# Patient Record
Sex: Male | Born: 1945 | Race: White | Hispanic: No | Marital: Married | Smoking: Former smoker
Health system: Southern US, Community
[De-identification: ages and names within clinical notes are randomized; demographics above are authoritative.]

## PROBLEM LIST (undated history)

## (undated) DIAGNOSIS — C61 Malignant neoplasm of prostate: Secondary | ICD-10-CM

## (undated) DIAGNOSIS — I959 Hypotension, unspecified: Secondary | ICD-10-CM

## (undated) DIAGNOSIS — R55 Syncope and collapse: Secondary | ICD-10-CM

## (undated) DIAGNOSIS — N289 Disorder of kidney and ureter, unspecified: Secondary | ICD-10-CM

## (undated) DIAGNOSIS — K900 Celiac disease: Secondary | ICD-10-CM

## (undated) DIAGNOSIS — E860 Dehydration: Secondary | ICD-10-CM

## (undated) DIAGNOSIS — S22089A Unspecified fracture of T11-T12 vertebra, initial encounter for closed fracture: Secondary | ICD-10-CM

## (undated) DIAGNOSIS — I272 Pulmonary hypertension, unspecified: Secondary | ICD-10-CM

## (undated) DIAGNOSIS — E785 Hyperlipidemia, unspecified: Secondary | ICD-10-CM

## (undated) DIAGNOSIS — E7800 Pure hypercholesterolemia, unspecified: Secondary | ICD-10-CM

## (undated) DIAGNOSIS — I10 Essential (primary) hypertension: Secondary | ICD-10-CM

## (undated) DIAGNOSIS — J45909 Unspecified asthma, uncomplicated: Secondary | ICD-10-CM

## (undated) DIAGNOSIS — S2242XA Multiple fractures of ribs, left side, initial encounter for closed fracture: Secondary | ICD-10-CM

## (undated) DIAGNOSIS — M4317 Spondylolisthesis, lumbosacral region: Secondary | ICD-10-CM

## (undated) DIAGNOSIS — R5383 Other fatigue: Secondary | ICD-10-CM

## (undated) DIAGNOSIS — M51369 Other intervertebral disc degeneration, lumbar region without mention of lumbar back pain or lower extremity pain: Secondary | ICD-10-CM

## (undated) DIAGNOSIS — S22009A Unspecified fracture of unspecified thoracic vertebra, initial encounter for closed fracture: Secondary | ICD-10-CM

## (undated) DIAGNOSIS — Z974 Presence of external hearing-aid: Secondary | ICD-10-CM

## (undated) DIAGNOSIS — M4316 Spondylolisthesis, lumbar region: Secondary | ICD-10-CM

## (undated) DIAGNOSIS — M5416 Radiculopathy, lumbar region: Secondary | ICD-10-CM

## (undated) DIAGNOSIS — R972 Elevated prostate specific antigen [PSA]: Secondary | ICD-10-CM

## (undated) DIAGNOSIS — G709 Myoneural disorder, unspecified: Secondary | ICD-10-CM

## (undated) DIAGNOSIS — I5189 Other ill-defined heart diseases: Secondary | ICD-10-CM

## (undated) DIAGNOSIS — I519 Heart disease, unspecified: Secondary | ICD-10-CM

## (undated) DIAGNOSIS — Z87442 Personal history of urinary calculi: Secondary | ICD-10-CM

## (undated) DIAGNOSIS — K219 Gastro-esophageal reflux disease without esophagitis: Secondary | ICD-10-CM

## (undated) DIAGNOSIS — S3992XA Unspecified injury of lower back, initial encounter: Secondary | ICD-10-CM

## (undated) DIAGNOSIS — M5136 Other intervertebral disc degeneration, lumbar region: Secondary | ICD-10-CM

## (undated) HISTORY — PX: COLONOSCOPY: SHX174

## (undated) HISTORY — PX: HERNIA REPAIR: SHX51

---
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
## 1999-11-14 ENCOUNTER — Encounter: Admission: RE | Admit: 1999-11-14 | Discharge: 1999-11-14 | Payer: Self-pay | Admitting: Family Medicine

## 1999-11-14 ENCOUNTER — Encounter: Payer: Self-pay | Admitting: Family Medicine

## 2000-12-19 ENCOUNTER — Encounter: Payer: Self-pay | Admitting: *Deleted

## 2000-12-19 ENCOUNTER — Encounter (INDEPENDENT_AMBULATORY_CARE_PROVIDER_SITE_OTHER): Payer: Self-pay | Admitting: Specialist

## 2000-12-19 ENCOUNTER — Ambulatory Visit (HOSPITAL_COMMUNITY): Admission: RE | Admit: 2000-12-19 | Discharge: 2000-12-19 | Payer: Self-pay | Admitting: *Deleted

## 2003-01-04 ENCOUNTER — Ambulatory Visit (HOSPITAL_COMMUNITY): Admission: RE | Admit: 2003-01-04 | Discharge: 2003-01-04 | Payer: Self-pay | Admitting: *Deleted

## 2004-05-14 HISTORY — PX: BACK SURGERY: SHX140

## 2005-06-29 ENCOUNTER — Encounter: Admission: RE | Admit: 2005-06-29 | Discharge: 2005-06-29 | Payer: Self-pay | Admitting: Family Medicine

## 2005-08-10 ENCOUNTER — Encounter: Admission: RE | Admit: 2005-08-10 | Discharge: 2005-08-10 | Payer: Self-pay | Admitting: Neurosurgery

## 2005-08-31 ENCOUNTER — Encounter: Admission: RE | Admit: 2005-08-31 | Discharge: 2005-08-31 | Payer: Self-pay | Admitting: Neurosurgery

## 2005-09-28 ENCOUNTER — Observation Stay (HOSPITAL_COMMUNITY): Admission: RE | Admit: 2005-09-28 | Discharge: 2005-09-29 | Payer: Self-pay | Admitting: Neurosurgery

## 2006-11-12 ENCOUNTER — Ambulatory Visit: Payer: Self-pay | Admitting: Vascular Surgery

## 2007-01-09 ENCOUNTER — Ambulatory Visit: Payer: Self-pay | Admitting: *Deleted

## 2007-01-29 ENCOUNTER — Encounter: Admission: RE | Admit: 2007-01-29 | Discharge: 2007-01-29 | Payer: Self-pay | Admitting: Gastroenterology

## 2007-08-01 ENCOUNTER — Emergency Department (HOSPITAL_COMMUNITY): Admission: EM | Admit: 2007-08-01 | Discharge: 2007-08-01 | Payer: Self-pay | Admitting: Emergency Medicine

## 2008-08-28 ENCOUNTER — Encounter: Admission: RE | Admit: 2008-08-28 | Discharge: 2008-08-28 | Payer: Self-pay | Admitting: Family Medicine

## 2010-09-26 NOTE — Procedures (Signed)
CAROTID DUPLEX EXAM   INDICATION:  Follow-up evaluation of known carotid artery disease.   HISTORY:  Diabetes:  No.  Cardiac:  No.  Hypertension:  No.  Smoking:  Quit in 1971.  Previous Surgery:  No.  CV History:  Left carotid bruit.  Amaurosis Fugax:  No.  Paresthesias:  No.  Hemiparesis:  No.                                       RIGHT             LEFT  Brachial systolic pressure:         132               126  Brachial Doppler waveforms:         Triphasic         Triphasic  Vertebral direction of flow:        Antegrade         Antegrade  DUPLEX VELOCITIES (cm/sec)  CCA peak systolic                   79                78  ECA peak systolic                   146               467  ICA peak systolic                   89                103  ICA end diastolic                   35                42  PLAQUE MORPHOLOGY:                  Calcified         Calcified  PLAQUE AMOUNT:                      Mild to moderate  Severe  PLAQUE LOCATION:                    Proximal ICA      Proximal ECA   IMPRESSION:  1. Left external carotid artery stenosis.  2. There was 20%-39% right internal carotid artery stenosis.  3. There was 40%-59% left internal carotid artery stenosis.   ___________________________________________  P. Liliane Bade, M.D.   MC/MEDQ  D:  11/12/2006  T:  11/13/2006  Job:  [id number]

## 2010-09-26 NOTE — Consult Note (Signed)
NEW PATIENT CONSULTATION   Cox, Alexander VILLENA  DOB:  07-31-1945                                       01/09/2007  CHART#:[id number]   REASON FOR CONSULTATION:  Extracranial cerebrovascular occlusive  disease.   HISTORY:  Alexander Cox is a 65 year old male, who underwent a  carotid Doppler evaluation for followup of known carotid artery  occlusive disease, carried out November 12, 2006.  He states that three or  four years ago, he had some episodes of numbness on the right side of  his face.  Carotid Doppler at that time did reveal some plaque.  He was  placed on aspirin daily.  He has had no further episodes.  Underwent  follow-up Doppler evaluation recently.   No history of stroke.  Denies sensory or motor deficit.  No visual  disturbance.   Carotid Doppler evaluation carried out at [facility] office reveals a mild 20%  to 39% right ICA stenosis and moderate 40% to 59% left ICA stenosis.  Mild plaque was noted in the right carotid bifurcation, and severe  calcified plaque in the left carotid bifurcation.  Did have a high-grade  left external carotid stenosis.   PAST MEDICAL HISTORY:  1. BPH.  2. Abnormal liver function tests.   MEDICATIONS:  Aspirin 325 mg daily.   ALLERGIES:  None known.   FAMILY HISTORY:  Mother died, age 73, of abdominal cancer.  Father died  at age 40 with Alzheimer's disease.  Two siblings living, age 56 and 16,  who are generally well.   SOCIAL HISTORY:  The patient is married with two grown children.  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  Does not  use tobacco.  Discontinued cigarettes 30 years ago.  No alcohol intake.   REVIEW OF SYSTEMS:  Denies weight loss or anorexia.  No chest pain or  shortness of breath.  Denies cough or sputum production.  No change in  bowel habits.  He does have some mild urinary frequency.  Notes some  joint discomfort and dizziness.   PHYSICAL EXAMINATION:  GENERAL:   Well-appearing 65 year old male.  VITAL SIGNS:  BP 111/72, left arm, 113/70 right arm.  Pulse 78 per  minutes, respirations 18 per minute.  HEENT:  Mouth and throat are clear.  Normocephalic.  Extraocular  movements intact.  NECK:  Supple, no thyromegaly or adenopathy.  CHEST:  Equal air entry bilaterally without rales or rhonchi.  CARDIOVASCULAR:  Left carotid bruits.  HEART:  Sounds are normal without murmurs.  ABDOMEN:  Soft, nontender.  LOWER EXTREMITIES:  No ankle edema.  NEUROLOGIC:  Cranial nerves intact.  Strength equal bilaterally.  Reflexes 2+.   IMPRESSION:  1. Mild to moderate asymptomatic bilateral carotid disease.  2. Benign prostatic hypertrophy.  3. Abnormal liver function tests.   RECOMMENDATIONS:  Continue aspirin 325 mg daily.  Return in 6 months for  follow-up carotid Doppler evaluation.   Balinda Quails, M.D.  Electronically Signed   [facility]/MEDQ  D:  01/09/2007  T:  01/11/2007  Job:  [id number]

## 2010-09-29 NOTE — Op Note (Signed)
NAME:  [name], [name] NO.:  [id number]   MEDICAL RECORD NO.:  [id number]          PATIENT TYPE:  INP   LOCATION:  2899                         FACILITY:  [facility]   PHYSICIAN:  Coletta Memos, M.D.     DATE OF BIRTH:  08-15-1945   DATE OF PROCEDURE:  09/28/2005  DATE OF DISCHARGE:                                 OPERATIVE REPORT   PREOPERATIVE DIAGNOSIS:  1.  Left L3 left L4 radiculopathies.  2.  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   POSTOPERATIVE DIAGNOSES:  1.  Left L3 left L4 radiculopathies.  2.  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   PROCEDURE:  Far lateral foraminotomies via superior facetectomies at L3-L4  and at L4-L5 on the left side with microscopic dissection.   COMPLICATIONS:  None.   SURGEON:  Coletta Memos, M.D.   ASSISTANT:  Stefani Dama, M.D.   ANESTHESIA:  General endotracheal.   INDICATIONS:  Alexander Cox is a 65 year old who had pain in the left lower  extremity.  MRI revealed lateral recess and foraminal stenosis at L3-L4 and  at L4-L5, both on the left side.  I, therefore, recommended and he agreed to  undergo operative decompression.  He is admitted today for said operation.   OPERATIVE NOTE:  Alexander Cox was brought to the operating room, intubated,  and placed under general anesthetic without difficulty.  He was rolled prone  onto a Wilson frame and all pressure points were properly padded.  His back  was prepped and he was draped in a sterile fashion using DuraPrep.  I  infiltrated 20 mL 0.5% lidocaine with 1:200,000 epinephrine into the lumbar  region and the paraspinous musculature on the left side.  Using a  preoperative localizing film, I then opened the skin with a #10 blade and  took this down to the thoracolumbar fascia.  I exposed lamina of L2, L3, and  L4.  I placed a double ended ganglion knife underneath the lamina of L3.  X-  ray showed that I was in the L3-L4 interlaminar space.  I then  moved up to  approach the pars interarticularis of L3.  I placed a self-retaining  retractor and identified the pars.  Then I used a high-speed drill to drill  out the lateral portion of the pars and performed a superior facetectomy on  the left side at L3-L4.  I brought the microscope into the operative field  and then removed ligamentum flavum.  I was able to identified the L3 nerve  root.  With Dr. Verlee Rossetti assistance, we then decompressed the L3 nerve root  using Kerrison punches and a high speed drill to remove what was very  thickened ligamentum flavum and also removed bony protuberances from the  facet at L3-L4.  The disk space was also identified.  I did not enter the  disk space as the nerve root was well decompressed after removing the bone  and ligament.  The pedicle was also easily identified and the nerve root was  clearly free in its egress from the neural foramen on  left side.   I then turned my attention to the L4 nerve root and found the pars  articularis of the L4 vertebral body on the left side.  I then used a high  speed drill and we drilled out the lateral portion and also the facette  superiorly there.  I removed bone and ligament until I was able to identify  the nerve root with microscopic dissection.  I then drilled a little bit  more into the pars to remove what was very thickened ligamentum flavum  compressing the L4 nerve root.  I then removed that with a Kerrison punch  and also removed the superior facet with the Kerrison punch.  After  thoroughly decompressing the nerve root with Dr. Verlee Rossetti assistance, I  irrigated.  Hemostasis was obtained.  I then closed the wound in a layered  fashion using Vicryl sutures to reapproximate the thoracolumbar fascia,  subcutaneous tissue and subcuticular layers.  Dermabond was used for a  sterile dressing.  The patient tolerated procedure well.           ______________________________  Coletta Memos, M.D.     [facility]/MEDQ   D:  09/28/2005  T:  09/28/2005  Job:  [id number]

## 2011-02-05 LAB — CBC
Platelets: 282
RBC: 5.04
RDW: 18.7 — ABNORMAL HIGH
WBC: 12.1 — ABNORMAL HIGH

## 2011-02-05 LAB — URINE MICROSCOPIC-ADD ON

## 2011-02-05 LAB — URINALYSIS, ROUTINE W REFLEX MICROSCOPIC
Bilirubin Urine: NEGATIVE
Glucose, UA: NEGATIVE
Ketones, ur: NEGATIVE
Nitrite: NEGATIVE
Protein, ur: NEGATIVE

## 2011-02-05 LAB — COMPREHENSIVE METABOLIC PANEL
ALT: 30
Alkaline Phosphatase: 113
Calcium: 8.7
Creatinine, Ser: 1.46
GFR calc Af Amer: 44 — ABNORMAL LOW
Potassium: 3.8
Total Bilirubin: 0.4

## 2011-02-05 LAB — LIPASE, BLOOD: Lipase: 22

## 2011-02-05 LAB — DIFFERENTIAL
Basophils Absolute: 0.1
Eosinophils Relative: 1
Lymphocytes Relative: 12
Lymphs Abs: 1.5
Monocytes Relative: 6
Neutrophils Relative %: 79 — ABNORMAL HIGH

## 2011-10-09 ENCOUNTER — Other Ambulatory Visit: Payer: Self-pay | Admitting: Family Medicine

## 2011-10-12 ENCOUNTER — Ambulatory Visit
Admission: RE | Admit: 2011-10-12 | Discharge: 2011-10-12 | Disposition: A | Discharge status: Home / Self Care | Payer: Medicare Other | Source: Ambulatory Visit | Attending: Family Medicine | Admitting: Family Medicine

## 2012-05-14 DIAGNOSIS — S3992XA Unspecified injury of lower back, initial encounter: Secondary | ICD-10-CM

## 2012-05-14 HISTORY — DX: Unspecified injury of lower back, initial encounter: S39.92XA

## 2013-06-25 ENCOUNTER — Encounter (HOSPITAL_COMMUNITY): Payer: Self-pay | Admitting: Emergency Medicine

## 2013-06-25 DIAGNOSIS — Z79899 Other long term (current) drug therapy: Secondary | ICD-10-CM | POA: Insufficient documentation

## 2013-06-25 DIAGNOSIS — I10 Essential (primary) hypertension: Secondary | ICD-10-CM | POA: Insufficient documentation

## 2013-06-25 DIAGNOSIS — R51 Headache: Secondary | ICD-10-CM | POA: Insufficient documentation

## 2013-06-25 DIAGNOSIS — Z7982 Long term (current) use of aspirin: Secondary | ICD-10-CM | POA: Insufficient documentation

## 2013-06-25 DIAGNOSIS — Z8719 Personal history of other diseases of the digestive system: Secondary | ICD-10-CM | POA: Insufficient documentation

## 2013-06-25 LAB — CBC WITH DIFFERENTIAL/PLATELET
Basophils Absolute: 0 10*3/uL (ref 0.0–0.1)
Basophils Relative: 0 % (ref 0–1)
EOS ABS: 0.2 10*3/uL (ref 0.0–0.7)
EOS PCT: 3 % (ref 0–5)
HCT: 43.6 % (ref 39.0–52.0)
Hemoglobin: 14.9 g/dL (ref 13.0–17.0)
LYMPHS ABS: 2.8 10*3/uL (ref 0.7–4.0)
LYMPHS PCT: 35 % (ref 12–46)
MCH: 30.7 pg (ref 26.0–34.0)
MCHC: 34.2 g/dL (ref 30.0–36.0)
MCV: 89.7 fL (ref 78.0–100.0)
MONOS PCT: 7 % (ref 3–12)
Monocytes Absolute: 0.5 10*3/uL (ref 0.1–1.0)
NEUTROS PCT: 55 % (ref 43–77)
Neutro Abs: 4.4 10*3/uL (ref 1.7–7.7)
PLATELETS: 226 10*3/uL (ref 150–400)
RBC: 4.86 MIL/uL (ref 4.22–5.81)
RDW: 12.7 % (ref 11.5–15.5)
WBC: 8 10*3/uL (ref 4.0–10.5)

## 2013-06-25 LAB — COMPREHENSIVE METABOLIC PANEL
ALT: 24 U/L (ref 0–53)
AST: 31 U/L (ref 0–37)
Albumin: 3.9 g/dL (ref 3.5–5.2)
Alkaline Phosphatase: 80 U/L (ref 39–117)
BUN: 20 mg/dL (ref 6–23)
CALCIUM: 9 mg/dL (ref 8.4–10.5)
CHLORIDE: 103 meq/L (ref 96–112)
CO2: 27 meq/L (ref 19–32)
CREATININE: 1.35 mg/dL (ref 0.50–1.35)
GFR calc Af Amer: 62 mL/min — ABNORMAL LOW (ref >90)
GFR calc non Af Amer: 53 mL/min — ABNORMAL LOW (ref >90)
Glucose, Bld: 93 mg/dL (ref 70–99)
POTASSIUM: 3.9 meq/L (ref 3.7–5.3)
Sodium: 144 mEq/L (ref 137–147)
Total Bilirubin: 0.2 mg/dL — ABNORMAL LOW (ref 0.3–1.2)
Total Protein: 6.9 g/dL (ref 6.0–8.3)

## 2013-06-25 NOTE — ED Notes (Signed)
Pt. reports elevated blood pressure at home this evening 190/105 with mild headache . No nausea or blurred vision . Currently taking antihypertensive medications .

## 2013-06-26 ENCOUNTER — Emergency Department (HOSPITAL_COMMUNITY)
Admission: EM | Admit: 2013-06-26 | Discharge: 2013-06-26 | Disposition: A | Discharge status: Home / Self Care | Payer: Medicare Other | Attending: Emergency Medicine | Admitting: Emergency Medicine

## 2013-06-26 DIAGNOSIS — I10 Essential (primary) hypertension: Secondary | ICD-10-CM

## 2013-06-26 HISTORY — DX: Celiac disease: K90.0

## 2013-06-26 HISTORY — DX: Essential (primary) hypertension: I10

## 2013-06-26 MED ORDER — ONDANSETRON HCL 4 MG/2ML IJ SOLN
4.0000 mg | Freq: Once | INTRAMUSCULAR | Status: AC
  Administered 2013-06-26: 4 mg via INTRAVENOUS
  Filled 2013-06-26: qty 2

## 2013-06-26 MED ORDER — FENTANYL CITRATE 0.05 MG/ML IJ SOLN
50.0000 ug | INTRAMUSCULAR | Status: DC | PRN
  Administered 2013-06-26: 50 ug via INTRAVENOUS
  Filled 2013-06-26: qty 2

## 2013-06-26 NOTE — ED Notes (Signed)
EKG handed to Dr. Marnette Burgess.

## 2013-06-26 NOTE — Discharge Instructions (Signed)
Arterial Hypertension °Arterial hypertension (high blood pressure) is a condition of elevated pressure in your blood vessels. Hypertension over a long period of time is a risk factor for strokes, heart attacks, and heart failure. It is also the leading cause of kidney (renal) failure.  °CAUSES  °· In Adults -- Over 90% of all hypertension has no known cause. This is called essential or primary hypertension. In the other 10% of people with hypertension, the increase in blood pressure is caused by another disorder. This is called secondary hypertension. Important causes of secondary hypertension are: °· Heavy alcohol use. °· Obstructive sleep apnea. °· Hyperaldosterosim (Conn's syndrome). °· Steroid use. °· Chronic kidney failure. °· Hyperparathyroidism. °· Medications. °· Renal artery stenosis. °· Pheochromocytoma. °· Cushing's disease. °· Coarctation of the aorta. °· Scleroderma renal crisis. °· Licorice (in excessive amounts). °· Drugs (cocaine, methamphetamine). °Your caregiver can explain any items above that apply to you. °· In Children -- Secondary hypertension is more common and should always be considered. °· Pregnancy -- Few women of childbearing age have high blood pressure. However, up to 10% of them develop hypertension of pregnancy. Generally, this will not harm the woman. It may be a sign of 3 complications of pregnancy: preeclampsia, HELLP syndrome, and eclampsia. Follow up and control with medication is necessary. °SYMPTOMS  °· This condition normally does not produce any noticeable symptoms. It is usually found during a routine exam. °· Malignant hypertension is a late problem of high blood pressure. It may have the following symptoms: °· Headaches. °· Blurred vision. °· End-organ damage (this means your kidneys, heart, lungs, and other organs are being damaged). °· Stressful situations can increase the blood pressure. If a person with normal blood pressure has their blood pressure go up while being  seen by their caregiver, this is often termed "white coat hypertension." Its importance is not known. It may be related with eventually developing hypertension or complications of hypertension. °· Hypertension is often confused with mental tension, stress, and anxiety. °DIAGNOSIS  °The diagnosis is made by 3 separate blood pressure measurements. They are taken at least 1 week apart from each other. If there is organ damage from hypertension, the diagnosis may be made without repeat measurements. °Hypertension is usually identified by having blood pressure readings: °· Above 140/90 mmHg measured in both arms, at 3 separate times, over a couple weeks. °· Over 130/80 mmHg should be considered a risk factor and may require treatment in patients with diabetes. °Blood pressure readings over 120/80 mmHg are called "pre-hypertension" even in non-diabetic patients. °To get a true blood pressure measurement, use the following guidelines. Be aware of the factors that can alter blood pressure readings. °· Take measurements at least 1 hour after caffeine. °· Take measurements 30 minutes after smoking and without any stress. This is another reason to quit smoking  it raises your blood pressure. °· Use a proper cuff size. Ask your caregiver if you are not sure about your cuff size. °· Most home blood pressure cuffs are automatic. They will measure systolic and diastolic pressures. The systolic pressure is the pressure reading at the start of sounds. Diastolic pressure is the pressure at which the sounds disappear. If you are elderly, measure pressures in multiple postures. Try sitting, lying or standing. °· Sit at rest for a minimum of 5 minutes before taking measurements. °· You should not be on any medications like decongestants. These are found in many cold medications. °· Record your blood pressure readings and review   them with your caregiver. °If you have hypertension: °· Your caregiver may do tests to be sure you do not have  secondary hypertension (see "causes" above). °· Your caregiver may also look for signs of metabolic syndrome. This is also called Syndrome X or Insulin Resistance Syndrome. You may have this syndrome if you have type 2 diabetes, abdominal obesity, and abnormal blood lipids in addition to hypertension. °· Your caregiver will take your medical and family history and perform a physical exam. °· Diagnostic tests may include blood tests (for glucose, cholesterol, potassium, and kidney function), a urinalysis, or an EKG. Other tests may also be necessary depending on your condition. °PREVENTION  °There are important lifestyle issues that you can adopt to reduce your chance of developing hypertension: °· Maintain a normal weight. °· Limit the amount of salt (sodium) in your diet. °· Exercise often. °· Limit alcohol intake. °· Get enough potassium in your diet. Discuss specific advice with your caregiver. °· Follow a DASH diet (dietary approaches to stop hypertension). This diet is rich in fruits, vegetables, and low-fat dairy products, and avoids certain fats. °PROGNOSIS  °Essential hypertension cannot be cured. Lifestyle changes and medical treatment can lower blood pressure and reduce complications. The prognosis of secondary hypertension depends on the underlying cause. Many people whose hypertension is controlled with medicine or lifestyle changes can live a normal, healthy life.  °RISKS AND COMPLICATIONS  °While high blood pressure alone is not an illness, it often requires treatment due to its short- and long-term effects on many organs. Hypertension increases your risk for: °· CVAs or strokes (cerebrovascular accident). °· Heart failure due to chronically high blood pressure (hypertensive cardiomyopathy). °· Heart attack (myocardial infarction). °· Damage to the retina (hypertensive retinopathy). °· Kidney failure (hypertensive nephropathy). °Your caregiver can explain list items above that apply to you. Treatment  of hypertension can significantly reduce the risk of complications. °TREATMENT  °· For overweight patients, weight loss and regular exercise are recommended. Physical fitness lowers blood pressure. °· Mild hypertension is usually treated with diet and exercise. A diet rich in fruits and vegetables, fat-free dairy products, and foods low in fat and salt (sodium) can help lower blood pressure. Decreasing salt intake decreases blood pressure in a 1/3 of people. °· Stop smoking if you are a smoker. °The steps above are highly effective in reducing blood pressure. While these actions are easy to suggest, they are difficult to achieve. Most patients with moderate or severe hypertension end up requiring medications to bring their blood pressure down to a normal level. There are several classes of medications for treatment. Blood pressure pills (antihypertensives) will lower blood pressure by their different actions. Lowering the blood pressure by 10 mmHg may decrease the risk of complications by as much as 25%. °The goal of treatment is effective blood pressure control. This will reduce your risk for complications. Your caregiver will help you determine the best treatment for you according to your lifestyle. What is excellent treatment for one person, may not be for you. °HOME CARE INSTRUCTIONS  °· Do not smoke. °· Follow the lifestyle changes outlined in the "Prevention" section. °· If you are on medications, follow the directions carefully. Blood pressure medications must be taken as prescribed. Skipping doses reduces their benefit. It also puts you at risk for problems. °· Follow up with your caregiver, as directed. °· If you are asked to monitor your blood pressure at home, follow the guidelines in the "Diagnosis" section above. °SEEK MEDICAL CARE   IF:   You think you are having medication side effects.  You have recurrent headaches or lightheadedness.  You have swelling in your ankles.  You have trouble with  your vision. SEEK IMMEDIATE MEDICAL CARE IF:   You have sudden onset of chest pain or pressure, difficulty breathing, or other symptoms of a heart attack.  You have a severe headache.  You have symptoms of a stroke (such as sudden weakness, difficulty speaking, difficulty walking). MAKE SURE YOU:   Understand these instructions.  Will watch your condition. DASH Diet The DASH diet stands for "Dietary Approaches to Stop Hypertension." It is a healthy eating plan that has been shown to reduce high blood pressure (hypertension) in as little as 14 days, while also possibly providing other significant health benefits. These other health benefits include reducing the risk of breast cancer after menopause and reducing the risk of type 2 diabetes, heart disease, colon cancer, and stroke. Health benefits also include weight loss and slowing kidney failure in patients with chronic kidney disease.  DIET GUIDELINES Limit salt (sodium). Your diet should contain less than 1500 mg of sodium daily. Limit refined or processed carbohydrates. Your diet should include mostly whole grains. Desserts and added sugars should be used sparingly. Include small amounts of heart-healthy fats. These types of fats include nuts, oils, and tub margarine. Limit saturated and trans fats. These fats have been shown to be harmful in the body. CHOOSING FOODS  The following food groups are based on a 2000 calorie diet. See your Registered Dietitian for individual calorie needs. Grains and Grain Products (6 to 8 servings daily) Eat More Often: Whole-wheat bread, brown rice, whole-grain or wheat pasta, quinoa, popcorn without added fat or salt (air popped). Eat Less Often: White bread, white pasta, white rice, cornbread. Vegetables (4 to 5 servings daily) Eat More Often: Fresh, frozen, and canned vegetables. Vegetables may be raw, steamed, roasted, or grilled with a minimal amount of fat. Eat Less Often/Avoid: Creamed or fried  vegetables. Vegetables in a cheese sauce. Fruit (4 to 5 servings daily) Eat More Often: All fresh, canned (in natural juice), or frozen fruits. Dried fruits without added sugar. One hundred percent fruit juice ( cup [237 mL] daily). Eat Less Often: Dried fruits with added sugar. Canned fruit in light or heavy syrup. [employer], Fish, and Poultry (2 servings or less daily. One serving is 3 to 4 oz [85-114 g]). Eat More Often: Ninety percent or leaner ground beef, tenderloin, sirloin. Round cuts of beef, chicken breast, [place] breast. All fish. Grill, bake, or broil your meat. Nothing should be fried. Eat Less Often/Avoid: Fatty cuts of meat, [place], or chicken leg, thigh, or wing. Fried cuts of meat or fish. Dairy (2 to 3 servings) Eat More Often: Low-fat or fat-free milk, low-fat plain or light yogurt, reduced-fat or part-skim cheese. Eat Less Often/Avoid: Milk (whole, 2%).Whole milk yogurt. Full-fat cheeses. Nuts, Seeds, and Legumes (4 to 5 servings per week) Eat More Often: All without added salt. Eat Less Often/Avoid: Salted nuts and seeds, canned beans with added salt. Fats and Sweets (limited) Eat More Often: Vegetable oils, tub margarines without trans fats, sugar-free gelatin. Mayonnaise and salad dressings. Eat Less Often/Avoid: Coconut oils, palm oils, butter, stick margarine, cream, half and half, cookies, candy, pie. FOR MORE INFORMATION The Dash Diet Eating Plan: www.dashdiet.org Document Released: 04/19/2011 Document Revised: 07/23/2011 Document Reviewed: 04/19/2011 [facility] Patient Information 2014 [place], [place].   Will get help right away if you are not doing well  or get worse. Document Released: 04/30/2005 Document Revised: 07/23/2011 Document Reviewed: 11/28/2006 [facility] Patient Information 2014 [place].

## 2013-06-27 NOTE — ED Provider Notes (Signed)
CSN: [id number]     Arrival date & time 06/25/13  2208 History   First MD Initiated Contact with Patient 06/26/13 0210     Chief Complaint  Patient presents with  . Hypertension  . Headache     (Consider location/radiation/quality/duration/timing/severity/associated sxs/prior Treatment) HPI History provided by patient. History of hypertension, taking medications as prescribed, developed mild headache at home tonight and check his blood pressure. It persisted to be elevated and he continued to check it. Highest numbers were in the range of systolic 932/355. Headache described as frontal and not radiating, pressure-like. moderate in severity. No associated weakness, numbness, speech difficulty, change in vision, trouble walking. No chest pain, shortness of breath, abdominal pain, nausea, vomiting. No difficulty urinating. No recent change in medications.  Past Medical History  Diagnosis Date  . Hypertension   . Celiac disease    History reviewed. No pertinent past surgical history. No family history on file. History  Substance Use Topics  . Smoking status: Never Smoker   . Smokeless tobacco: Not on file  . Alcohol Use: No    Review of Systems  Constitutional: Negative for fever and chills.  Eyes: Negative for visual disturbance.  Respiratory: Negative for shortness of breath.   Cardiovascular: Negative for chest pain.  Gastrointestinal: Negative for vomiting and abdominal pain.  Genitourinary: Negative for flank pain and decreased urine volume.  Musculoskeletal: Negative for back pain, neck pain and neck stiffness.  Skin: Negative for rash.  Neurological: Negative for headaches.  All other systems reviewed and are negative.      Allergies  Review of patient's allergies indicates no known allergies.  Home Medications   Current Outpatient Rx  Name  Route  Sig  Dispense  Refill  . aspirin EC 81 MG tablet   Oral   Take 81 mg by mouth daily.         . calcium  carbonate (OS-CAL) 600 MG TABS tablet   Oral   Take 600 mg by mouth daily with breakfast.         . cholecalciferol (VITAMIN D) 1000 UNITS tablet   Oral   Take 1,000 Units by mouth daily.         Marland Kitchen lisinopril (PRINIVIL,ZESTRIL) 20 MG tablet   Oral   Take 10 mg by mouth 2 (two) times daily.          . Multiple Vitamin (MULTIVITAMIN WITH MINERALS) TABS tablet   Oral   Take 1 tablet by mouth daily.         Marland Kitchen omega-3 acid ethyl esters (LOVAZA) 1 G capsule   Oral   Take 1 g by mouth daily.         . simvastatin (ZOCOR) 20 MG tablet   Oral   Take 20 mg by mouth daily.          BP 156/76  Pulse 54  Temp(Src) 98.2 F (36.8 C) (Oral)  Resp 11  Ht 5\' 7"  (1.702 m)  Wt 166 lb (75.297 kg)  BMI 25.99 kg/m2  SpO2 96% Physical Exam  Constitutional: He is oriented to person, place, and time. He appears well-developed and well-nourished.  HENT:  Head: Normocephalic and atraumatic.  Eyes: EOM are normal. Pupils are equal, round, and reactive to light.  Neck: Neck supple.  Cardiovascular: Normal rate, regular rhythm and intact distal pulses.   Pulmonary/Chest: Effort normal and breath sounds normal. No respiratory distress. He exhibits no tenderness.  Abdominal: Soft. Bowel sounds are normal. He exhibits  no distension. There is no tenderness.  Musculoskeletal: Normal range of motion. He exhibits no edema.  Neurological: He is alert and oriented to person, place, and time. He displays normal reflexes. No cranial nerve deficit. Coordination normal.  Speech clear, no facial droop, no pronator drift. Equal strengths: Grips, biceps, triceps, dorsi plantar flexion. Normal gait  Skin: Skin is warm and dry.    ED Course  Procedures (including critical care time) Labs Review Labs Reviewed  COMPREHENSIVE METABOLIC PANEL - Abnormal; Notable for the following:    Total Bilirubin 0.2 (*)    GFR calc non Af Amer 53 (*)    GFR calc Af Amer 62 (*)    All other components within  normal limits  CBC WITH DIFFERENTIAL   Imaging Review No results found.  EKG Interpretation    Date/Time:  Friday June 26 2013 02:55:03 EST Ventricular Rate:  50 PR Interval:  139 QRS Duration: 84 QT Interval:  488 QTC Calculation: 445 R Axis:   53 Text Interpretation:  Sinus bradycardia (less than 50 BPm) No significant change since last tracing Confirmed by Isley Weisheit  MD, Littleton Haub ([id number]) on 06/26/2013 4:18:51 AM           IV fentanyl provided for headache and cardiac monitoring  On recheck blood pressure improved. Headache resolved. Repeat exam unchanged.   Patient is comfortable with plan discharge home, take medications as prescribed and followup primary care physician for recheck blood pressure in the office. Hypertension precautions provided verbalized is understood. MDM   Final diagnoses:  Hypertension   Evaluated with EKG and labs reviewed as above, normal creatinine. Headache improved with IV fentanyl and blood pressure improving   Teressa Lower, MD 06/27/13 [phone]

## 2013-11-30 ENCOUNTER — Emergency Department (HOSPITAL_COMMUNITY): Payer: Medicare Other

## 2013-11-30 ENCOUNTER — Inpatient Hospital Stay (HOSPITAL_COMMUNITY)
Admission: EM | Admit: 2013-11-30 | Discharge: 2013-12-04 | DRG: 552 | Disposition: A | Discharge status: Rehabilitation Facility | Payer: Medicare Other | Attending: General Surgery | Admitting: General Surgery

## 2013-11-30 ENCOUNTER — Encounter (HOSPITAL_COMMUNITY): Payer: Self-pay | Admitting: Emergency Medicine

## 2013-11-30 DIAGNOSIS — N1831 Chronic kidney disease, stage 3a: Secondary | ICD-10-CM | POA: Diagnosis present

## 2013-11-30 DIAGNOSIS — I959 Hypotension, unspecified: Secondary | ICD-10-CM

## 2013-11-30 DIAGNOSIS — E785 Hyperlipidemia, unspecified: Secondary | ICD-10-CM | POA: Diagnosis present

## 2013-11-30 DIAGNOSIS — S22009A Unspecified fracture of unspecified thoracic vertebra, initial encounter for closed fracture: Secondary | ICD-10-CM | POA: Diagnosis not present

## 2013-11-30 DIAGNOSIS — N189 Chronic kidney disease, unspecified: Secondary | ICD-10-CM | POA: Diagnosis present

## 2013-11-30 DIAGNOSIS — N179 Acute kidney failure, unspecified: Secondary | ICD-10-CM | POA: Diagnosis present

## 2013-11-30 DIAGNOSIS — Z7982 Long term (current) use of aspirin: Secondary | ICD-10-CM

## 2013-11-30 DIAGNOSIS — S2242XA Multiple fractures of ribs, left side, initial encounter for closed fracture: Secondary | ICD-10-CM

## 2013-11-30 DIAGNOSIS — S22009B Unspecified fracture of unspecified thoracic vertebra, initial encounter for open fracture: Secondary | ICD-10-CM

## 2013-11-30 DIAGNOSIS — I129 Hypertensive chronic kidney disease with stage 1 through stage 4 chronic kidney disease, or unspecified chronic kidney disease: Secondary | ICD-10-CM | POA: Diagnosis present

## 2013-11-30 DIAGNOSIS — Z79899 Other long term (current) drug therapy: Secondary | ICD-10-CM | POA: Diagnosis not present

## 2013-11-30 DIAGNOSIS — S22089A Unspecified fracture of T11-T12 vertebra, initial encounter for closed fracture: Secondary | ICD-10-CM

## 2013-11-30 DIAGNOSIS — I10 Essential (primary) hypertension: Secondary | ICD-10-CM | POA: Diagnosis present

## 2013-11-30 DIAGNOSIS — S2249XA Multiple fractures of ribs, unspecified side, initial encounter for closed fracture: Secondary | ICD-10-CM

## 2013-11-30 DIAGNOSIS — Z87891 Personal history of nicotine dependence: Secondary | ICD-10-CM

## 2013-11-30 DIAGNOSIS — R55 Syncope and collapse: Secondary | ICD-10-CM

## 2013-11-30 DIAGNOSIS — E860 Dehydration: Secondary | ICD-10-CM | POA: Diagnosis present

## 2013-11-30 DIAGNOSIS — M549 Dorsalgia, unspecified: Secondary | ICD-10-CM | POA: Diagnosis not present

## 2013-11-30 DIAGNOSIS — D62 Acute posthemorrhagic anemia: Secondary | ICD-10-CM | POA: Diagnosis present

## 2013-11-30 DIAGNOSIS — M4310 Spondylolisthesis, site unspecified: Secondary | ICD-10-CM | POA: Diagnosis present

## 2013-11-30 DIAGNOSIS — W11XXXA Fall on and from ladder, initial encounter: Secondary | ICD-10-CM | POA: Diagnosis present

## 2013-11-30 DIAGNOSIS — I951 Orthostatic hypotension: Secondary | ICD-10-CM

## 2013-11-30 DIAGNOSIS — Z5189 Encounter for other specified aftercare: Secondary | ICD-10-CM | POA: Diagnosis not present

## 2013-11-30 DIAGNOSIS — N289 Disorder of kidney and ureter, unspecified: Secondary | ICD-10-CM | POA: Diagnosis present

## 2013-11-30 HISTORY — DX: Hypotension, unspecified: I95.9

## 2013-11-30 HISTORY — DX: Pure hypercholesterolemia, unspecified: E78.00

## 2013-11-30 HISTORY — DX: Syncope and collapse: R55

## 2013-11-30 HISTORY — DX: Unspecified fracture of t11-T12 vertebra, initial encounter for closed fracture: S22.089A

## 2013-11-30 LAB — CBC
HCT: 41.9 % (ref 39.0–52.0)
Hemoglobin: 13.9 g/dL (ref 13.0–17.0)
MCH: 30.6 pg (ref 26.0–34.0)
MCHC: 33.2 g/dL (ref 30.0–36.0)
MCV: 92.3 fL (ref 78.0–100.0)
PLATELETS: 263 10*3/uL (ref 150–400)
RBC: 4.54 MIL/uL (ref 4.22–5.81)
RDW: 12 % (ref 11.5–15.5)
WBC: 11.3 10*3/uL — AB (ref 4.0–10.5)

## 2013-11-30 LAB — SAMPLE TO BLOOD BANK

## 2013-11-30 LAB — COMPREHENSIVE METABOLIC PANEL
ALT: 37 U/L (ref 0–53)
ANION GAP: 17 — AB (ref 5–15)
AST: 32 U/L (ref 0–37)
Albumin: 3.8 g/dL (ref 3.5–5.2)
Alkaline Phosphatase: 65 U/L (ref 39–117)
BUN: 25 mg/dL — ABNORMAL HIGH (ref 6–23)
CALCIUM: 8.8 mg/dL (ref 8.4–10.5)
CO2: 22 mEq/L (ref 19–32)
CREATININE: 2.02 mg/dL — AB (ref 0.50–1.35)
Chloride: 102 mEq/L (ref 96–112)
GFR calc Af Amer: 38 mL/min — ABNORMAL LOW (ref >90)
GFR, EST NON AFRICAN AMERICAN: 32 mL/min — AB (ref >90)
Glucose, Bld: 143 mg/dL — ABNORMAL HIGH (ref 70–99)
Potassium: 4 mEq/L (ref 3.7–5.3)
Sodium: 141 mEq/L (ref 137–147)
Total Bilirubin: 0.4 mg/dL (ref 0.3–1.2)
Total Protein: 6.6 g/dL (ref 6.0–8.3)

## 2013-11-30 LAB — CDS SEROLOGY

## 2013-11-30 LAB — I-STAT CHEM 8, ED
BUN: 27 mg/dL — ABNORMAL HIGH (ref 6–23)
CALCIUM ION: 1.07 mmol/L — AB (ref 1.13–1.30)
Chloride: 104 mEq/L (ref 96–112)
Creatinine, Ser: 2.4 mg/dL — ABNORMAL HIGH (ref 0.50–1.35)
Glucose, Bld: 143 mg/dL — ABNORMAL HIGH (ref 70–99)
HCT: 42 % (ref 39.0–52.0)
HEMOGLOBIN: 14.3 g/dL (ref 13.0–17.0)
Potassium: 3.8 mEq/L (ref 3.7–5.3)
Sodium: 141 mEq/L (ref 137–147)
TCO2: 21 mmol/L (ref 0–100)

## 2013-11-30 LAB — PROTIME-INR
INR: 1.04 (ref 0.00–1.49)
PROTHROMBIN TIME: 13.6 s (ref 11.6–15.2)

## 2013-11-30 LAB — MRSA PCR SCREENING: MRSA by PCR: NEGATIVE

## 2013-11-30 LAB — ETHANOL

## 2013-11-30 LAB — I-STAT CG4 LACTIC ACID, ED: Lactic Acid, Venous: 3.66 mmol/L — ABNORMAL HIGH (ref 0.5–2.2)

## 2013-11-30 MED ORDER — POLYETHYLENE GLYCOL 3350 17 G PO PACK
17.0000 g | PACK | Freq: Every day | ORAL | Status: DC
  Administered 2013-12-03 – 2013-12-04 (×2): 17 g via ORAL
  Filled 2013-11-30 (×5): qty 1

## 2013-11-30 MED ORDER — PANTOPRAZOLE SODIUM 40 MG PO TBEC
40.0000 mg | DELAYED_RELEASE_TABLET | Freq: Every day | ORAL | Status: DC
  Administered 2013-11-30 – 2013-12-04 (×5): 40 mg via ORAL
  Filled 2013-11-30 (×5): qty 1

## 2013-11-30 MED ORDER — ONDANSETRON HCL 4 MG PO TABS: 4.0000 mg | ORAL_TABLET | Freq: Four times a day (QID) | ORAL | Status: DC | PRN

## 2013-11-30 MED ORDER — POTASSIUM CHLORIDE IN NACL 20-0.45 MEQ/L-% IV SOLN
INTRAVENOUS | Status: DC
  Administered 2013-11-30 – 2013-12-02 (×4): via INTRAVENOUS
  Filled 2013-11-30 (×10): qty 1000

## 2013-11-30 MED ORDER — HYDROMORPHONE HCL PF 1 MG/ML IJ SOLN
0.5000 mg | INTRAMUSCULAR | Status: DC | PRN
  Administered 2013-11-30 – 2013-12-01 (×2): 0.5 mg via INTRAVENOUS
  Filled 2013-11-30 (×2): qty 1

## 2013-11-30 MED ORDER — IOHEXOL 300 MG/ML  SOLN
100.0000 mL | Freq: Once | INTRAMUSCULAR | Status: AC | PRN
  Administered 2013-11-30: 100 mL via INTRAVENOUS

## 2013-11-30 MED ORDER — ONDANSETRON HCL 4 MG/2ML IJ SOLN: 4.0000 mg | Freq: Four times a day (QID) | INTRAMUSCULAR | Status: DC | PRN

## 2013-11-30 MED ORDER — ENOXAPARIN SODIUM 40 MG/0.4ML ~~LOC~~ SOLN: 40.0000 mg | SUBCUTANEOUS | Status: DC

## 2013-11-30 MED ORDER — HYDROMORPHONE HCL PF 1 MG/ML IJ SOLN
INTRAMUSCULAR | Status: AC
  Filled 2013-11-30: qty 1

## 2013-11-30 MED ORDER — DOCUSATE SODIUM 100 MG PO CAPS
100.0000 mg | ORAL_CAPSULE | Freq: Two times a day (BID) | ORAL | Status: DC
  Administered 2013-12-01 – 2013-12-04 (×7): 100 mg via ORAL
  Filled 2013-11-30 (×10): qty 1

## 2013-11-30 MED ORDER — OXYCODONE HCL 5 MG PO TABS
5.0000 mg | ORAL_TABLET | ORAL | Status: DC | PRN
  Administered 2013-11-30: 5 mg via ORAL
  Administered 2013-11-30 – 2013-12-01 (×3): 10 mg via ORAL
  Administered 2013-12-01: 5 mg via ORAL
  Administered 2013-12-02: 15 mg via ORAL
  Administered 2013-12-02: 10 mg via ORAL
  Administered 2013-12-03 (×4): 15 mg via ORAL
  Administered 2013-12-04 (×2): 10 mg via ORAL
  Filled 2013-11-30: qty 2
  Filled 2013-11-30 (×4): qty 3
  Filled 2013-11-30 (×3): qty 2
  Filled 2013-11-30: qty 1
  Filled 2013-11-30 (×2): qty 2
  Filled 2013-11-30: qty 3
  Filled 2013-11-30: qty 1

## 2013-11-30 MED ORDER — PANTOPRAZOLE SODIUM 40 MG IV SOLR
40.0000 mg | Freq: Every day | INTRAVENOUS | Status: DC
  Filled 2013-11-30: qty 40

## 2013-11-30 MED ORDER — FENTANYL CITRATE 0.05 MG/ML IJ SOLN
100.0000 ug | Freq: Once | INTRAMUSCULAR | Status: AC
  Administered 2013-11-30: 100 ug via INTRAVENOUS
  Filled 2013-11-30: qty 2

## 2013-11-30 NOTE — Consult Note (Signed)
Reason for Consult: T11 fracture Referring Physician: Trauma  Alexander Cox is an 68 y.o. male.  HPI: 68 year old male status post fall of approximately 20 feet off a ladder. Patient noted immediate onset of lower thoracic pain. Denies radiation or numbness initially however over the past 3 hours the patient has noted increasing numbness of both lower extremities. He denies any weakness. No prior history.  Past Medical History  Diagnosis Date  . Hypertension   . High cholesterol     History reviewed. No pertinent past surgical history.  Family History  Problem Relation Age of Onset  . Hypertension Paternal Grandfather     Social History:  reports that he has quit smoking. He uses smokeless tobacco. He reports that he does not drink alcohol or use illicit drugs.  Allergies: No Known Allergies  Medications: I have reviewed the patient's current medications.  Results for orders placed during the hospital encounter of 11/30/13 (from the past 48 hour(s))  PREPARE FRESH FROZEN PLASMA     Status: None   Collection Time    11/30/13 10:58 AM      Result Value Ref Range   Unit Number [id number]     Blood Component Type LIQ PLASMA     Unit division 00     Status of Unit REL FROM [facility]     Unit tag comment VERBAL ORDERS PER DR PICKERING     Transfusion Status OK TO TRANSFUSE     Unit Number [id number]     Blood Component Type LIQ PLASMA     Unit division 00     Status of Unit QUARANTINED     Unit tag comment VERBAL ORDERS PER DR PICKERING     Transfusion Status OK TO TRANSFUSE     Unit Number [id number]     Blood Component Type THAWED PLASMA     Unit division 00     Status of Unit REL FROM [facility]     Unit tag comment VERBAL ORDERS PER DR PICKERING     Transfusion Status OK TO TRANSFUSE    COMPREHENSIVE METABOLIC PANEL     Status: Abnormal   Collection Time    11/30/13 11:15 AM      Result Value Ref Range   Sodium 141  137 - 147 mEq/L   Potassium 4.0  3.7 - 5.3 mEq/L    Chloride 102  96 - 112 mEq/L   CO2 22  19 - 32 mEq/L   Glucose, Bld 143 (*) 70 - 99 mg/dL   BUN 25 (*) 6 - 23 mg/dL   Creatinine, Ser 2.02 (*) 0.50 - 1.35 mg/dL   Calcium 8.8  8.4 - 10.5 mg/dL   Total Protein 6.6  6.0 - 8.3 g/dL   Albumin 3.8  3.5 - 5.2 g/dL   AST 32  0 - 37 U/L   ALT 37  0 - 53 U/L   Alkaline Phosphatase 65  39 - 117 U/L   Total Bilirubin 0.4  0.3 - 1.2 mg/dL   GFR calc non Af Amer 32 (*) >90 mL/min   GFR calc Af Amer 38 (*) >90 mL/min   Comment: (NOTE)     The eGFR has been calculated using the CKD EPI equation.     This calculation has not been validated in all clinical situations.     eGFR's persistently <90 mL/min signify possible Chronic Kidney     Disease.   Anion gap 17 (*) 5 - 15  CBC  Status: Abnormal   Collection Time    11/30/13 11:15 AM      Result Value Ref Range   WBC 11.3 (*) 4.0 - 10.5 K/uL   RBC 4.54  4.22 - 5.81 MIL/uL   Hemoglobin 13.9  13.0 - 17.0 g/dL   HCT 41.9  39.0 - 52.0 %   MCV 92.3  78.0 - 100.0 fL   MCH 30.6  26.0 - 34.0 pg   MCHC 33.2  30.0 - 36.0 g/dL   RDW 12.0  11.5 - 15.5 %   Platelets 263  150 - 400 K/uL  ETHANOL     Status: None   Collection Time    11/30/13 11:15 AM      Result Value Ref Range   Alcohol, Ethyl (B) <11  0 - 11 mg/dL   Comment:            LOWEST DETECTABLE LIMIT FOR     SERUM ALCOHOL IS 11 mg/dL     FOR MEDICAL PURPOSES ONLY  PROTIME-INR     Status: None   Collection Time    11/30/13 11:15 AM      Result Value Ref Range   Prothrombin Time 13.6  11.6 - 15.2 seconds   INR 1.04  0.00 - 1.49  CDS SEROLOGY     Status: None   Collection Time    11/30/13 11:15 AM      Result Value Ref Range   CDS serology specimen       Value: SPECIMEN WILL BE HELD FOR 14 DAYS IF TESTING IS REQUIRED  I-STAT CG4 LACTIC ACID, ED     Status: Abnormal   Collection Time    11/30/13 11:20 AM      Result Value Ref Range   Lactic Acid, Venous 3.66 (*) 0.5 - 2.2 mmol/L  I-STAT CHEM 8, ED     Status: Abnormal    Collection Time    11/30/13 11:20 AM      Result Value Ref Range   Sodium 141  137 - 147 mEq/L   Potassium 3.8  3.7 - 5.3 mEq/L   Chloride 104  96 - 112 mEq/L   BUN 27 (*) 6 - 23 mg/dL   Creatinine, Ser 2.40 (*) 0.50 - 1.35 mg/dL   Glucose, Bld 143 (*) 70 - 99 mg/dL   Calcium, Ion 1.07 (*) 1.13 - 1.30 mmol/L   TCO2 21  0 - 100 mmol/L   Hemoglobin 14.3  13.0 - 17.0 g/dL   HCT 42.0  39.0 - 52.0 %  SAMPLE TO BLOOD BANK     Status: None   Collection Time    11/30/13 11:24 AM      Result Value Ref Range   Blood Bank Specimen SAMPLE AVAILABLE FOR TESTING     Sample Expiration 12/01/2013      Ct Head Wo Contrast  11/30/2013   CLINICAL DATA:  Syncope and subsequent fall from ladder  EXAM: CT HEAD WITHOUT CONTRAST  CT CERVICAL SPINE WITHOUT CONTRAST  TECHNIQUE: Multidetector CT imaging of the head and cervical spine was performed following the standard protocol without intravenous contrast. Multiplanar CT image reconstructions of the cervical spine were also generated.  COMPARISON:  None.  FINDINGS: CT HEAD FINDINGS  The ventricles are normal in size and position. There are basal ganglia calcifications bilaterally. There is faint cord plexus calcification in the temporal lobes. There is no intracranial hemorrhage nor acute ischemic event. There are mild white matter density changes consistent with  chronic small vessel ischemia. The cerebellum and brainstem are normal.  There is no acute skull fracture. The observed paranasal sinuses and mastoid air cells are clear.  CT CERVICAL SPINE FINDINGS  The cervical vertebral bodies are preserved in height. There is disc space narrowing at C2-3, C4-5, and C6-7. There are degenerative changes of the atlanto-dens articulation. The prevertebral soft tissues are normal. There is no perched facet nor facet or spinous process fracture. There are degenerative changes at multiple facet joint levels bilaterally. The bony ring at each cervical level is intact. The  pulmonary apices are clear. The observed portions of the first and second ribs are normal.  IMPRESSION: 1. There is no acute intracranial hemorrhage nor other acute intracranial abnormality. There are mild changes of chronic small vessel ischemia. 2. There is no acute skull fracture. 3. There is no acute cervical spine fracture nor dislocation. There is multilevel degenerative disc change.   Electronically Signed   By: David  Martinique   On: 11/30/2013 12:25   Ct Chest W Contrast  11/30/2013   CLINICAL DATA:  Status post fall.  EXAM: CT CHEST, ABDOMEN, AND PELVIS WITH CONTRAST  TECHNIQUE: Multidetector CT imaging of the chest, abdomen and pelvis was performed following the standard protocol during bolus administration of intravenous contrast.  CONTRAST:  189m OMNIPAQUE IOHEXOL 300 MG/ML SOLN1086mOMNIPAQUE IOHEXOL 300 MG/ML SOLN10031mMNIPAQUE IOHEXOL 300 MG/ML SOLN  COMPARISON:  None.  FINDINGS: CT CHEST FINDINGS  No evidence of trauma to the great vessels is identified. Heart size is normal. No pleural or pericardial effusion. Calcific aortic atherosclerosis is noted. A small hiatal hernia is seen. There is no pneumothorax. The patient has a T11 compression fracture with approximately 30% vertebral body height loss. There is also a transverse fracture through the lamina, facets and spinous process of T10 which is nondisplaced. Vertebral body alignment is maintained. Schmorl's nodes are noted.  CT ABDOMEN AND PELVIS FINDINGS  The gallbladder, liver, adrenal glands, spleen, pancreas and left kidney appear normal. Three nonobstructing right renal stones are identified measuring up to 0.7 cm. Right renal cyst is also noted. The small and large bowel and appendix appear normal. No focal bony abnormality is identified. Left L1 nondisplaced transverse process fracture is identified. Trace anterolisthesis of L5 on S1 due to facet arthropathy is noted.  IMPRESSION: Mild compression fracture of T11. There also appears to  be a fracture through the posterior elements of T10. Please see report of dedicated thoracic spine CT scan this same day. Nondisplaced left L1 transverse process is also seen. No other evidence of trauma is seen.  Atherosclerosis.  Nonobstructing right renal stones.  Small hiatal hernia.   Electronically Signed   By: ThoInge RiseD.   On: 11/30/2013 12:26   Ct Cervical Spine Wo Contrast  11/30/2013   CLINICAL DATA:  Syncope and subsequent fall from ladder  EXAM: CT HEAD WITHOUT CONTRAST  CT CERVICAL SPINE WITHOUT CONTRAST  TECHNIQUE: Multidetector CT imaging of the head and cervical spine was performed following the standard protocol without intravenous contrast. Multiplanar CT image reconstructions of the cervical spine were also generated.  COMPARISON:  None.  FINDINGS: CT HEAD FINDINGS  The ventricles are normal in size and position. There are basal ganglia calcifications bilaterally. There is faint cord plexus calcification in the temporal lobes. There is no intracranial hemorrhage nor acute ischemic event. There are mild white matter density changes consistent with chronic small vessel ischemia. The cerebellum and brainstem are normal.  There is  no acute skull fracture. The observed paranasal sinuses and mastoid air cells are clear.  CT CERVICAL SPINE FINDINGS  The cervical vertebral bodies are preserved in height. There is disc space narrowing at C2-3, C4-5, and C6-7. There are degenerative changes of the atlanto-dens articulation. The prevertebral soft tissues are normal. There is no perched facet nor facet or spinous process fracture. There are degenerative changes at multiple facet joint levels bilaterally. The bony ring at each cervical level is intact. The pulmonary apices are clear. The observed portions of the first and second ribs are normal.  IMPRESSION: 1. There is no acute intracranial hemorrhage nor other acute intracranial abnormality. There are mild changes of chronic small vessel  ischemia. 2. There is no acute skull fracture. 3. There is no acute cervical spine fracture nor dislocation. There is multilevel degenerative disc change.   Electronically Signed   By: David  Martinique   On: 11/30/2013 12:25   Ct Thoracic Spine Wo Contrast  11/30/2013   CLINICAL DATA:  Status post fall.  Back pain.  EXAM: CT THORACIC SPINE WITHOUT CONTRAST  TECHNIQUE: Multidetector CT imaging of the thoracic spine was performed without intravenous contrast administration. Multiplanar CT image reconstructions were also generated.  COMPARISON:  None.  FINDINGS: The patient has a mild comminuted compression fracture deformity of T11. Vertebral body height loss is estimated at 30%. There is minimal bony retropulsion off the posterior margin of the vertebral body but the central canal appears open. Best visualized on the sagittal images is a nondisplaced fracture extending through the inferior margin of the inferior facets of T10, laminae and spinous process. No other fracture is identified. Nondisplaced fracture of the left transverse process of L1 is also identified. Schmorl's nodes in the mid thoracic spine are noted. Vertebral body alignment is maintained.  IMPRESSION: Acute, mild compression fracture of T11 with minimal bony retropulsion. Nondisplaced fracture through the inferior T10 facet, lamina and spinous process is also identified.  Nondisplaced left L1 transverse process fracture.   Electronically Signed   By: Inge Rise M.D.   On: 11/30/2013 12:34   Ct Lumbar Spine Wo Contrast  11/30/2013   CLINICAL DATA:  Status post fall.  Back pain.  EXAM: CT LUMBAR SPINE WITHOUT CONTRAST  TECHNIQUE: Multidetector CT imaging of the lumbar spine was performed without intravenous contrast administration. Multiplanar CT image reconstructions were also generated.  COMPARISON:  None.  FINDINGS: Nondisplaced left L1 transverse process fracture is identified. No other fracture is seen with vertebral body height  maintained. Trace anterolisthesis L5 on S1 due to facet degenerative disease is identified. There is convex right scoliosis. Imaged paraspinous structures demonstrate right renal stones and atherosclerotic vascular disease.  IMPRESSION: Nondisplaced left transverse process fracture L1. No other acute abnormality.   Electronically Signed   By: Inge Rise M.D.   On: 11/30/2013 12:36   Ct Abdomen Pelvis W Contrast  11/30/2013   CLINICAL DATA:  Status post fall.  EXAM: CT CHEST, ABDOMEN, AND PELVIS WITH CONTRAST  TECHNIQUE: Multidetector CT imaging of the chest, abdomen and pelvis was performed following the standard protocol during bolus administration of intravenous contrast.  CONTRAST:  19m OMNIPAQUE IOHEXOL 300 MG/ML SOLN1012mOMNIPAQUE IOHEXOL 300 MG/ML SOLN10032mMNIPAQUE IOHEXOL 300 MG/ML SOLN  COMPARISON:  None.  FINDINGS: CT CHEST FINDINGS  No evidence of trauma to the great vessels is identified. Heart size is normal. No pleural or pericardial effusion. Calcific aortic atherosclerosis is noted. A small hiatal hernia is seen. There is no pneumothorax.  The patient has a T11 compression fracture with approximately 30% vertebral body height loss. There is also a transverse fracture through the lamina, facets and spinous process of T10 which is nondisplaced. Vertebral body alignment is maintained. Schmorl's nodes are noted.  CT ABDOMEN AND PELVIS FINDINGS  The gallbladder, liver, adrenal glands, spleen, pancreas and left kidney appear normal. Three nonobstructing right renal stones are identified measuring up to 0.7 cm. Right renal cyst is also noted. The small and large bowel and appendix appear normal. No focal bony abnormality is identified. Left L1 nondisplaced transverse process fracture is identified. Trace anterolisthesis of L5 on S1 due to facet arthropathy is noted.  IMPRESSION: Mild compression fracture of T11. There also appears to be a fracture through the posterior elements of T10. Please  see report of dedicated thoracic spine CT scan this same day. Nondisplaced left L1 transverse process is also seen. No other evidence of trauma is seen.  Atherosclerosis.  Nonobstructing right renal stones.  Small hiatal hernia.   Electronically Signed   By: Inge Rise M.D.   On: 11/30/2013 12:26   Dg Pelvis Portable  11/30/2013   CLINICAL DATA:  Status post fall from a ladder.  EXAM: PORTABLE PELVIS 1-2 VIEWS  COMPARISON:  None.  FINDINGS: No acute bony or joint abnormality is identified. Joint spaces are preserved. Soft tissue structures are unremarkable.  IMPRESSION: No acute finding.   Electronically Signed   By: Inge Rise M.D.   On: 11/30/2013 11:38   Dg Chest Port 1 View  11/30/2013   CLINICAL DATA:  Status post fall complaining of chest and rib pain  EXAM: PORTABLE CHEST - 1 VIEW  COMPARISON:  None.  FINDINGS: The lungs are adequately inflated. There is no pneumothorax or pleural effusion. The heart and mediastinal structures are within the limits of normal. There is a minimally displaced fracture of the anterior lateral aspect of the left eighth rib. An adjacent ninth rib fracture may be present.  IMPRESSION: There is no acute parenchymal abnormality of the lungs or mediastinal structures. There is fracture of the left eighth and possibly adjacent ninth ribs. A rib detail series is recommended.   Electronically Signed   By: David  Martinique   On: 11/30/2013 11:37    Pertinent items are noted in HPI. Blood pressure 87/51, pulse 72, temperature 97.4 F (36.3 C), temperature source Oral, resp. rate 17, height _0  (1.702 m), weight 70.9 kg (156 lb 4.9 oz), SpO2 100.00%. Patient is awake and alert. Oriented and appropriate. Cranial nerve function is intact. Speech is fluent. Motor examination of his upper extremity is normal. Motor examination of his lower extremities appears intact although limited somewhat by pain. Sensory examination reveals a relative sensory level around L1  bilaterally. The patient has intact proprioception. He has intact pressure and pain sensation but just feels a generalized numbness and diminishment in fine sensation. Reflexes are normal. Thoracic spine is significantly tender. Lumbar spine minimally so.  Assessment/Plan: Status post fall with resultant T11 compression fracture without evidence of retropulsion or significant canal compromise. Patient with a coexistent T10 laminar/facet fracture. Once again no evidence of canal compromise. Certainly patient's complaints of increasing numbness in his lower extremities are concerning. I would recommend we get an MRI scan of his thoracic and lumbar spine to evaluate. Continue with log roll for now. Patient likely to be able to be mobilized in a TLSO.  Janeth Terry A 11/30/2013, 2:22 PM

## 2013-11-30 NOTE — H&P (Signed)
Alexander Cox is an 68 y.o. male.   Chief Complaint: Fall HPI: Alexander Cox was up on a ladder about 20 feet when he fell. There was a positive loss of consciousness but he's unsure if it was a result of the fall or the cause of it. He has been having a problem with low blood pressure recently. He was originally a level 2 trauma but was hypotensive in the field and was upgraded to level 1. He was hypotensive on arrival with a SBP in the 80's. He complains of back pain.  Past Medical History  Diagnosis Date  . Hypertension   . High cholesterol     History reviewed. No pertinent past surgical history.  History reviewed. No pertinent family history. Social History:  reports that he has quit smoking. He uses smokeless tobacco. He reports that he does not drink alcohol or use illicit drugs.  Allergies: No Known Allergies  Results for orders placed during the hospital encounter of 11/30/13 (from the past 48 hour(s))  TYPE AND SCREEN     Status: None   Collection Time    11/30/13 10:58 AM      Result Value Ref Range   ABO/RH(D) PENDING     Antibody Screen PENDING     Sample Expiration 12/03/2013     Unit Number [id number]     Blood Component Type RED CELLS,LR     Unit division 00     Status of Unit REL FROM [facility]     Unit tag comment VERBAL ORDERS PER DR PICKERING     Transfusion Status OK TO TRANSFUSE     Crossmatch Result PENDING     Unit Number [id number]     Blood Component Type RED CELLS,LR     Unit division 00     Status of Unit REL FROM [facility]     Unit tag comment VERBAL ORDERS PER DR PICKERING     Transfusion Status OK TO TRANSFUSE     Crossmatch Result PENDING    PREPARE FRESH FROZEN PLASMA     Status: None   Collection Time    11/30/13 10:58 AM      Result Value Ref Range   Unit Number [id number]     Blood Component Type LIQ PLASMA     Unit division 00     Status of Unit REL FROM [facility]     Unit tag comment VERBAL ORDERS PER DR PICKERING     Transfusion Status OK  TO TRANSFUSE     Unit Number [id number]     Blood Component Type LIQ PLASMA     Unit division 00     Status of Unit QUARANTINED     Unit tag comment VERBAL ORDERS PER DR PICKERING     Transfusion Status OK TO TRANSFUSE     Unit Number [id number]     Blood Component Type THAWED PLASMA     Unit division 00     Status of Unit REL FROM [facility]     Unit tag comment VERBAL ORDERS PER DR PICKERING     Transfusion Status OK TO TRANSFUSE    CBC     Status: Abnormal   Collection Time    11/30/13 11:15 AM      Result Value Ref Range   WBC 11.3 (*) 4.0 - 10.5 K/uL   RBC 4.54  4.22 - 5.81 MIL/uL   Hemoglobin 13.9  13.0 - 17.0 g/dL   HCT 41.9  39.0 - 52.0 %  MCV 92.3  78.0 - 100.0 fL   MCH 30.6  26.0 - 34.0 pg   MCHC 33.2  30.0 - 36.0 g/dL   RDW 12.0  11.5 - 15.5 %   Platelets 263  150 - 400 K/uL  PROTIME-INR     Status: None   Collection Time    11/30/13 11:15 AM      Result Value Ref Range   Prothrombin Time 13.6  11.6 - 15.2 seconds   INR 1.04  0.00 - 1.49  I-STAT CG4 LACTIC ACID, ED     Status: Abnormal   Collection Time    11/30/13 11:20 AM      Result Value Ref Range   Lactic Acid, Venous 3.66 (*) 0.5 - 2.2 mmol/L  I-STAT CHEM 8, ED     Status: Abnormal   Collection Time    11/30/13 11:20 AM      Result Value Ref Range   Sodium 141  137 - 147 mEq/L   Potassium 3.8  3.7 - 5.3 mEq/L   Chloride 104  96 - 112 mEq/L   BUN 27 (*) 6 - 23 mg/dL   Creatinine, Ser 2.40 (*) 0.50 - 1.35 mg/dL   Glucose, Bld 143 (*) 70 - 99 mg/dL   Calcium, Ion 1.07 (*) 1.13 - 1.30 mmol/L   TCO2 21  0 - 100 mmol/L   Hemoglobin 14.3  13.0 - 17.0 g/dL   HCT 42.0  39.0 - 52.0 %   Dg Pelvis Portable  11/30/2013   CLINICAL DATA:  Status post fall from a ladder.  EXAM: PORTABLE PELVIS 1-2 VIEWS  COMPARISON:  None.  FINDINGS: No acute bony or joint abnormality is identified. Joint spaces are preserved. Soft tissue structures are unremarkable.  IMPRESSION: No acute finding.   Electronically Signed   By:  Inge Rise M.D.   On: 11/30/2013 11:38   Dg Chest Port 1 View  11/30/2013   CLINICAL DATA:  Status post fall complaining of chest and rib pain  EXAM: PORTABLE CHEST - 1 VIEW  COMPARISON:  None.  FINDINGS: The lungs are adequately inflated. There is no pneumothorax or pleural effusion. The heart and mediastinal structures are within the limits of normal. There is a minimally displaced fracture of the anterior lateral aspect of the left eighth rib. An adjacent ninth rib fracture may be present.  IMPRESSION: There is no acute parenchymal abnormality of the lungs or mediastinal structures. There is fracture of the left eighth and possibly adjacent ninth ribs. A rib detail series is recommended.   Electronically Signed   By: David  Martinique   On: 11/30/2013 11:37    Review of Systems  Musculoskeletal: Positive for back pain.  Neurological: Positive for loss of consciousness.  Psychiatric/Behavioral: Positive for memory loss.  All other systems reviewed and are negative.   Blood pressure 90/58, pulse 86, temperature 96.4 F (35.8 C), temperature source Temporal, resp. rate 18, height 5\' 7"  (1.702 m), weight 155 lb (70.308 kg), SpO2 93.00%. Physical Exam  Vitals reviewed. Constitutional: He is oriented to person, place, and time. He appears well-developed and well-nourished. He is cooperative. No distress. Cervical collar and nasal cannula in place.  HENT:  Head: Normocephalic and atraumatic. Head is without raccoon's eyes, without Battle's sign, without abrasion, without contusion and without laceration.  Right Ear: Hearing, tympanic membrane, external ear and ear canal normal. No lacerations. No drainage or tenderness. No foreign bodies. Tympanic membrane is not perforated. No hemotympanum.  Left Ear: Hearing,  tympanic membrane, external ear and ear canal normal. No lacerations. No drainage or tenderness. No foreign bodies. Tympanic membrane is not perforated. No hemotympanum.  Nose: Nose  normal. No nose lacerations, sinus tenderness, nasal deformity or nasal septal hematoma. No epistaxis.  Mouth/Throat: Uvula is midline, oropharynx is clear and moist and mucous membranes are normal. No lacerations. No oropharyngeal exudate.  Eyes: Conjunctivae, EOM and lids are normal. Pupils are equal, round, and reactive to light. Right eye exhibits no discharge. Left eye exhibits no discharge. No scleral icterus.  Neck: Trachea normal. No JVD present. No spinous process tenderness and no muscular tenderness present. Carotid bruit is not present. No tracheal deviation present. No thyromegaly present.  Cardiovascular: Normal rate, regular rhythm, normal heart sounds, intact distal pulses and normal pulses.  Exam reveals no gallop and no friction rub.   No murmur heard. Occasional PVC noted on monitor  Respiratory: Effort normal and breath sounds normal. No stridor. No respiratory distress. He has no wheezes. He has no rales. He exhibits no tenderness, no bony tenderness, no laceration and no crepitus.  GI: Soft. Normal appearance and bowel sounds are normal. He exhibits no distension. There is no tenderness. There is no rigidity, no rebound, no guarding and no CVA tenderness.  Genitourinary: Rectum normal and penis normal.  Musculoskeletal: Normal range of motion. He exhibits no edema and no tenderness.       Thoracic back: He exhibits bony tenderness.       Lumbar back: He exhibits bony tenderness.  Lymphadenopathy:    He has no cervical adenopathy.  Neurological: He is alert and oriented to person, place, and time. He has normal strength. No cranial nerve deficit or sensory deficit. GCS eye subscore is 4. GCS verbal subscore is 5. GCS motor subscore is 6.  Skin: Skin is warm and dry. Abrasion noted. He is not diaphoretic.  Psychiatric: He has a normal mood and affect. His speech is normal and behavior is normal.     Assessment/Plan Fall T11 fx -- NS to consult Left rib fxs -- Pulmonary  toilet Right sacral fx? -- Await official read Likely syncope -- will have cards consult HTN -- Hold antihypertensives for now    Lisette Abu, PA-C Pager: [phone] General Trauma PA Pager: [phone] 11/30/2013, 11:51 AM

## 2013-11-30 NOTE — ED Notes (Signed)
Family updated as to patient's status.

## 2013-11-30 NOTE — H&P (Signed)
Patient has a T-11 burst fracture without neurological deficit.  Apparently had some tingling when tranferred to the bed in ICU.  MRI scheduled..  This patient has been seen and I agree with the findings and treatment plan.  Kathryne Eriksson. Dahlia Bailiff, MD, [place] [phone] (pager) [phone] (direct pager) Trauma Surgeon

## 2013-11-30 NOTE — Progress Notes (Signed)
Responded to level 1 trauma 1 page  to provide emotional support to patient.  Patient passed out and  fell from latter.  Patient's wife was called and is in route to hospital.  Patient is alert and gone to CT for scan. Will follow as needed.  11/30/13 1100  Clinical Encounter Type  Visited With Patient;Health care provider  Visit Type Spiritual support;ED;Trauma  Referral From Nurse  Spiritual Encounters  Spiritual Needs Emotional  Stress Factors  Patient Stress Factors None identified  .Marland KitchenMalcolm, [place], [place]

## 2013-11-30 NOTE — ED Provider Notes (Signed)
CSN: [id number]     Arrival date & time 11/30/13  1051 History   First MD Initiated Contact with Patient 11/30/13 1110     Chief Complaint  Patient presents with  . Trauma     (Consider location/radiation/quality/duration/timing/severity/associated sxs/prior Treatment) Patient is a 68 y.o. male presenting with trauma.  Trauma Mechanism of injury: fall Injury location: torso Injury location detail: back and L flank Arrived directly from scene: yes   Fall:      Fall occurred: from a ladder      Height of fall: 20      Point of impact: unknown      Entrapped after fall: no  Protective equipment:       None      Suspicion of alcohol use: no      Suspicion of drug use: no  EMS/PTA data:      Bystander interventions: bystander C-spine precautions      Ambulatory at scene: no      Blood loss: none      Responsiveness: alert      Loss of consciousness: yes      Amnesic to event: yes  Current symptoms:      Associated symptoms:            Reports loss of consciousness.            Denies abdominal pain, back pain, chest pain, nausea, neck pain and vomiting.    Past Medical History  Diagnosis Date  . Hypertension   . High cholesterol    History reviewed. No pertinent past surgical history. Family History  Problem Relation Age of Onset  . Hypertension Paternal Grandfather    History  Substance Use Topics  . Smoking status: Former Research scientist (life sciences)  . Smokeless tobacco: Current User  . Alcohol Use: No    Review of Systems  Constitutional: Negative for diaphoresis.  HENT: Negative for dental problem, facial swelling and trouble swallowing.   Eyes: Negative for pain.  Respiratory: Negative for chest tightness.   Cardiovascular: Negative for chest pain.  Gastrointestinal: Negative for nausea, vomiting and abdominal pain.  Genitourinary: Negative for penile pain and testicular pain.  Musculoskeletal: Negative for back pain and neck pain.  Skin: Negative for wound.   Neurological: Positive for loss of consciousness. Negative for syncope, weakness, light-headedness and numbness.      Allergies  Review of patient's allergies indicates no known allergies.  Home Medications   Prior to Admission medications   Not on File   BP 101/61  Pulse 71  Temp(Src) 97.4 F (36.3 C) (Oral)  Resp 26  Ht 5\' 7"  (1.702 m)  Wt 156 lb 4.9 oz (70.9 kg)  BMI 24.48 kg/m2  SpO2 100% Physical Exam  Constitutional: He is oriented to person, place, and time. He appears well-developed and well-nourished. No distress.  HENT:  Head: Normocephalic and atraumatic.  Eyes: Pupils are equal, round, and reactive to light.  Neck: Normal range of motion.  Cardiovascular: Normal rate and regular rhythm.   Pulmonary/Chest: Effort normal and breath sounds normal. No respiratory distress. He exhibits no bony tenderness.  Abdominal: Soft. He exhibits no distension. There is tenderness.  Abrasion to LUQ  Musculoskeletal: Normal range of motion.       Cervical back: He exhibits no bony tenderness and no deformity.       Thoracic back: He exhibits tenderness and bony tenderness. He exhibits no deformity.       Lumbar back: He exhibits tenderness and  bony tenderness. He exhibits no deformity.  Neurological: He is alert and oriented to person, place, and time.  Skin: Skin is warm. He is not diaphoretic.    ED Course  Procedures (including critical care time) Labs Review Labs Reviewed  COMPREHENSIVE METABOLIC PANEL - Abnormal; Notable for the following:    Glucose, Bld 143 (*)    BUN 25 (*)    Creatinine, Ser 2.02 (*)    GFR calc non Af Amer 32 (*)    GFR calc Af Amer 38 (*)    Anion gap 17 (*)    All other components within normal limits  CBC - Abnormal; Notable for the following:    WBC 11.3 (*)    All other components within normal limits  I-STAT CG4 LACTIC ACID, ED - Abnormal; Notable for the following:    Lactic Acid, Venous 3.66 (*)    All other components within  normal limits  I-STAT CHEM 8, ED - Abnormal; Notable for the following:    BUN 27 (*)    Creatinine, Ser 2.40 (*)    Glucose, Bld 143 (*)    Calcium, Ion 1.07 (*)    All other components within normal limits  MRSA PCR SCREENING  ETHANOL  PROTIME-INR  CDS SEROLOGY  PREPARE FRESH FROZEN PLASMA  SAMPLE TO BLOOD BANK    Imaging Review Ct Head Wo Contrast  11/30/2013   CLINICAL DATA:  Syncope and subsequent fall from ladder  EXAM: CT HEAD WITHOUT CONTRAST  CT CERVICAL SPINE WITHOUT CONTRAST  TECHNIQUE: Multidetector CT imaging of the head and cervical spine was performed following the standard protocol without intravenous contrast. Multiplanar CT image reconstructions of the cervical spine were also generated.  COMPARISON:  None.  FINDINGS: CT HEAD FINDINGS  The ventricles are normal in size and position. There are basal ganglia calcifications bilaterally. There is faint cord plexus calcification in the temporal lobes. There is no intracranial hemorrhage nor acute ischemic event. There are mild white matter density changes consistent with chronic small vessel ischemia. The cerebellum and brainstem are normal.  There is no acute skull fracture. The observed paranasal sinuses and mastoid air cells are clear.  CT CERVICAL SPINE FINDINGS  The cervical vertebral bodies are preserved in height. There is disc space narrowing at C2-3, C4-5, and C6-7. There are degenerative changes of the atlanto-dens articulation. The prevertebral soft tissues are normal. There is no perched facet nor facet or spinous process fracture. There are degenerative changes at multiple facet joint levels bilaterally. The bony ring at each cervical level is intact. The pulmonary apices are clear. The observed portions of the first and second ribs are normal.  IMPRESSION: 1. There is no acute intracranial hemorrhage nor other acute intracranial abnormality. There are mild changes of chronic small vessel ischemia. 2. There is no acute  skull fracture. 3. There is no acute cervical spine fracture nor dislocation. There is multilevel degenerative disc change.   Electronically Signed   By: David  Martinique   On: 11/30/2013 12:25   Ct Chest W Contrast  11/30/2013   CLINICAL DATA:  Status post fall.  EXAM: CT CHEST, ABDOMEN, AND PELVIS WITH CONTRAST  TECHNIQUE: Multidetector CT imaging of the chest, abdomen and pelvis was performed following the standard protocol during bolus administration of intravenous contrast.  CONTRAST:  154mL OMNIPAQUE IOHEXOL 300 MG/ML SOLN131mL OMNIPAQUE IOHEXOL 300 MG/ML SOLN147mL OMNIPAQUE IOHEXOL 300 MG/ML SOLN  COMPARISON:  None.  FINDINGS: CT CHEST FINDINGS  No evidence of trauma to  the great vessels is identified. Heart size is normal. No pleural or pericardial effusion. Calcific aortic atherosclerosis is noted. A small hiatal hernia is seen. There is no pneumothorax. The patient has a T11 compression fracture with approximately 30% vertebral body height loss. There is also a transverse fracture through the lamina, facets and spinous process of T10 which is nondisplaced. Vertebral body alignment is maintained. Schmorl's nodes are noted.  CT ABDOMEN AND PELVIS FINDINGS  The gallbladder, liver, adrenal glands, spleen, pancreas and left kidney appear normal. Three nonobstructing right renal stones are identified measuring up to 0.7 cm. Right renal cyst is also noted. The small and large bowel and appendix appear normal. No focal bony abnormality is identified. Left L1 nondisplaced transverse process fracture is identified. Trace anterolisthesis of L5 on S1 due to facet arthropathy is noted.  IMPRESSION: Mild compression fracture of T11. There also appears to be a fracture through the posterior elements of T10. Please see report of dedicated thoracic spine CT scan this same day. Nondisplaced left L1 transverse process is also seen. No other evidence of trauma is seen.  Atherosclerosis.  Nonobstructing right renal stones.   Small hiatal hernia.   Electronically Signed   By: Inge Rise M.D.   On: 11/30/2013 12:26   Ct Cervical Spine Wo Contrast  11/30/2013   CLINICAL DATA:  Syncope and subsequent fall from ladder  EXAM: CT HEAD WITHOUT CONTRAST  CT CERVICAL SPINE WITHOUT CONTRAST  TECHNIQUE: Multidetector CT imaging of the head and cervical spine was performed following the standard protocol without intravenous contrast. Multiplanar CT image reconstructions of the cervical spine were also generated.  COMPARISON:  None.  FINDINGS: CT HEAD FINDINGS  The ventricles are normal in size and position. There are basal ganglia calcifications bilaterally. There is faint cord plexus calcification in the temporal lobes. There is no intracranial hemorrhage nor acute ischemic event. There are mild white matter density changes consistent with chronic small vessel ischemia. The cerebellum and brainstem are normal.  There is no acute skull fracture. The observed paranasal sinuses and mastoid air cells are clear.  CT CERVICAL SPINE FINDINGS  The cervical vertebral bodies are preserved in height. There is disc space narrowing at C2-3, C4-5, and C6-7. There are degenerative changes of the atlanto-dens articulation. The prevertebral soft tissues are normal. There is no perched facet nor facet or spinous process fracture. There are degenerative changes at multiple facet joint levels bilaterally. The bony ring at each cervical level is intact. The pulmonary apices are clear. The observed portions of the first and second ribs are normal.  IMPRESSION: 1. There is no acute intracranial hemorrhage nor other acute intracranial abnormality. There are mild changes of chronic small vessel ischemia. 2. There is no acute skull fracture. 3. There is no acute cervical spine fracture nor dislocation. There is multilevel degenerative disc change.   Electronically Signed   By: David  Martinique   On: 11/30/2013 12:25   Ct Thoracic Spine Wo Contrast  11/30/2013    CLINICAL DATA:  Status post fall.  Back pain.  EXAM: CT THORACIC SPINE WITHOUT CONTRAST  TECHNIQUE: Multidetector CT imaging of the thoracic spine was performed without intravenous contrast administration. Multiplanar CT image reconstructions were also generated.  COMPARISON:  None.  FINDINGS: The patient has a mild comminuted compression fracture deformity of T11. Vertebral body height loss is estimated at 30%. There is minimal bony retropulsion off the posterior margin of the vertebral body but the central canal appears open. Best visualized  on the sagittal images is a nondisplaced fracture extending through the inferior margin of the inferior facets of T10, laminae and spinous process. No other fracture is identified. Nondisplaced fracture of the left transverse process of L1 is also identified. Schmorl's nodes in the mid thoracic spine are noted. Vertebral body alignment is maintained.  IMPRESSION: Acute, mild compression fracture of T11 with minimal bony retropulsion. Nondisplaced fracture through the inferior T10 facet, lamina and spinous process is also identified.  Nondisplaced left L1 transverse process fracture.   Electronically Signed   By: Inge Rise M.D.   On: 11/30/2013 12:34   Ct Lumbar Spine Wo Contrast  11/30/2013   CLINICAL DATA:  Status post fall.  Back pain.  EXAM: CT LUMBAR SPINE WITHOUT CONTRAST  TECHNIQUE: Multidetector CT imaging of the lumbar spine was performed without intravenous contrast administration. Multiplanar CT image reconstructions were also generated.  COMPARISON:  None.  FINDINGS: Nondisplaced left L1 transverse process fracture is identified. No other fracture is seen with vertebral body height maintained. Trace anterolisthesis L5 on S1 due to facet degenerative disease is identified. There is convex right scoliosis. Imaged paraspinous structures demonstrate right renal stones and atherosclerotic vascular disease.  IMPRESSION: Nondisplaced left transverse process  fracture L1. No other acute abnormality.   Electronically Signed   By: Inge Rise M.D.   On: 11/30/2013 12:36   Ct Abdomen Pelvis W Contrast  11/30/2013   CLINICAL DATA:  Status post fall.  EXAM: CT CHEST, ABDOMEN, AND PELVIS WITH CONTRAST  TECHNIQUE: Multidetector CT imaging of the chest, abdomen and pelvis was performed following the standard protocol during bolus administration of intravenous contrast.  CONTRAST:  131mL OMNIPAQUE IOHEXOL 300 MG/ML SOLN134mL OMNIPAQUE IOHEXOL 300 MG/ML SOLN174mL OMNIPAQUE IOHEXOL 300 MG/ML SOLN  COMPARISON:  None.  FINDINGS: CT CHEST FINDINGS  No evidence of trauma to the great vessels is identified. Heart size is normal. No pleural or pericardial effusion. Calcific aortic atherosclerosis is noted. A small hiatal hernia is seen. There is no pneumothorax. The patient has a T11 compression fracture with approximately 30% vertebral body height loss. There is also a transverse fracture through the lamina, facets and spinous process of T10 which is nondisplaced. Vertebral body alignment is maintained. Schmorl's nodes are noted.  CT ABDOMEN AND PELVIS FINDINGS  The gallbladder, liver, adrenal glands, spleen, pancreas and left kidney appear normal. Three nonobstructing right renal stones are identified measuring up to 0.7 cm. Right renal cyst is also noted. The small and large bowel and appendix appear normal. No focal bony abnormality is identified. Left L1 nondisplaced transverse process fracture is identified. Trace anterolisthesis of L5 on S1 due to facet arthropathy is noted.  IMPRESSION: Mild compression fracture of T11. There also appears to be a fracture through the posterior elements of T10. Please see report of dedicated thoracic spine CT scan this same day. Nondisplaced left L1 transverse process is also seen. No other evidence of trauma is seen.  Atherosclerosis.  Nonobstructing right renal stones.  Small hiatal hernia.   Electronically Signed   By: Inge Rise  M.D.   On: 11/30/2013 12:26   Dg Pelvis Portable  11/30/2013   CLINICAL DATA:  Status post fall from a ladder.  EXAM: PORTABLE PELVIS 1-2 VIEWS  COMPARISON:  None.  FINDINGS: No acute bony or joint abnormality is identified. Joint spaces are preserved. Soft tissue structures are unremarkable.  IMPRESSION: No acute finding.   Electronically Signed   By: Inge Rise M.D.   On: 11/30/2013  11:38   Dg Chest Port 1 View  11/30/2013   CLINICAL DATA:  Status post fall complaining of chest and rib pain  EXAM: PORTABLE CHEST - 1 VIEW  COMPARISON:  None.  FINDINGS: The lungs are adequately inflated. There is no pneumothorax or pleural effusion. The heart and mediastinal structures are within the limits of normal. There is a minimally displaced fracture of the anterior lateral aspect of the left eighth rib. An adjacent ninth rib fracture may be present.  IMPRESSION: There is no acute parenchymal abnormality of the lungs or mediastinal structures. There is fracture of the left eighth and possibly adjacent ninth ribs. A rib detail series is recommended.   Electronically Signed   By: David  Martinique   On: 11/30/2013 11:37     EKG Interpretation None      MDM   Final diagnoses:  Orthostatic hypotension  Syncope, unspecified syncope type   Alexander Cox is a 68 y.o. male who presents to the ED with trauma 2/2 fall from ladder 20 feet ladder.   Level 1 Trauma Code called prior to arrival. Upon arrival, patient with physical exam as above. Airway intact. Breathing: spontaneous. Circulation: 2 IVs established, manual BP as above. CXR performed. Secondary performed, PE significant for the following: back pain, L flank tenderness.  Patient  stabilized prior to transfer to CT scanning. CT results as above, significant for spinal fractures (T10, T11, L1).   Anticipate admission to the trauma. Patient seen and evaluated by myself and my attending, Dr. Alvino Chapel.       Freddi Che, MD 11/30/13 [phone]

## 2013-11-30 NOTE — ED Notes (Signed)
See trauma narrator 

## 2013-11-30 NOTE — Procedures (Signed)
F.A.S.T.  Four quadrant ultrasound performed for detection of fluid in the abdomen.  No fluid noted     LUQ    RUQ   Pelvic

## 2013-11-30 NOTE — Consult Note (Signed)
 Reason for Consult: Hypotension Referring Physician: Trauma MD Primary Care Provider: Dr. Kimberly Shaw Primary cardiologist: New   HPI: The patient is a 68-year-old male, with no prior cardiac history, admitted today to  Hospital, by the Trauma Service, after sustaining a syncopal episode that resulted in a fall off of a 20 foot ladder. He is followed medically by Dr. Kimberly Shaw. His past medical history is significant for hypertension and hyperlipidemia. He states that he was recently seen by Dr. Shaw and was noted to be hypertensive. His medications were adjusted. He had been on lisinopril, however he was placed on a diuretic for additional blood pressure control. Since starting the medication, and he has noticed frequent episodes of dizziness and near syncope. He denies associated chest pain, dyspnea and palpitations. He has monitored his blood pressure closely at home and has noticed frequent hypotension. He had intentions of contacting Dr. Shaw for recommendations however he did not get around to following up with her.   Today, while up on the ladder, he started to feel dizzy and had to step down from the ladder several times. He decided to go back up on the ladder to continue working and at that point had recurrent dizziness, loss of consciousness and fell off the ladder. When EMS arrived, he was noted to be hypotensive with systolic pressures in the 80s. Imaging studies  revealed a T11 vertebral fracture. He originally was a level II trauma but due to hypotension he was upgraded to level 1. He states that he did take his blood pressure medications this morning. He denies use of any other medications. No recent history of high volume losses including no diarrhea, vomiting, melena or hematochezia.  His main complaint currently is low back/ bilateral leg pain. He currently denies any other symptoms. He is still hypotensive with systolic pressures in the upper 80s. Unfortunately, the  use of pain medications is limited by his hypertension.     Past Medical History  Diagnosis Date  . Hypertension   . High cholesterol     History reviewed. No pertinent past surgical history.  Family History  Problem Relation Age of Onset  . Hypertension Paternal Grandfather     Social History:  reports that he has quit smoking. He uses smokeless tobacco. He reports that he does not drink alcohol or use illicit drugs.  Allergies: No Known Allergies  Medications: Prior to Admission medications   Not on File     Results for orders placed during the hospital encounter of 11/30/13 (from the past 48 hour(s))  PREPARE FRESH FROZEN PLASMA     Status: None   Collection Time    11/30/13 10:58 AM      Result Value Ref Range   Unit Number [id number]     Blood Component Type LIQ PLASMA     Unit division 00     Status of Unit REL FROM ALLOC     Unit tag comment VERBAL ORDERS PER DR PICKERING     Transfusion Status OK TO TRANSFUSE     Unit Number [id number]     Blood Component Type LIQ PLASMA     Unit division 00     Status of Unit QUARANTINED     Unit tag comment VERBAL ORDERS PER DR PICKERING     Transfusion Status OK TO TRANSFUSE     Unit Number [id number]     Blood Component Type THAWED PLASMA     Unit division 00       Status of Unit REL FROM ALLOC     Unit tag comment VERBAL ORDERS PER DR PICKERING     Transfusion Status OK TO TRANSFUSE    COMPREHENSIVE METABOLIC PANEL     Status: Abnormal   Collection Time    11/30/13 11:15 AM      Result Value Ref Range   Sodium 141  137 - 147 mEq/L   Potassium 4.0  3.7 - 5.3 mEq/L   Chloride 102  96 - 112 mEq/L   CO2 22  19 - 32 mEq/L   Glucose, Bld 143 (*) 70 - 99 mg/dL   BUN 25 (*) 6 - 23 mg/dL   Creatinine, Ser 2.02 (*) 0.50 - 1.35 mg/dL   Calcium 8.8  8.4 - 10.5 mg/dL   Total Protein 6.6  6.0 - 8.3 g/dL   Albumin 3.8  3.5 - 5.2 g/dL   AST 32  0 - 37 U/L   ALT 37  0 - 53 U/L   Alkaline Phosphatase 65  39 - 117  U/L   Total Bilirubin 0.4  0.3 - 1.2 mg/dL   GFR calc non Af Amer 32 (*) >90 mL/min   GFR calc Af Amer 38 (*) >90 mL/min   Comment: (NOTE)     The eGFR has been calculated using the CKD EPI equation.     This calculation has not been validated in all clinical situations.     eGFR's persistently <90 mL/min signify possible Chronic Kidney     Disease.   Anion gap 17 (*) 5 - 15  CBC     Status: Abnormal   Collection Time    11/30/13 11:15 AM      Result Value Ref Range   WBC 11.3 (*) 4.0 - 10.5 K/uL   RBC 4.54  4.22 - 5.81 MIL/uL   Hemoglobin 13.9  13.0 - 17.0 g/dL   HCT 41.9  39.0 - 52.0 %   MCV 92.3  78.0 - 100.0 fL   MCH 30.6  26.0 - 34.0 pg   MCHC 33.2  30.0 - 36.0 g/dL   RDW 12.0  11.5 - 15.5 %   Platelets 263  150 - 400 K/uL  ETHANOL     Status: None   Collection Time    11/30/13 11:15 AM      Result Value Ref Range   Alcohol, Ethyl (B) <11  0 - 11 mg/dL   Comment:            LOWEST DETECTABLE LIMIT FOR     SERUM ALCOHOL IS 11 mg/dL     FOR MEDICAL PURPOSES ONLY  PROTIME-INR     Status: None   Collection Time    11/30/13 11:15 AM      Result Value Ref Range   Prothrombin Time 13.6  11.6 - 15.2 seconds   INR 1.04  0.00 - 1.49  CDS SEROLOGY     Status: None   Collection Time    11/30/13 11:15 AM      Result Value Ref Range   CDS serology specimen       Value: SPECIMEN WILL BE HELD FOR 14 DAYS IF TESTING IS REQUIRED  I-STAT CG4 LACTIC ACID, ED     Status: Abnormal   Collection Time    11/30/13 11:20 AM      Result Value Ref Range   Lactic Acid, Venous 3.66 (*) 0.5 - 2.2 mmol/L  I-STAT CHEM 8, ED     Status: Abnormal     Collection Time    11/30/13 11:20 AM      Result Value Ref Range   Sodium 141  137 - 147 mEq/L   Potassium 3.8  3.7 - 5.3 mEq/L   Chloride 104  96 - 112 mEq/L   BUN 27 (*) 6 - 23 mg/dL   Creatinine, Ser 2.40 (*) 0.50 - 1.35 mg/dL   Glucose, Bld 143 (*) 70 - 99 mg/dL   Calcium, Ion 1.07 (*) 1.13 - 1.30 mmol/L   TCO2 21  0 - 100 mmol/L    Hemoglobin 14.3  13.0 - 17.0 g/dL   HCT 42.0  39.0 - 52.0 %  SAMPLE TO BLOOD BANK     Status: None   Collection Time    11/30/13 11:24 AM      Result Value Ref Range   Blood Bank Specimen SAMPLE AVAILABLE FOR TESTING     Sample Expiration 12/01/2013      Ct Head Wo Contrast  11/30/2013   CLINICAL DATA:  Syncope and subsequent fall from ladder  EXAM: CT HEAD WITHOUT CONTRAST  CT CERVICAL SPINE WITHOUT CONTRAST  TECHNIQUE: Multidetector CT imaging of the head and cervical spine was performed following the standard protocol without intravenous contrast. Multiplanar CT image reconstructions of the cervical spine were also generated.  COMPARISON:  None.  FINDINGS: CT HEAD FINDINGS  The ventricles are normal in size and position. There are basal ganglia calcifications bilaterally. There is faint cord plexus calcification in the temporal lobes. There is no intracranial hemorrhage nor acute ischemic event. There are mild white matter density changes consistent with chronic small vessel ischemia. The cerebellum and brainstem are normal.  There is no acute skull fracture. The observed paranasal sinuses and mastoid air cells are clear.  CT CERVICAL SPINE FINDINGS  The cervical vertebral bodies are preserved in height. There is disc space narrowing at C2-3, C4-5, and C6-7. There are degenerative changes of the atlanto-dens articulation. The prevertebral soft tissues are normal. There is no perched facet nor facet or spinous process fracture. There are degenerative changes at multiple facet joint levels bilaterally. The bony ring at each cervical level is intact. The pulmonary apices are clear. The observed portions of the first and second ribs are normal.  IMPRESSION: 1. There is no acute intracranial hemorrhage nor other acute intracranial abnormality. There are mild changes of chronic small vessel ischemia. 2. There is no acute skull fracture. 3. There is no acute cervical spine fracture nor dislocation. There is  multilevel degenerative disc change.   Electronically Signed   By: David  Jordan   On: 11/30/2013 12:25   Ct Chest W Contrast  11/30/2013   CLINICAL DATA:  Status post fall.  EXAM: CT CHEST, ABDOMEN, AND PELVIS WITH CONTRAST  TECHNIQUE: Multidetector CT imaging of the chest, abdomen and pelvis was performed following the standard protocol during bolus administration of intravenous contrast.  CONTRAST:  100mL OMNIPAQUE IOHEXOL 300 MG/ML SOLN100mL OMNIPAQUE IOHEXOL 300 MG/ML SOLN100mL OMNIPAQUE IOHEXOL 300 MG/ML SOLN  COMPARISON:  None.  FINDINGS: CT CHEST FINDINGS  No evidence of trauma to the great vessels is identified. Heart size is normal. No pleural or pericardial effusion. Calcific aortic atherosclerosis is noted. A small hiatal hernia is seen. There is no pneumothorax. The patient has a T11 compression fracture with approximately 30% vertebral body height loss. There is also a transverse fracture through the lamina, facets and spinous process of T10 which is nondisplaced. Vertebral body alignment is maintained. Schmorl's nodes are noted.    CT ABDOMEN AND PELVIS FINDINGS  The gallbladder, liver, adrenal glands, spleen, pancreas and left kidney appear normal. Three nonobstructing right renal stones are identified measuring up to 0.7 cm. Right renal cyst is also noted. The small and large bowel and appendix appear normal. No focal bony abnormality is identified. Left L1 nondisplaced transverse process fracture is identified. Trace anterolisthesis of L5 on S1 due to facet arthropathy is noted.  IMPRESSION: Mild compression fracture of T11. There also appears to be a fracture through the posterior elements of T10. Please see report of dedicated thoracic spine CT scan this same day. Nondisplaced left L1 transverse process is also seen. No other evidence of trauma is seen.  Atherosclerosis.  Nonobstructing right renal stones.  Small hiatal hernia.   Electronically Signed   By: Inge Rise M.D.   On: 11/30/2013  12:26   Ct Cervical Spine Wo Contrast  11/30/2013   CLINICAL DATA:  Syncope and subsequent fall from ladder  EXAM: CT HEAD WITHOUT CONTRAST  CT CERVICAL SPINE WITHOUT CONTRAST  TECHNIQUE: Multidetector CT imaging of the head and cervical spine was performed following the standard protocol without intravenous contrast. Multiplanar CT image reconstructions of the cervical spine were also generated.  COMPARISON:  None.  FINDINGS: CT HEAD FINDINGS  The ventricles are normal in size and position. There are basal ganglia calcifications bilaterally. There is faint cord plexus calcification in the temporal lobes. There is no intracranial hemorrhage nor acute ischemic event. There are mild white matter density changes consistent with chronic small vessel ischemia. The cerebellum and brainstem are normal.  There is no acute skull fracture. The observed paranasal sinuses and mastoid air cells are clear.  CT CERVICAL SPINE FINDINGS  The cervical vertebral bodies are preserved in height. There is disc space narrowing at C2-3, C4-5, and C6-7. There are degenerative changes of the atlanto-dens articulation. The prevertebral soft tissues are normal. There is no perched facet nor facet or spinous process fracture. There are degenerative changes at multiple facet joint levels bilaterally. The bony ring at each cervical level is intact. The pulmonary apices are clear. The observed portions of the first and second ribs are normal.  IMPRESSION: 1. There is no acute intracranial hemorrhage nor other acute intracranial abnormality. There are mild changes of chronic small vessel ischemia. 2. There is no acute skull fracture. 3. There is no acute cervical spine fracture nor dislocation. There is multilevel degenerative disc change.   Electronically Signed   By: David  Martinique   On: 11/30/2013 12:25   Ct Thoracic Spine Wo Contrast  11/30/2013   CLINICAL DATA:  Status post fall.  Back pain.  EXAM: CT THORACIC SPINE WITHOUT CONTRAST   TECHNIQUE: Multidetector CT imaging of the thoracic spine was performed without intravenous contrast administration. Multiplanar CT image reconstructions were also generated.  COMPARISON:  None.  FINDINGS: The patient has a mild comminuted compression fracture deformity of T11. Vertebral body height loss is estimated at 30%. There is minimal bony retropulsion off the posterior margin of the vertebral body but the central canal appears open. Best visualized on the sagittal images is a nondisplaced fracture extending through the inferior margin of the inferior facets of T10, laminae and spinous process. No other fracture is identified. Nondisplaced fracture of the left transverse process of L1 is also identified. Schmorl's nodes in the mid thoracic spine are noted. Vertebral body alignment is maintained.  IMPRESSION: Acute, mild compression fracture of T11 with minimal bony retropulsion. Nondisplaced fracture through the inferior  T10 facet, lamina and spinous process is also identified.  Nondisplaced left L1 transverse process fracture.   Electronically Signed   By: Thomas  Dalessio M.D.   On: 11/30/2013 12:34   Ct Lumbar Spine Wo Contrast  11/30/2013   CLINICAL DATA:  Status post fall.  Back pain.  EXAM: CT LUMBAR SPINE WITHOUT CONTRAST  TECHNIQUE: Multidetector CT imaging of the lumbar spine was performed without intravenous contrast administration. Multiplanar CT image reconstructions were also generated.  COMPARISON:  None.  FINDINGS: Nondisplaced left L1 transverse process fracture is identified. No other fracture is seen with vertebral body height maintained. Trace anterolisthesis L5 on S1 due to facet degenerative disease is identified. There is convex right scoliosis. Imaged paraspinous structures demonstrate right renal stones and atherosclerotic vascular disease.  IMPRESSION: Nondisplaced left transverse process fracture L1. No other acute abnormality.   Electronically Signed   By: Thomas  Dalessio M.D.    On: 11/30/2013 12:36   Ct Abdomen Pelvis W Contrast  11/30/2013   CLINICAL DATA:  Status post fall.  EXAM: CT CHEST, ABDOMEN, AND PELVIS WITH CONTRAST  TECHNIQUE: Multidetector CT imaging of the chest, abdomen and pelvis was performed following the standard protocol during bolus administration of intravenous contrast.  CONTRAST:  100mL OMNIPAQUE IOHEXOL 300 MG/ML SOLN100mL OMNIPAQUE IOHEXOL 300 MG/ML SOLN100mL OMNIPAQUE IOHEXOL 300 MG/ML SOLN  COMPARISON:  None.  FINDINGS: CT CHEST FINDINGS  No evidence of trauma to the great vessels is identified. Heart size is normal. No pleural or pericardial effusion. Calcific aortic atherosclerosis is noted. A small hiatal hernia is seen. There is no pneumothorax. The patient has a T11 compression fracture with approximately 30% vertebral body height loss. There is also a transverse fracture through the lamina, facets and spinous process of T10 which is nondisplaced. Vertebral body alignment is maintained. Schmorl's nodes are noted.  CT ABDOMEN AND PELVIS FINDINGS  The gallbladder, liver, adrenal glands, spleen, pancreas and left kidney appear normal. Three nonobstructing right renal stones are identified measuring up to 0.7 cm. Right renal cyst is also noted. The small and large bowel and appendix appear normal. No focal bony abnormality is identified. Left L1 nondisplaced transverse process fracture is identified. Trace anterolisthesis of L5 on S1 due to facet arthropathy is noted.  IMPRESSION: Mild compression fracture of T11. There also appears to be a fracture through the posterior elements of T10. Please see report of dedicated thoracic spine CT scan this same day. Nondisplaced left L1 transverse process is also seen. No other evidence of trauma is seen.  Atherosclerosis.  Nonobstructing right renal stones.  Small hiatal hernia.   Electronically Signed   By: Thomas  Dalessio M.D.   On: 11/30/2013 12:26   Dg Pelvis Portable  11/30/2013   CLINICAL DATA:  Status post  fall from a ladder.  EXAM: PORTABLE PELVIS 1-2 VIEWS  COMPARISON:  None.  FINDINGS: No acute bony or joint abnormality is identified. Joint spaces are preserved. Soft tissue structures are unremarkable.  IMPRESSION: No acute finding.   Electronically Signed   By: Thomas  Dalessio M.D.   On: 11/30/2013 11:38   Dg Chest Port 1 View  11/30/2013   CLINICAL DATA:  Status post fall complaining of chest and rib pain  EXAM: PORTABLE CHEST - 1 VIEW  COMPARISON:  None.  FINDINGS: The lungs are adequately inflated. There is no pneumothorax or pleural effusion. The heart and mediastinal structures are within the limits of normal. There is a minimally displaced fracture of the anterior lateral aspect   of the left eighth rib. An adjacent ninth rib fracture may be present.  IMPRESSION: There is no acute parenchymal abnormality of the lungs or mediastinal structures. There is fracture of the left eighth and possibly adjacent ninth ribs. A rib detail series is recommended.   Electronically Signed   By: David  Jordan   On: 11/30/2013 11:37    Review of Systems  Respiratory: Negative for shortness of breath.   Cardiovascular: Negative for chest pain and palpitations.  Gastrointestinal: Negative for nausea, vomiting, diarrhea, blood in stool and melena.  Musculoskeletal: Positive for back pain (low).  Neurological: Positive for dizziness and loss of consciousness.   Blood pressure 87/51, pulse 72, temperature 97.4 F (36.3 C), temperature source Oral, resp. rate 17, height 5' 7" (1.702 m), weight 156 lb 4.9 oz (70.9 kg), SpO2 100.00%. Physical Exam  Constitutional: He is oriented to person, place, and time. He appears well-developed and well-nourished. No distress.  Neck: JVD: difficult to assess due to neck brace.  Cardiovascular: Normal rate and regular rhythm.  Exam reveals no gallop and no friction rub.   No murmur heard. Respiratory: Effort normal and breath sounds normal. No respiratory distress. He has no  wheezes. He has no rales.  Musculoskeletal: He exhibits no edema.  Neurological: He is alert and oriented to person, place, and time.  Skin: Skin is warm and dry. Rash: .hoprob. He is not diaphoretic.  Psychiatric: He has a normal mood and affect. His behavior is normal.    Assessment/Plan: Active Problems:   T11 vertebral fracture   Hypotension   Syncope  1. Syncope/Hypotension: Pt also appears dehydrated with Scr oc 2.40 and BUN of 27. This  was likely a result of medication induced  Hypotension/ dehydration. Hold antihypertensives. Check orthostatics once pt is able to better change positions. Recommend hydration with IVFs.  Will assess EF via echo.  Will also use echo to rule out other possible cardiac etiologies, including pericardial effusion and valvular abnormalities.  He denies history of palpitations, however continue on telemetry to rule out arrhythmias.   2. AKI: Likely secondary to recent diuretic use, dehydration and hypotension. Hydrate with IVFs. Repeat BMP in the a.m. Continue to hold diuretic and ACE.   3. T11 vertebral fracture: management per Trauma  SIMMONS, BRITTAINY 11/30/2013, 2:17 PM   Attending Note:   The patient was seen and examined.  Agree with assessment and plan as noted above.  Changes made to the above note as needed.  Mr. Dineen was recently started on Lisinopril and HCTZ and had been having episodes of presyncope .  He was out working on a tall ladder today ( very hot outside) and passed out resulting in a fall.   In the ER he was thought to be volume depleted and was receiving IV fluids. His BP has improved with IVF.  Exam is as above. Tele:  NSR, normal conduction, normal HR.  Imp:  At this point, I think his symptoms are related to hypotension due to his recently prescribed BP pills.   He eats some salt according to his family and perhaps did not eat and drink as much today resulting in the hypotension and syncope.  Would monitor him for  arrhythmias but so far has has not had anything worrisome. Hold BP meds. Will get echo to look at LV function  Will see tomorrow to evaluate the echo results and tele. I do not think he necessarily needs to follow up with cardiology if these things look   OK.  He does need to eliminate the salt in his diet.      Thayer Headings, Brooke Bonito., MD, [facility] 11/30/2013, 2:45 PM

## 2013-11-30 NOTE — Clinical Social Work Note (Signed)
Clinical Social Worker responded to Level 1 page for fall from 20 foot ladder.  Per EMS, patient had stated that he was having blood pressure issues and is not sure if it was a result or cause of the fall.  Patient being transported to CT.  Patient wife has been notified of patient hospitalization per PA.  CSW to follow up with patient and patient wife upon admission.  CSW available for support as needed.  Barbette Or, [place]

## 2013-12-01 ENCOUNTER — Inpatient Hospital Stay (HOSPITAL_COMMUNITY): Payer: Medicare Other

## 2013-12-01 DIAGNOSIS — E860 Dehydration: Secondary | ICD-10-CM

## 2013-12-01 DIAGNOSIS — E785 Hyperlipidemia, unspecified: Secondary | ICD-10-CM

## 2013-12-01 DIAGNOSIS — S2242XA Multiple fractures of ribs, left side, initial encounter for closed fracture: Secondary | ICD-10-CM

## 2013-12-01 DIAGNOSIS — N289 Disorder of kidney and ureter, unspecified: Secondary | ICD-10-CM

## 2013-12-01 DIAGNOSIS — N1831 Chronic kidney disease, stage 3a: Secondary | ICD-10-CM | POA: Diagnosis present

## 2013-12-01 DIAGNOSIS — I10 Essential (primary) hypertension: Secondary | ICD-10-CM

## 2013-12-01 HISTORY — DX: Essential (primary) hypertension: I10

## 2013-12-01 HISTORY — DX: Disorder of kidney and ureter, unspecified: N28.9

## 2013-12-01 HISTORY — DX: Hyperlipidemia, unspecified: E78.5

## 2013-12-01 HISTORY — DX: Dehydration: E86.0

## 2013-12-01 HISTORY — DX: Multiple fractures of ribs, left side, initial encounter for closed fracture: S22.42XA

## 2013-12-01 LAB — CBC
HEMATOCRIT: 36.7 % — AB (ref 39.0–52.0)
HEMOGLOBIN: 12.4 g/dL — AB (ref 13.0–17.0)
MCH: 31.4 pg (ref 26.0–34.0)
MCHC: 33.8 g/dL (ref 30.0–36.0)
MCV: 92.9 fL (ref 78.0–100.0)
Platelets: 199 10*3/uL (ref 150–400)
RBC: 3.95 MIL/uL — AB (ref 4.22–5.81)
RDW: 12.4 % (ref 11.5–15.5)
WBC: 9.7 10*3/uL (ref 4.0–10.5)

## 2013-12-01 LAB — BASIC METABOLIC PANEL
Anion gap: 14 (ref 5–15)
BUN: 31 mg/dL — AB (ref 6–23)
CO2: 24 mEq/L (ref 19–32)
CREATININE: 1.78 mg/dL — AB (ref 0.50–1.35)
Calcium: 8.2 mg/dL — ABNORMAL LOW (ref 8.4–10.5)
Chloride: 100 mEq/L (ref 96–112)
GFR calc Af Amer: 44 mL/min — ABNORMAL LOW (ref >90)
GFR calc non Af Amer: 38 mL/min — ABNORMAL LOW (ref >90)
Glucose, Bld: 104 mg/dL — ABNORMAL HIGH (ref 70–99)
Potassium: 4.4 mEq/L (ref 3.7–5.3)
Sodium: 138 mEq/L (ref 137–147)

## 2013-12-01 LAB — PREPARE FRESH FROZEN PLASMA
UNIT DIVISION: 0
Unit division: 0
Unit division: 0

## 2013-12-01 MED ORDER — ENOXAPARIN SODIUM 40 MG/0.4ML ~~LOC~~ SOLN
40.0000 mg | SUBCUTANEOUS | Status: DC
  Administered 2013-12-01 – 2013-12-04 (×4): 40 mg via SUBCUTANEOUS
  Filled 2013-12-01 (×4): qty 0.4

## 2013-12-01 MED ORDER — VITAMIN D3 25 MCG (1000 UNIT) PO TABS
1000.0000 [IU] | ORAL_TABLET | Freq: Every day | ORAL | Status: DC
  Administered 2013-12-01 – 2013-12-04 (×4): 1000 [IU] via ORAL
  Filled 2013-12-01 (×4): qty 1

## 2013-12-01 MED ORDER — SODIUM CHLORIDE 0.9 % IV SOLN
INTRAVENOUS | Status: DC | PRN
  Administered 2013-11-30: 1000 mL via INTRAVENOUS

## 2013-12-01 MED ORDER — CALCIUM CARBONATE 600 MG PO TABS
600.0000 mg | ORAL_TABLET | Freq: Every day | ORAL | Status: DC
  Filled 2013-12-01 (×2): qty 1

## 2013-12-01 MED ORDER — CALCIUM CARBONATE 1250 (500 CA) MG PO TABS
1.0000 | ORAL_TABLET | Freq: Every day | ORAL | Status: DC
  Administered 2013-12-01 – 2013-12-04 (×4): 500 mg via ORAL
  Filled 2013-12-01 (×5): qty 1

## 2013-12-01 MED ORDER — SIMVASTATIN 20 MG PO TABS
20.0000 mg | ORAL_TABLET | Freq: Every day | ORAL | Status: DC
  Administered 2013-12-01 – 2013-12-03 (×3): 20 mg via ORAL
  Filled 2013-12-01 (×4): qty 1

## 2013-12-01 MED ORDER — ASPIRIN EC 81 MG PO TBEC
81.0000 mg | DELAYED_RELEASE_TABLET | Freq: Every day | ORAL | Status: DC
  Administered 2013-12-01 – 2013-12-04 (×4): 81 mg via ORAL
  Filled 2013-12-01 (×4): qty 1

## 2013-12-01 NOTE — Progress Notes (Signed)
Patient ID: Alexander Cox, male   DOB: 1945-08-03, 68 y.o.   MRN: [id number]   LOS: 1 day   Subjective: No new c/o. BLE paresthesias improved but still present. Denies N/V.   Objective: Vital signs in last 24 hours: Temp:  [96.4 F (35.8 C)-100.4 F (38 C)] 98.5 F (36.9 C) (07/21 0745) Pulse Rate:  [68-86] 71 (07/21 0800) Resp:  [12-26] 15 (07/21 0800) BP: (72-115)/(44-75) 96/53 mmHg (07/21 0800) SpO2:  [92 %-100 %] 100 % (07/21 0800) Weight:  [155 lb (70.308 kg)-156 lb 4.9 oz (70.9 kg)] 156 lb 4.9 oz (70.9 kg) (07/20 1223)    Laboratory  CBC  Recent Labs  11/30/13 1115 11/30/13 1120 12/01/13 0305  WBC 11.3*  --  9.7  HGB 13.9 14.3 12.4*  HCT 41.9 42.0 36.7*  PLT 263  --  199   BMET  Recent Labs  11/30/13 1115 11/30/13 1120 12/01/13 0305  NA 141 141 138  K 4.0 3.8 4.4  CL 102 104 100  CO2 22  --  24  GLUCOSE 143* 143* 104*  BUN 25* 27* 31*  CREATININE 2.02* 2.40* 1.78*  CALCIUM 8.8  --  8.2*    Physical Exam General appearance: alert and no distress Resp: clear to auscultation bilaterally Cardio: regular rate and rhythm GI: normal findings: bowel sounds normal and soft, non-tender   Assessment/Plan: Fall Multiple left rib fxs -- Pulmonary toilet T10-11 fxs -- MRI pending, plan TLSO  Syncope -- Likely dehydration 2/2 BP meds, appreciate cardiology consult ABL anemia -- Mild, follow AKI -- Likely from dehydration, improving, will follow HTN/Dyslipidemia FEN -- No issues VTE -- SCD's, Lovenox Dispo -- Awaiting MRI    Lisette Abu, PA-C Pager: [phone] General Trauma PA Pager: [phone]  12/01/2013

## 2013-12-01 NOTE — Progress Notes (Signed)
Overall stable. States that lower extremity numbness improved from yesterday. Still with no complaints of weakness. A pain level overall better than yesterday. MRI scan not performed last night.  Vitals are stable. He is afebrile. Motor examination intact bilaterally. Sensory examination with some decreased sensation from L1 distally bilaterally. Complicating this is some degree of chronic left lower trimming numbness relating to previous lumbar disc surgery.  Overall stable. Awaiting MRI scans. Mobilize in TLSO if MRI scans are okay

## 2013-12-01 NOTE — Progress Notes (Signed)
    Subjective:  Awake and alert  Objective:  Vital Signs in the last 24 hours: Temp:  [97.8 F (36.6 C)-100.4 F (38 C)] 98.5 F (36.9 C) (07/21 0745) Pulse Rate:  [68-85] 68 (07/21 1200) Resp:  [13-26] 15 (07/21 1200) BP: (87-125)/(44-77) 118/56 mmHg (07/21 1200) SpO2:  [91 %-100 %] 97 % (07/21 1200)  Intake/Output from previous day:  Intake/Output Summary (Last 24 hours) at 12/01/13 1312 Last data filed at 12/01/13 1200  Gross per 24 hour  Intake   1895 ml  Output   1125 ml  Net    770 ml    Physical Exam: General appearance: alert, cooperative and no distress Lungs: clear to auscultation bilaterally Heart: regular rate and rhythm   Rate: 68  Rhythm: normal sinus rhythm  Lab Results:  Recent Labs  11/30/13 1115 11/30/13 1120 12/01/13 0305  WBC 11.3*  --  9.7  HGB 13.9 14.3 12.4*  PLT 263  --  199    Recent Labs  11/30/13 1115 11/30/13 1120 12/01/13 0305  NA 141 141 138  K 4.0 3.8 4.4  CL 102 104 100  CO2 22  --  24  GLUCOSE 143* 143* 104*  BUN 25* 27* 31*  CREATININE 2.02* 2.40* 1.78*   No results found for this basename: TROPONINI, CK, MB,  in the last 72 hours  Recent Labs  11/30/13 1115  INR 1.04    Imaging: Imaging results have been reviewed  Cardiac Studies:  Assessment/Plan:  68 year old male, with no prior cardiac history followed by Dr Brigitte Pulse with HTN. His medications were recently adjusted, a diuretic was added to an ACE. The pt was up on a ladder and apparently had a syncopal spell and fell. He suffered a T11 fx. He was dehydrated on admission with hypotension and a SCr of 2.40. He has been monitored- no arrythmia. Echo is pending. His SCr has improved- 1.78 with hydration.   Principal Problem:   Syncope Active Problems:   T11 vertebral fracture   Hypotension   Dehydration   HTN (hypertension)   Acute renal insufficiency   Multiple fractures of ribs of left side    PLAN: He is still getting IVF-1/2 NS 100cc hr. Echo  has not been done yet - will follow this up.   Kerin Ransom PA-C Beeper [phone] 12/01/2013, 1:12 PM  History and all data above reviewed.  Patient examined.  I agree with the findings as above. No cardiac complaints.  Tele without arrhythmias The patient exam reveals COR:RRR  ,  Lungs: Clear  ,  Abd: Positive bowel sounds, no rebound no guarding, Ext No edema  .  All available labs, radiology testing, previous records reviewed. Agree with documented assessment and plan. Sycnope:  Doubt further work up will be needed after the [employer].    Cheree Fowles  1:30 PM  12/01/2013

## 2013-12-01 NOTE — Progress Notes (Signed)
Transporter to floor to transport patient to MRI.

## 2013-12-01 NOTE — Clinical Social Work Note (Signed)
Clinical Social Work Department BRIEF PSYCHOSOCIAL ASSESSMENT 12/01/2013  Patient:  Alexander Cox, Alexander Cox     Account Number:  [id number]     Admit date:  11/30/2013  Clinical Social Worker:  Myles Lipps  Date/Time:  12/01/2013 02:15 PM  Referred by:  Physician  Date Referred:  12/01/2013 Referred for  Psychosocial assessment   Other Referral:   Interview type:  Patient Other interview type:   Patient wife at bedside    PSYCHOSOCIAL DATA Living Status:  WIFE Admitted from facility:   Level of care:   Primary support name:  [place],Pat  [phone] Primary support relationship to patient:  SPOUSE Degree of support available:   Strong    CURRENT CONCERNS Current Concerns  None Noted   Other Concerns:    SOCIAL WORK ASSESSMENT / PLAN Clinical Social Worker met with patient and patient wife at bedside to offer support and discuss patient needs at discharge.  Patient wife states that patient has been experiencing blood pressure issues and frequent dizziness with exertion.  Patient has been having to take frequent breaks while mowing the yard and working outside.  Patient wife states that the heat makes his symptoms worse. Patient was up on a 20 foot ladder when he experienced dizziness and fell.  Patient is not sure if the dizziness was the result or cause of his fall.  Patient lives at home with his wife and they are both retired and she plans to assist 24/7 at discharge.    Clinical Social Worker inquired about current substance use.  Patient states that he may drink one beer a week if any at all.  No drug use and no concerns regarding current alcohol use.  SBIRT complete.  CSW remains available for support and to assist with patient discharge needs once medically ready.   Assessment/plan status:  Psychosocial Support/Ongoing Assessment of Needs Other assessment/ plan:   Information/referral to community resources:   SBIRT complete with no resources needed.  CSW remains  available for additional resources throughout hospitalization.    PATIENT'S/FAMILY'S RESPONSE TO PLAN OF CARE: Patient alert and oriented x3 laying in bed.  Patient with good support from his wife at bedside with plans of continued support at home.  Patient and patient wife open to the idea of inpatient rehab if needed prior to discharge home.  Patient wife is able to provide 24/7 support. Patient and wife verbalized understanding of social work role and appreciation for support and concern.

## 2013-12-01 NOTE — Progress Notes (Signed)
Patient seen and examined.  Agree with PA's note. MRI switched to STAT.

## 2013-12-02 DIAGNOSIS — I369 Nonrheumatic tricuspid valve disorder, unspecified: Secondary | ICD-10-CM

## 2013-12-02 LAB — BASIC METABOLIC PANEL
ANION GAP: 14 (ref 5–15)
BUN: 21 mg/dL (ref 6–23)
CO2: 25 mEq/L (ref 19–32)
Calcium: 8.9 mg/dL (ref 8.4–10.5)
Chloride: 97 mEq/L (ref 96–112)
Creatinine, Ser: 1.4 mg/dL — ABNORMAL HIGH (ref 0.50–1.35)
GFR, EST AFRICAN AMERICAN: 59 mL/min — AB (ref >90)
GFR, EST NON AFRICAN AMERICAN: 50 mL/min — AB (ref >90)
Glucose, Bld: 103 mg/dL — ABNORMAL HIGH (ref 70–99)
POTASSIUM: 4.8 meq/L (ref 3.7–5.3)
SODIUM: 136 meq/L — AB (ref 137–147)

## 2013-12-02 LAB — CBC
HCT: 38.5 % — ABNORMAL LOW (ref 39.0–52.0)
Hemoglobin: 12.8 g/dL — ABNORMAL LOW (ref 13.0–17.0)
MCH: 31 pg (ref 26.0–34.0)
MCHC: 33.2 g/dL (ref 30.0–36.0)
MCV: 93.2 fL (ref 78.0–100.0)
PLATELETS: 169 10*3/uL (ref 150–400)
RBC: 4.13 MIL/uL — ABNORMAL LOW (ref 4.22–5.81)
RDW: 12.4 % (ref 11.5–15.5)
WBC: 8.8 10*3/uL (ref 4.0–10.5)

## 2013-12-02 MED ORDER — POLYVINYL ALCOHOL 1.4 % OP SOLN
1.0000 [drp] | OPHTHALMIC | Status: DC | PRN
  Administered 2013-12-02: 1 [drp] via OPHTHALMIC
  Filled 2013-12-02: qty 15

## 2013-12-02 NOTE — Progress Notes (Signed)
Echocardiogram 2D Echocardiogram has been performed.  12/02/2013 10:05 AM Maudry Mayhew, RVT, RDCS, RDMS

## 2013-12-02 NOTE — ED Provider Notes (Signed)
I saw and evaluated the patient, reviewed the resident's note and I agree with the findings and plan.   EKG Interpretation   Date/Time:  Monday November 30 2013 11:09:02 EDT Ventricular Rate:  83 PR Interval:  120 QRS Duration: 88 QT Interval:  405 QTC Calculation: 476 R Axis:   69 Text Interpretation:  Age not entered, assumed to be  68 years old for  purpose of ECG interpretation Sinus or ectopic atrial rhythm Ventricular  premature complex Borderline prolonged QT interval ED PHYSICIAN  INTERPRETATION AVAILABLE IN [facility] Confirmed by TEST, Record  ([id number]) on 12/02/2013 7:14:57 AM     Patient with fall from ladder. Hypotensive. Unsure of hypertension was pre-or post fall. Does have rib fractures on x-ray and spinal fractures on CT. Admitted to trauma surgery.  Jasper Riling. Alvino Chapel, MD 12/02/13 [phone]

## 2013-12-02 NOTE — Progress Notes (Signed)
    SUBJECTIVE:  No chest pain.  No SOB.  Positive back pain.    PHYSICAL EXAM Filed Vitals:   12/02/13 0400 12/02/13 0442 12/02/13 0507 12/02/13 0600  BP: 123/57  148/70 121/90  Pulse: 75  77 69  Temp:  97.6 F (36.4 C)    TempSrc:  Oral    Resp: 13  15 17   Height:      Weight:      SpO2: 94%  91% 97%   General:  No distress Lungs:  Clear Heart:  RRR Abdomen:  Positive bowel sounds, no rebound no guarding Extremities:  No edema  LABS: No results found for this basename: TROPONINI   Results for orders placed during the hospital encounter of 11/30/13 (from the past 24 hour(s))  CBC     Status: Abnormal   Collection Time    12/02/13  2:19 AM      Result Value Ref Range   WBC 8.8  4.0 - 10.5 K/uL   RBC 4.13 (*) 4.22 - 5.81 MIL/uL   Hemoglobin 12.8 (*) 13.0 - 17.0 g/dL   HCT 38.5 (*) 39.0 - 52.0 %   MCV 93.2  78.0 - 100.0 fL   MCH 31.0  26.0 - 34.0 pg   MCHC 33.2  30.0 - 36.0 g/dL   RDW 12.4  11.5 - 15.5 %   Platelets 169  150 - 400 K/uL  BASIC METABOLIC PANEL     Status: Abnormal   Collection Time    12/02/13  2:19 AM      Result Value Ref Range   Sodium 136 (*) 137 - 147 mEq/L   Potassium 4.8  3.7 - 5.3 mEq/L   Chloride 97  96 - 112 mEq/L   CO2 25  19 - 32 mEq/L   Glucose, Bld 103 (*) 70 - 99 mg/dL   BUN 21  6 - 23 mg/dL   Creatinine, Ser 1.40 (*) 0.50 - 1.35 mg/dL   Calcium 8.9  8.4 - 10.5 mg/dL   GFR calc non Af Amer 50 (*) >90 mL/min   GFR calc Af Amer 59 (*) >90 mL/min   Anion gap 14  5 - 15    Intake/Output Summary (Last 24 hours) at 12/02/13 1120 Last data filed at 12/02/13 0900  Gross per 24 hour  Intake   2515 ml  Output   1450 ml  Net   1065 ml    ASSESSMENT AND PLAN:  SYNCOPE:  Probably related to dehydration.  No evidence of cardiac arrhythmia.  He does not need telemetry at transfer.  Note echo with low normal EF.    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  This can be followed clinically.  No change in therapy at this point.     We will arrange a follow up appt.    Jeneen Rinks [facility] 12/02/2013 11:20 AM

## 2013-12-02 NOTE — Progress Notes (Signed)
Trauma Service Note  Subjective: Patient feeling much better this AM.  Still has back pain, and left hip hurts.  Objective: Vital signs in last 24 hours: Temp:  [97.6 F (36.4 C)-98.7 F (37.1 C)] 97.6 F (36.4 C) (07/22 0442) Pulse Rate:  [63-86] 69 (07/22 0600) Resp:  [12-19] 17 (07/22 0600) BP: (108-148)/(55-90) 121/90 mmHg (07/22 0600) SpO2:  [91 %-100 %] 97 % (07/22 0600)    Intake/Output from previous day: 07/21 0701 - 07/22 0700 In: 1540 [P.O.:240; I.V.:1300] Out: 1550 [Urine:1550] Intake/Output this shift:    General: No acute distress, ahs not been OOB  Lungs: clear  Abd: Benign although mildly distended  Extremities: No changes, normal ROM left hip  Neuro: Intact.  MRI findings are in the chart.  May need surgery, but seurosurgery has not seen the patient yet.  Lab Results: CBC   Recent Labs  12/01/13 0305 12/02/13 0219  WBC 9.7 8.8  HGB 12.4* 12.8*  HCT 36.7* 38.5*  PLT 199 169   BMET  Recent Labs  12/01/13 0305 12/02/13 0219  NA 138 136*  K 4.4 4.8  CL 100 97  CO2 24 25  GLUCOSE 104* 103*  BUN 31* 21  CREATININE 1.78* 1.40*  CALCIUM 8.2* 8.9   PT/INR  Recent Labs  11/30/13 1115  LABPROT 13.6  INR 1.04   ABG No results found for this basename: PHART, PCO2, PO2, HCO3,  in the last 72 hours  Studies/Results: Ct Head Wo Contrast  11/30/2013   CLINICAL DATA:  Syncope and subsequent fall from ladder  EXAM: CT HEAD WITHOUT CONTRAST  CT CERVICAL SPINE WITHOUT CONTRAST  TECHNIQUE: Multidetector CT imaging of the head and cervical spine was performed following the standard protocol without intravenous contrast. Multiplanar CT image reconstructions of the cervical spine were also generated.  COMPARISON:  None.  FINDINGS: CT HEAD FINDINGS  The ventricles are normal in size and position. There are basal ganglia calcifications bilaterally. There is faint cord plexus calcification in the temporal lobes. There is no intracranial hemorrhage nor  acute ischemic event. There are mild white matter density changes consistent with chronic small vessel ischemia. The cerebellum and brainstem are normal.  There is no acute skull fracture. The observed paranasal sinuses and mastoid air cells are clear.  CT CERVICAL SPINE FINDINGS  The cervical vertebral bodies are preserved in height. There is disc space narrowing at C2-3, C4-5, and C6-7. There are degenerative changes of the atlanto-dens articulation. The prevertebral soft tissues are normal. There is no perched facet nor facet or spinous process fracture. There are degenerative changes at multiple facet joint levels bilaterally. The bony ring at each cervical level is intact. The pulmonary apices are clear. The observed portions of the first and second ribs are normal.  IMPRESSION: 1. There is no acute intracranial hemorrhage nor other acute intracranial abnormality. There are mild changes of chronic small vessel ischemia. 2. There is no acute skull fracture. 3. There is no acute cervical spine fracture nor dislocation. There is multilevel degenerative disc change.   Electronically Signed   By: David  Martinique   On: 11/30/2013 12:25   Ct Chest W Contrast  11/30/2013   CLINICAL DATA:  Status post fall.  EXAM: CT CHEST, ABDOMEN, AND PELVIS WITH CONTRAST  TECHNIQUE: Multidetector CT imaging of the chest, abdomen and pelvis was performed following the standard protocol during bolus administration of intravenous contrast.  CONTRAST:  148mL OMNIPAQUE IOHEXOL 300 MG/ML SOLN164mL OMNIPAQUE IOHEXOL 300 MG/ML SOLN139mL OMNIPAQUE IOHEXOL  300 MG/ML SOLN  COMPARISON:  None.  FINDINGS: CT CHEST FINDINGS  No evidence of trauma to the great vessels is identified. Heart size is normal. No pleural or pericardial effusion. Calcific aortic atherosclerosis is noted. A small hiatal hernia is seen. There is no pneumothorax. The patient has a T11 compression fracture with approximately 30% vertebral body height loss. There is also a  transverse fracture through the lamina, facets and spinous process of T10 which is nondisplaced. Vertebral body alignment is maintained. Schmorl's nodes are noted.  CT ABDOMEN AND PELVIS FINDINGS  The gallbladder, liver, adrenal glands, spleen, pancreas and left kidney appear normal. Three nonobstructing right renal stones are identified measuring up to 0.7 cm. Right renal cyst is also noted. The small and large bowel and appendix appear normal. No focal bony abnormality is identified. Left L1 nondisplaced transverse process fracture is identified. Trace anterolisthesis of L5 on S1 due to facet arthropathy is noted.  IMPRESSION: Mild compression fracture of T11. There also appears to be a fracture through the posterior elements of T10. Please see report of dedicated thoracic spine CT scan this same day. Nondisplaced left L1 transverse process is also seen. No other evidence of trauma is seen.  Atherosclerosis.  Nonobstructing right renal stones.  Small hiatal hernia.   Electronically Signed   By: Inge Rise M.D.   On: 11/30/2013 12:26   Ct Cervical Spine Wo Contrast  11/30/2013   CLINICAL DATA:  Syncope and subsequent fall from ladder  EXAM: CT HEAD WITHOUT CONTRAST  CT CERVICAL SPINE WITHOUT CONTRAST  TECHNIQUE: Multidetector CT imaging of the head and cervical spine was performed following the standard protocol without intravenous contrast. Multiplanar CT image reconstructions of the cervical spine were also generated.  COMPARISON:  None.  FINDINGS: CT HEAD FINDINGS  The ventricles are normal in size and position. There are basal ganglia calcifications bilaterally. There is faint cord plexus calcification in the temporal lobes. There is no intracranial hemorrhage nor acute ischemic event. There are mild white matter density changes consistent with chronic small vessel ischemia. The cerebellum and brainstem are normal.  There is no acute skull fracture. The observed paranasal sinuses and mastoid air cells  are clear.  CT CERVICAL SPINE FINDINGS  The cervical vertebral bodies are preserved in height. There is disc space narrowing at C2-3, C4-5, and C6-7. There are degenerative changes of the atlanto-dens articulation. The prevertebral soft tissues are normal. There is no perched facet nor facet or spinous process fracture. There are degenerative changes at multiple facet joint levels bilaterally. The bony ring at each cervical level is intact. The pulmonary apices are clear. The observed portions of the first and second ribs are normal.  IMPRESSION: 1. There is no acute intracranial hemorrhage nor other acute intracranial abnormality. There are mild changes of chronic small vessel ischemia. 2. There is no acute skull fracture. 3. There is no acute cervical spine fracture nor dislocation. There is multilevel degenerative disc change.   Electronically Signed   By: David  Martinique   On: 11/30/2013 12:25   Ct Thoracic Spine Wo Contrast  11/30/2013   CLINICAL DATA:  Status post fall.  Back pain.  EXAM: CT THORACIC SPINE WITHOUT CONTRAST  TECHNIQUE: Multidetector CT imaging of the thoracic spine was performed without intravenous contrast administration. Multiplanar CT image reconstructions were also generated.  COMPARISON:  None.  FINDINGS: The patient has a mild comminuted compression fracture deformity of T11. Vertebral body height loss is estimated at 30%. There is minimal  bony retropulsion off the posterior margin of the vertebral body but the central canal appears open. Best visualized on the sagittal images is a nondisplaced fracture extending through the inferior margin of the inferior facets of T10, laminae and spinous process. No other fracture is identified. Nondisplaced fracture of the left transverse process of L1 is also identified. Schmorl's nodes in the mid thoracic spine are noted. Vertebral body alignment is maintained.  IMPRESSION: Acute, mild compression fracture of T11 with minimal bony retropulsion.  Nondisplaced fracture through the inferior T10 facet, lamina and spinous process is also identified.  Nondisplaced left L1 transverse process fracture.   Electronically Signed   By: Inge Rise M.D.   On: 11/30/2013 12:34   Ct Lumbar Spine Wo Contrast  11/30/2013   CLINICAL DATA:  Status post fall.  Back pain.  EXAM: CT LUMBAR SPINE WITHOUT CONTRAST  TECHNIQUE: Multidetector CT imaging of the lumbar spine was performed without intravenous contrast administration. Multiplanar CT image reconstructions were also generated.  COMPARISON:  None.  FINDINGS: Nondisplaced left L1 transverse process fracture is identified. No other fracture is seen with vertebral body height maintained. Trace anterolisthesis L5 on S1 due to facet degenerative disease is identified. There is convex right scoliosis. Imaged paraspinous structures demonstrate right renal stones and atherosclerotic vascular disease.  IMPRESSION: Nondisplaced left transverse process fracture L1. No other acute abnormality.   Electronically Signed   By: Inge Rise M.D.   On: 11/30/2013 12:36   Mr Cervical Spine Wo Contrast  12/02/2013   CLINICAL DATA:  Fall, loss of consciousness.  EXAM: MRI CERVICAL SPINE WITHOUT CONTRAST  TECHNIQUE: Multiplanar, multisequence MR imaging of the cervical spine was performed. No intravenous contrast was administered.  COMPARISON:  CT of the head and cervical spine November 30, 2013.  FINDINGS: Cervical vertebral bodies and posterior elements appear intact and aligned with maintenance of the cervical lordosis. Moderate to severe C6-7 degenerative disc disease, moderate at C4-5 with decreased T2 signal within all cervical discs most consistent with mild to moderate desiccation. Moderate to severe acute on chronic discogenic endplate changes Y7-0. No STIR signal abnormality to suggest fracture though, STIR sequence is mildly motion degraded.  Cervical spinal cord appears normal morphology and signal characteristics, no  syrinx or myelomalacia. No cerebellar tonsillar ectopia. Craniocervical junction appears intact. Included prevertebral and paraspinal soft tissues are nonsuspicious.  Level by level evaluation (vomiting motion degraded axial sequences limit evaluation):  C2-3: Uncovertebral hypertrophy, mild facet arthropathy without canal stenosis or definite neural foraminal narrowing.  C3-4: Canal bulging, uncovertebral hypertrophy. Severe right, mild left facet arthropathy. Mild canal stenosis with moderate to severe bilateral neural foraminal narrowing.  C4-5: Annular bulging, uncovertebral hypertrophy. Moderate to severe bilateral facet arthropathy. Mild canal stenosis with apparent severe bilateral neural foraminal narrowing.  C5-6: Minimal annular bulging, uncovertebral hypertrophy. Moderate right and severe left facet arthropathy. Mild canal stenosis. Apparent severe bilateral neural foraminal narrowing.  C6-7: 2 mm broad-based disc bulge, uncovertebral hypertrophy at least mild facet arthropathy. Mild to moderate canal stenosis with severe bilateral neural foraminal narrowing.  C7-T1: No disc bulge. Mild facet arthropathy without canal stenosis or neural foraminal narrowing.  IMPRESSION: Cervical spondylosis without acute fracture nor malalignment. Mild motion degraded examination without convincing evidence of cord contusion.  Mild to moderate canal stenosis at C6-7, mild at C3-4 through C5-6. Neural foraminal narrowing C3-4 thru C6-7: Appearing severe at C4-5 through C6-7.   Electronically Signed   By: Elon Alas   On: 12/02/2013 01:05  Mr Thoracic Spine Wo Contrast  12/02/2013   ADDENDUM REPORT: 12/02/2013 01:53  ADDENDUM: Findings discussed with and reconfirmed by Dr.STERN on7/22/2015at1:50 am.   Electronically Signed   By: Elon Alas   On: 12/02/2013 01:53   12/02/2013   CLINICAL DATA:  Fall, loss of consciousness.  EXAM: MRI THORACIC AND LUMBAR SPINE WITHOUT CONTRAST  TECHNIQUE: Multiplanar and  multiecho pulse sequences of the thoracic and lumbar spine were obtained without intravenous contrast.  COMPARISON:  CT of the chest, abdomen and pelvis December 08, 2013  FINDINGS: MR THORACIC SPINE FINDINGS  Mild-to-moderate 3 column burst fracture T11 with less than 20% height loss, 1-2 mm retropulsed bony fragments. No malalignment. In addition, oblique nondisplaced fracture of T2 involving anterior and middle column without retropulsed bony fragments. Nondisplaced fractures of the posterior elements of T10 better seen on prior CT.  Thoracic kyphosis maintained. Intervertebral disc demonstrate mild desiccation, with slight edema within mid T1 to disc, edema within the T10-11 and T11-12 discs. Scattered chronic Schmorl's nodes.  Thoracic spinal cord appears normal morphology and signal characteristics the level of T12-L1 with the conus medullaris terminates. Upper thoracic grade 1 paraspinal muscle strain. Mid to lower thoracic grade 1/2 paraspinal muscle strain. Small bilateral pleural effusions.  Annular bulging at T5-6. Right central small disc protrusion at T8-9. Mild lower lumbar facet arthropathy. No canal stenosis or neural foraminal narrowing at any level.  MR LUMBAR SPINE FINDINGS  Grade 1 L5-S1 anterolisthesis, perched left L5-S1 facet with fractured left L5 inferior articular facet, capsular edema. Widened L5-S1 interspinous space with bright signal consistent with ligamentous injury. Lumbar vertebra are intact. Maintenance of lumbar lordosis. The left L1 transverse process fracture seen on prior examination is less conspicuous by MR imaging. Intervertebral disc heights generally preserved, with edema within the L5-S1 disc, as well as T11-12. Remaining discs are mildly desiccated, with equivocal disc injury at L3-4.  Conus medullaris terminates at T12-L1 and appears normal in morphology and signal characteristics. Cauda equina is nonsuspicious. Interstitial edema about the left L5 facet capsule mid  concerning for injury. Dependent subcutaneous edema with left greater than right grade 1 paraspinal muscle strain versus early denervation.  Level by level evaluation:  L1-2: Small extra foraminal right disc protrusion. Mild to moderate facet arthropathy and ligamentum flavum redundancy without canal stenosis. Minimal neural foraminal narrowing.  L2-3: Minimal annular bulging. Mild facet arthropathy and ligamentum flavum redundancy with trace facet effusions which are likely reactive. No canal stenosis. Minimal neural foraminal narrowing bilaterally.  L3-4: 3 mm broad-based disc bulge 5 mm nodular density posterior to the exiting right L3 nerve may reflect synovial cyst. Mild facet arthropathy and ligamentum flavum redundancy without canal stenosis. Moderate right and moderate to severe left neural foraminal narrowing.  L4-5: Annular bulging, with left subarticular annular fissure. Moderate facet arthropathy and ligamentum flavum redundancy. Moderate facet arthropathy and ligamentum flavum redundancy, with trace left facet effusion which is likely reactive. No canal stenosis. Subcentimeter left facet synovial cyst directed posterior to the paraspinal soft tissues. Severe left greater than right neural foraminal narrowing.  L5-S1: Moderate left central disc protrusion effacing the traversing left S1 nerve. Moderate to severe facet arthropathy, no canal stenosis. Moderate to severe right and at least moderate left neural foraminal narrowing.  IMPRESSION: MR THORACIC SPINE IMPRESSION  Three column unstable T11 burst fracture with less than 20% height loss. No canal stenosis or neural foraminal narrowing.  Nondisplaced T2 2 column burst fracture without retropulsed bony fragments.  Nondisplaced T10 posterior  element fracture.  No malalignment nor neurocompressive changes. Upper thoracic grade 1 paraspinal muscle strain, mid to lower thoracic grade 1/2 paraspinal muscle strain.  MR LUMBAR SPINE IMPRESSION  Grade 1 L5-S1  anterolisthesis, with perched left L5-S1 facet, fractured left L5 inferior articular facet, left L5-S1 capsular injury with interspinous L5-S1 ligamentous injury.  Left greater than right low-grade paraspinal muscle strain.  L4-5 annular fissure.  Possible L3-4 intradiscal injury.  No canal stenosis. Neural foraminal narrowing L2-3 through L5-S1: Severe at L4-5 bilaterally.  Electronically Signed: By: Elon Alas On: 12/02/2013 01:37   Mr Lumbar Spine Wo Contrast  12/02/2013   ADDENDUM REPORT: 12/02/2013 01:53  ADDENDUM: Findings discussed with and reconfirmed by Dr.STERN on7/22/2015at1:50 am.   Electronically Signed   By: Elon Alas   On: 12/02/2013 01:53   12/02/2013   CLINICAL DATA:  Fall, loss of consciousness.  EXAM: MRI THORACIC AND LUMBAR SPINE WITHOUT CONTRAST  TECHNIQUE: Multiplanar and multiecho pulse sequences of the thoracic and lumbar spine were obtained without intravenous contrast.  COMPARISON:  CT of the chest, abdomen and pelvis December 08, 2013  FINDINGS: MR THORACIC SPINE FINDINGS  Mild-to-moderate 3 column burst fracture T11 with less than 20% height loss, 1-2 mm retropulsed bony fragments. No malalignment. In addition, oblique nondisplaced fracture of T2 involving anterior and middle column without retropulsed bony fragments. Nondisplaced fractures of the posterior elements of T10 better seen on prior CT.  Thoracic kyphosis maintained. Intervertebral disc demonstrate mild desiccation, with slight edema within mid T1 to disc, edema within the T10-11 and T11-12 discs. Scattered chronic Schmorl's nodes.  Thoracic spinal cord appears normal morphology and signal characteristics the level of T12-L1 with the conus medullaris terminates. Upper thoracic grade 1 paraspinal muscle strain. Mid to lower thoracic grade 1/2 paraspinal muscle strain. Small bilateral pleural effusions.  Annular bulging at T5-6. Right central small disc protrusion at T8-9. Mild lower lumbar facet arthropathy. No  canal stenosis or neural foraminal narrowing at any level.  MR LUMBAR SPINE FINDINGS  Grade 1 L5-S1 anterolisthesis, perched left L5-S1 facet with fractured left L5 inferior articular facet, capsular edema. Widened L5-S1 interspinous space with bright signal consistent with ligamentous injury. Lumbar vertebra are intact. Maintenance of lumbar lordosis. The left L1 transverse process fracture seen on prior examination is less conspicuous by MR imaging. Intervertebral disc heights generally preserved, with edema within the L5-S1 disc, as well as T11-12. Remaining discs are mildly desiccated, with equivocal disc injury at L3-4.  Conus medullaris terminates at T12-L1 and appears normal in morphology and signal characteristics. Cauda equina is nonsuspicious. Interstitial edema about the left L5 facet capsule mid concerning for injury. Dependent subcutaneous edema with left greater than right grade 1 paraspinal muscle strain versus early denervation.  Level by level evaluation:  L1-2: Small extra foraminal right disc protrusion. Mild to moderate facet arthropathy and ligamentum flavum redundancy without canal stenosis. Minimal neural foraminal narrowing.  L2-3: Minimal annular bulging. Mild facet arthropathy and ligamentum flavum redundancy with trace facet effusions which are likely reactive. No canal stenosis. Minimal neural foraminal narrowing bilaterally.  L3-4: 3 mm broad-based disc bulge 5 mm nodular density posterior to the exiting right L3 nerve may reflect synovial cyst. Mild facet arthropathy and ligamentum flavum redundancy without canal stenosis. Moderate right and moderate to severe left neural foraminal narrowing.  L4-5: Annular bulging, with left subarticular annular fissure. Moderate facet arthropathy and ligamentum flavum redundancy. Moderate facet arthropathy and ligamentum flavum redundancy, with trace left facet effusion which is likely  reactive. No canal stenosis. Subcentimeter left facet synovial  cyst directed posterior to the paraspinal soft tissues. Severe left greater than right neural foraminal narrowing.  L5-S1: Moderate left central disc protrusion effacing the traversing left S1 nerve. Moderate to severe facet arthropathy, no canal stenosis. Moderate to severe right and at least moderate left neural foraminal narrowing.  IMPRESSION: MR THORACIC SPINE IMPRESSION  Three column unstable T11 burst fracture with less than 20% height loss. No canal stenosis or neural foraminal narrowing.  Nondisplaced T2 2 column burst fracture without retropulsed bony fragments.  Nondisplaced T10 posterior element fracture.  No malalignment nor neurocompressive changes. Upper thoracic grade 1 paraspinal muscle strain, mid to lower thoracic grade 1/2 paraspinal muscle strain.  MR LUMBAR SPINE IMPRESSION  Grade 1 L5-S1 anterolisthesis, with perched left L5-S1 facet, fractured left L5 inferior articular facet, left L5-S1 capsular injury with interspinous L5-S1 ligamentous injury.  Left greater than right low-grade paraspinal muscle strain.  L4-5 annular fissure.  Possible L3-4 intradiscal injury.  No canal stenosis. Neural foraminal narrowing L2-3 through L5-S1: Severe at L4-5 bilaterally.  Electronically Signed: By: Elon Alas On: 12/02/2013 01:37   Ct Abdomen Pelvis W Contrast  11/30/2013   CLINICAL DATA:  Status post fall.  EXAM: CT CHEST, ABDOMEN, AND PELVIS WITH CONTRAST  TECHNIQUE: Multidetector CT imaging of the chest, abdomen and pelvis was performed following the standard protocol during bolus administration of intravenous contrast.  CONTRAST:  11mL OMNIPAQUE IOHEXOL 300 MG/ML SOLN135mL OMNIPAQUE IOHEXOL 300 MG/ML SOLN144mL OMNIPAQUE IOHEXOL 300 MG/ML SOLN  COMPARISON:  None.  FINDINGS: CT CHEST FINDINGS  No evidence of trauma to the great vessels is identified. Heart size is normal. No pleural or pericardial effusion. Calcific aortic atherosclerosis is noted. A small hiatal hernia is seen. There is no  pneumothorax. The patient has a T11 compression fracture with approximately 30% vertebral body height loss. There is also a transverse fracture through the lamina, facets and spinous process of T10 which is nondisplaced. Vertebral body alignment is maintained. Schmorl's nodes are noted.  CT ABDOMEN AND PELVIS FINDINGS  The gallbladder, liver, adrenal glands, spleen, pancreas and left kidney appear normal. Three nonobstructing right renal stones are identified measuring up to 0.7 cm. Right renal cyst is also noted. The small and large bowel and appendix appear normal. No focal bony abnormality is identified. Left L1 nondisplaced transverse process fracture is identified. Trace anterolisthesis of L5 on S1 due to facet arthropathy is noted.  IMPRESSION: Mild compression fracture of T11. There also appears to be a fracture through the posterior elements of T10. Please see report of dedicated thoracic spine CT scan this same day. Nondisplaced left L1 transverse process is also seen. No other evidence of trauma is seen.  Atherosclerosis.  Nonobstructing right renal stones.  Small hiatal hernia.   Electronically Signed   By: Inge Rise M.D.   On: 11/30/2013 12:26   Dg Pelvis Portable  11/30/2013   CLINICAL DATA:  Status post fall from a ladder.  EXAM: PORTABLE PELVIS 1-2 VIEWS  COMPARISON:  None.  FINDINGS: No acute bony or joint abnormality is identified. Joint spaces are preserved. Soft tissue structures are unremarkable.  IMPRESSION: No acute finding.   Electronically Signed   By: Inge Rise M.D.   On: 11/30/2013 11:38   Dg Chest Port 1 View  11/30/2013   CLINICAL DATA:  Status post fall complaining of chest and rib pain  EXAM: PORTABLE CHEST - 1 VIEW  COMPARISON:  None.  FINDINGS: The  lungs are adequately inflated. There is no pneumothorax or pleural effusion. The heart and mediastinal structures are within the limits of normal. There is a minimally displaced fracture of the anterior lateral aspect of  the left eighth rib. An adjacent ninth rib fracture may be present.  IMPRESSION: There is no acute parenchymal abnormality of the lungs or mediastinal structures. There is fracture of the left eighth and possibly adjacent ninth ribs. A rib detail series is recommended.   Electronically Signed   By: David  Martinique   On: 11/30/2013 11:37    Anti-infectives: Anti-infectives   None      Assessment/Plan: s/p  Await decision of NS.  If no surgery planned, will get PT/OT and transfer from ICU  LOS: 2 days   Kathryne Eriksson. Dahlia Bailiff, MD, FACS [phone] Trauma Surgeon 12/02/2013

## 2013-12-02 NOTE — Progress Notes (Signed)
T.O. Received from Dr. Donne Hazel to give eye gtts to patient who has burning, dry eyes and pain. Will place order and continue to monitor accordingly.  Late Entry 0049-Patient arrived to floor from MRI with transporter [place], patient didn't have any complaints nor distress.

## 2013-12-02 NOTE — Progress Notes (Signed)
Patient states that his back pain is better. Still has a little bit of numbness into his left lower trimming his chronic and dates back to previous decompressive surgery on the left at L5-S1. Denies any weakness.  On examination he is awake and alert. He is oriented and appropriate. Motor examination is intact throughout. Sensory examination reveals some left L5 and S1 chronic sensory loss but otherwise sensory exam intact. Reflexes are normal.  Every patient MRI scan of his thoracic spine. This demonstrates evidence of a mild anterior fracture of T2 which should heal without intervention. The patient's T11 fracture is well aligned. There is minimal retropulsion. There is no evidence of disc herniation or epidural hematoma this level.  Lumbar MRI scan demonstrates evidence of the patient's pre-existing L5-S1 degenerative spondylolisthesis. Patient does have postoperative change on the left and has some degree of facet fracture which may or may not predate this injury.  I am hopeful that it is T11 fracture and his lumbar fractures will heal with immobilization alone. Plan to mobilize with TLSO. We'll check followup upright x-rays after patient has begun mobilizing.

## 2013-12-03 ENCOUNTER — Encounter (HOSPITAL_COMMUNITY): Payer: Self-pay | Admitting: Physical Medicine and Rehabilitation

## 2013-12-03 ENCOUNTER — Inpatient Hospital Stay (HOSPITAL_COMMUNITY): Payer: Medicare Other

## 2013-12-03 DIAGNOSIS — IMO0002 Reserved for concepts with insufficient information to code with codable children: Secondary | ICD-10-CM

## 2013-12-03 DIAGNOSIS — Q762 Congenital spondylolisthesis: Secondary | ICD-10-CM

## 2013-12-03 MED ORDER — TIZANIDINE HCL 4 MG PO TABS
4.0000 mg | ORAL_TABLET | Freq: Three times a day (TID) | ORAL | Status: DC | PRN
  Administered 2013-12-03 (×2): 4 mg via ORAL
  Filled 2013-12-03 (×2): qty 1

## 2013-12-03 NOTE — Progress Notes (Signed)
Rehab Admissions Coordinator Note:  Patient was screened by Retta Diones for appropriateness for an Inpatient Acute Rehab Consult.  At this time, we are recommending Inpatient Rehab consult.  Retta Diones 12/03/2013, 12:40 PM  I can be reached at [phone].

## 2013-12-03 NOTE — Progress Notes (Signed)
Patient ID: Alexander Cox, male   DOB: Mar 05, 1946, 68 y.o.   MRN: [id number]    Subjective: A lot of shooting pain down his right leg when sat up with brace yesterday  Objective: Vital signs in last 24 hours: Temp:  [98.1 F (36.7 C)-99 F (37.2 C)] 98.4 F (36.9 C) (07/23 0305) Pulse Rate:  [63-80] 64 (07/23 0700) Resp:  [9-18] 13 (07/23 0700) BP: (107-144)/(53-121) 108/70 mmHg (07/23 0700) SpO2:  [92 %-99 %] 93 % (07/23 0700) Last BM Date:  (PTA)  Intake/Output from previous day: 07/22 0701 - 07/23 0700 In: 2520 [P.O.:120; I.V.:2400] Out: 1900 [Urine:1900] Intake/Output this shift:    General appearance: alert and cooperative Resp: clear to auscultation bilaterally Cardio: regular rate and rhythm GI: soft, NT, active BS Neuro: MAE, decreased LT sensation R ball of foot area  Lab Results: CBC   Recent Labs  12/01/13 0305 12/02/13 0219  WBC 9.7 8.8  HGB 12.4* 12.8*  HCT 36.7* 38.5*  PLT 199 169   BMET  Recent Labs  12/01/13 0305 12/02/13 0219  NA 138 136*  K 4.4 4.8  CL 100 97  CO2 24 25  GLUCOSE 104* 103*  BUN 31* 21  CREATININE 1.78* 1.40*  CALCIUM 8.2* 8.9   PT/INR  Recent Labs  11/30/13 1115  LABPROT 13.6  INR 1.04    Anti-infectives: Anti-infectives   None      Assessment/Plan: Fall Multiple left rib fxs -- Pulmonary toilet T10-11 fxs -- plan TLSO per Dr. Annette Stable, brace too loose - [employer] to change, had a lot of pain sitting up yesterday - will see how it goes with therapies. Add MM relaxer. Syncope -- Likely dehydration 2/2 BP meds, appreciate cardiology consult ABL anemia -- Mild, follow AKI -- decrease IVF and encourage POs, re-check in AM HTN/Dyslipidemia FEN -- SL IV VTE -- SCD's, Lovenox Dispo -- ICU for now pending brace/mobilization   LOS: 3 days    Georganna Skeans, MD, MPH, FACS Trauma: [phone] General Surgery: [phone]  12/03/2013

## 2013-12-03 NOTE — Consult Note (Signed)
Physical Medicine and Rehabilitation Consult  Reason for Consult: T 11 burst fracture, paraspinal muscle strain, L5 radiculopathy.  Referring Physician: Dr. Grandville Silos   HPI: Alexander Cox is a 68 y.o. male with history of celiac disease, HTN with recent medication changes with episodic of dizziness and near syncope. On 11/30/13, he was up 20 feet on a ladder when he developed recurrent dizziness but continued working.  He had LOC with fall and onset of lower thoracic pain. In ED he was found to be hypotensive with SBP in 80's and MRI of spine revealed unstable T-11 burst fracture, non displaced T2 fracture as well as non-displaced fracture of T- 10 posterior elements, L5/S1 anterolisthesis with ligamentous injury and left greater than right low grade paraspinal muscle strain.  He was noted to be dehydrated and hypotension improved with IVF.  Dr. Annette Stable consulted and recommended immobilization with TLSO as patient without neurologic symptoms.  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  Cardiology recommended holding BP medications and 2D echo done revealing EF 50-55% with grade 1 diastolic dysfunction. PT/OT evaluations done today and CIR recommended by Rehab team and MD.   Review of Systems  HENT: Negative for hearing loss and tinnitus.   Eyes: Positive for blurred vision (Wears trifocals and lost his glasses during the fall. ).  Respiratory: Negative for cough and shortness of breath.   Cardiovascular: Negative for chest pain and palpitations.  Gastrointestinal: Positive for heartburn and constipation. Negative for nausea, vomiting, abdominal pain and diarrhea.  Genitourinary: Negative for dysuria and urgency.  Musculoskeletal: Positive for back pain and myalgias.  Neurological: Positive for dizziness and tingling  (RLE tingling decreased today). Negative for focal weakness and headaches.  Psychiatric/Behavioral: Negative for depression. The patient has insomnia (due to pain).     Past Medical History  Diagnosis Date  . Hypertension   . High cholesterol   . Celiac disease     History reviewed. No pertinent past surgical history. Family History  Problem Relation Age of Onset  . Hypertension Paternal Grandfather     Social History:    Married. Independent PTA. 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 He reports that he has quit smoking. He uses smokeless tobacco--chews 3-4/day. He reports that he does not drink alcohol or use illicit drugs.   Allergies  Allergen Reactions  . Gluten Meal     Medications Prior to Admission  Medication Sig Dispense Refill  . aspirin EC 81 MG tablet Take 81 mg by mouth daily.      . calcium carbonate (OS-CAL) 600 MG TABS tablet Take 600 mg by mouth daily.      . Cholecalciferol (VITAMIN D PO) Take 1,000 Units by mouth daily.      . hydrochlorothiazide (HYDRODIURIL) 12.5 MG tablet Take 12.5 mg by mouth daily.      Marland Kitchen lisinopril (PRINIVIL,ZESTRIL) 20 MG tablet Take 10 mg by mouth 2 (two) times daily.      . Multiple Vitamin (MULTIVITAMIN WITH MINERALS) TABS tablet Take 1 tablet by mouth daily.      . Omega-3 Fatty Acids (FISH OIL PO) Take 1 capsule by mouth daily.       . simvastatin (ZOCOR) 20 MG tablet Take 20 mg by mouth daily.        Home: Home Living Family/patient expects to be discharged to:: Private residence  Living Arrangements: Spouse/significant other Available Help at Discharge: Family Type of Home: House Home Access: Stairs to enter [employer] of Steps: 5 Entrance Stairs-Rails: None Home Layout: One level Home Equipment: [place] - 4 wheels;Other (comment) (was his mother's) Additional Comments: tub shower with curtain, standard height toliets  Functional History: Prior Function Level of Independence: Independent Functional  Status:  Mobility: Bed Mobility Overal bed mobility: Needs Assistance Bed Mobility: Rolling;Sidelying to Sit Rolling: Mod assist Sidelying to sit: Max assist General bed mobility comments: significant increased pain with mobility, rolling side to side for donning of TLSO brace, assist to come to upright positioning, VCs for technique and sequencing Transfers Overall transfer level: Needs assistance Equipment used: Rolling walker (2 wheeled) Transfers: Sit to/from Stand Sit to Stand: Mod assist;+2 physical assistance General transfer comment: Increased assist, VCs for hand placement and safety, patient with significant pain during transitional movement, assist to elevate to standing and to provide support in standingf Ambulation/Gait Ambulation/Gait assistance: Min assist Ambulation Distance (Feet): 12 Feet Assistive device: Rolling walker (2 wheeled) Gait Pattern/deviations: Step-to pattern;Decreased stride length;Decreased weight shift to right;Shuffle;Antalgic;Trunk flexed;Narrow base of support Gait velocity: decreased Gait velocity interpretation: <1.8 ft/sec, indicative of risk for recurrent falls General Gait Details: Patient limited by pain, difficulty coming down through RLE secondary to increased hip pain    ADL: ADL Overall ADL's : Needs assistance/impaired Grooming: Minimal assistance;Sitting Grooming Details (indicate cue type and reason): needs to be supported. Unable to sit EOB without support of BUE Upper Body Bathing: Minimal assitance;Sitting Lower Body Bathing: Maximal assistance;Sit to/from stand Upper Body Dressing : Moderate assistance;Sitting Lower Body Dressing: Maximal assistance;Sit to/from stand Toilet Transfer: +2 for physical assistance;Moderate assistance Toileting- Clothing Manipulation and Hygiene: Maximal assistance;Sit to/from stand (to follow back precautions with TLSO) Functional mobility during ADLs: +2 for physical assistance;Moderate  assistance General ADL Comments: Began education on back precautions and ADL. TLSO donned in supine  Cognition: Cognition Overall Cognitive Status: Within Functional Limits for tasks assessed Orientation Level: Oriented X4 Cognition Arousal/Alertness: Awake/alert Behavior During Therapy: WFL for tasks assessed/performed Overall Cognitive Status: Within Functional Limits for tasks assessed  Blood pressure 114/65, pulse 80, temperature 97.8 F (36.6 C), temperature source Oral, resp. rate 19, height 5\' 7"  (1.702 m), weight 70.9 kg (156 lb 4.9 oz), SpO2 99.00%. Physical Exam  Nursing note and vitals reviewed. Constitutional: He is oriented to person, place, and time. He appears well-developed and well-nourished.  HENT:  Head: Normocephalic and atraumatic.  Eyes: Conjunctivae are normal. Pupils are equal, round, and reactive to light.  Neck: Normal range of motion. Neck supple.  Cardiovascular: Normal rate and regular rhythm.   No murmur heard. Respiratory: Effort normal and breath sounds normal. No respiratory distress. He has no wheezes.  GI: Soft. Bowel sounds are normal. He exhibits no distension. There is no tenderness.  Musculoskeletal: He exhibits no edema and no tenderness.  Pain right hip with SLR.   Neurological: He is alert and oriented to person, place, and time.  Skin: Skin is warm and dry.  Psychiatric: He has a normal mood and affect. His behavior is normal. Thought content normal.   no pain with straight leg raising bilaterally no pain with hip motion bilaterally Motor strength is 5/5 bilateral deltoid, bicep, tricep, grip 4 minus/5 in the right hip flexor knee extensor ankle dorsiflexor plantar flexor 5/5 left hip flexor knee extensor ankle dorsiflexor plantar flexor Sensation reduced to light touch right great toe  No results found for this or any previous  visit (from the past 24 hour(s)). Dg Thoracic Spine 2 View   12/03/2013   CLINICAL DATA:  Thoracic spine  fracture or  EXAM: THORACIC SPINE - 2 VIEW  COMPARISON:  CT thoracic spine 11/30/2013  FINDINGS: Again noted is a burst fracture at T11 which was better characterized on the recent CT of the thoracic spine dated 11/30/2013. No significant interval change. Partially visualized is a nondisplaced fracture of the posterior elements of T10. The alignment is anatomic. There is no static listhesis. The disc spaces are maintained.  The visualized portions of the lungs are clear.  IMPRESSION: 1. Stable T11 burst fracture. 2. Stable nondisplaced T10 posterior element fracture.   Electronically Signed   By: Kathreen Devoid   On: 12/03/2013 15:41   Dg Lumbar Spine 2-3 Views  12/03/2013   CLINICAL DATA:  T11 fracture.  Standing in brace lumbar spine  EXAM: LUMBAR SPINE - 2-3 VIEW  COMPARISON:  11/30/2013 lumbar spine CT  FINDINGS: The nondisplaced left transverse process fracture at L1 is essentially radiographically occult, but was seen at recent CT scan.  Right nephrolithiasis. 3 mm of grade 1 anterolisthesis at L4-5 with facet arthropathy especially on the left at L4-5 and L5-S1. There is 4 mm anterolisthesis at L5-S1.  The T11 level is not included on today's lumbar spine exam.  IMPRESSION: 1. Stable appearance of the lumbar spine, with left facet arthropathy at L4-5 and L5-S1, and grade 1 anterolisthesis at both of these levels as well. 2. The known left L1 transverse process fracture is not radiographically visible. 3. Right nephrolithiasis.   Electronically Signed   By: Sherryl Barters M.D.   On: 12/03/2013 15:41   Mr Cervical Spine Wo Contrast  12/02/2013   CLINICAL DATA:  Fall, loss of consciousness.  EXAM: MRI CERVICAL SPINE WITHOUT CONTRAST  TECHNIQUE: Multiplanar, multisequence MR imaging of the cervical spine was performed. No intravenous contrast was administered.  COMPARISON:  CT of the head and cervical spine November 30, 2013.  FINDINGS: Cervical vertebral bodies and posterior elements appear intact and aligned  with maintenance of the cervical lordosis. Moderate to severe C6-7 degenerative disc disease, moderate at C4-5 with decreased T2 signal within all cervical discs most consistent with mild to moderate desiccation. Moderate to severe acute on chronic discogenic endplate changes H8-2. No STIR signal abnormality to suggest fracture though, STIR sequence is mildly motion degraded.  Cervical spinal cord appears normal morphology and signal characteristics, no syrinx or myelomalacia. No cerebellar tonsillar ectopia. Craniocervical junction appears intact. Included prevertebral and paraspinal soft tissues are nonsuspicious.  Level by level evaluation (vomiting motion degraded axial sequences limit evaluation):  C2-3: Uncovertebral hypertrophy, mild facet arthropathy without canal stenosis or definite neural foraminal narrowing.  C3-4: Canal bulging, uncovertebral hypertrophy. Severe right, mild left facet arthropathy. Mild canal stenosis with moderate to severe bilateral neural foraminal narrowing.  C4-5: Annular bulging, uncovertebral hypertrophy. Moderate to severe bilateral facet arthropathy. Mild canal stenosis with apparent severe bilateral neural foraminal narrowing.  C5-6: Minimal annular bulging, uncovertebral hypertrophy. Moderate right and severe left facet arthropathy. Mild canal stenosis. Apparent severe bilateral neural foraminal narrowing.  C6-7: 2 mm broad-based disc bulge, uncovertebral hypertrophy at least mild facet arthropathy. Mild to moderate canal stenosis with severe bilateral neural foraminal narrowing.  C7-T1: No disc bulge. Mild facet arthropathy without canal stenosis or neural foraminal narrowing.  IMPRESSION: Cervical spondylosis without acute fracture nor malalignment. Mild motion degraded examination without convincing evidence of cord contusion.  Mild to moderate canal stenosis  at C6-7, mild at C3-4 through C5-6. Neural foraminal narrowing C3-4 thru C6-7: Appearing severe at C4-5 through  C6-7.   Electronically Signed   By: Elon Alas   On: 12/02/2013 01:05   Mr Thoracic Spine Wo Contrast  12/02/2013   ADDENDUM REPORT: 12/02/2013 01:53  ADDENDUM: Findings discussed with and reconfirmed by Dr.STERN on7/22/2015at1:50 am.   Electronically Signed   By: Elon Alas   On: 12/02/2013 01:53   12/02/2013   CLINICAL DATA:  Fall, loss of consciousness.  EXAM: MRI THORACIC AND LUMBAR SPINE WITHOUT CONTRAST  TECHNIQUE: Multiplanar and multiecho pulse sequences of the thoracic and lumbar spine were obtained without intravenous contrast.  COMPARISON:  CT of the chest, abdomen and pelvis December 08, 2013  FINDINGS: MR THORACIC SPINE FINDINGS  Mild-to-moderate 3 column burst fracture T11 with less than 20% height loss, 1-2 mm retropulsed bony fragments. No malalignment. In addition, oblique nondisplaced fracture of T2 involving anterior and middle column without retropulsed bony fragments. Nondisplaced fractures of the posterior elements of T10 better seen on prior CT.  Thoracic kyphosis maintained. Intervertebral disc demonstrate mild desiccation, with slight edema within mid T1 to disc, edema within the T10-11 and T11-12 discs. Scattered chronic Schmorl's nodes.  Thoracic spinal cord appears normal morphology and signal characteristics the level of T12-L1 with the conus medullaris terminates. Upper thoracic grade 1 paraspinal muscle strain. Mid to lower thoracic grade 1/2 paraspinal muscle strain. Small bilateral pleural effusions.  Annular bulging at T5-6. Right central small disc protrusion at T8-9. Mild lower lumbar facet arthropathy. No canal stenosis or neural foraminal narrowing at any level.  MR LUMBAR SPINE FINDINGS  Grade 1 L5-S1 anterolisthesis, perched left L5-S1 facet with fractured left L5 inferior articular facet, capsular edema. Widened L5-S1 interspinous space with bright signal consistent with ligamentous injury. Lumbar vertebra are intact. Maintenance of lumbar lordosis. The left L1  transverse process fracture seen on prior examination is less conspicuous by MR imaging. Intervertebral disc heights generally preserved, with edema within the L5-S1 disc, as well as T11-12. Remaining discs are mildly desiccated, with equivocal disc injury at L3-4.  Conus medullaris terminates at T12-L1 and appears normal in morphology and signal characteristics. Cauda equina is nonsuspicious. Interstitial edema about the left L5 facet capsule mid concerning for injury. Dependent subcutaneous edema with left greater than right grade 1 paraspinal muscle strain versus early denervation.  Level by level evaluation:  L1-2: Small extra foraminal right disc protrusion. Mild to moderate facet arthropathy and ligamentum flavum redundancy without canal stenosis. Minimal neural foraminal narrowing.  L2-3: Minimal annular bulging. Mild facet arthropathy and ligamentum flavum redundancy with trace facet effusions which are likely reactive. No canal stenosis. Minimal neural foraminal narrowing bilaterally.  L3-4: 3 mm broad-based disc bulge 5 mm nodular density posterior to the exiting right L3 nerve may reflect synovial cyst. Mild facet arthropathy and ligamentum flavum redundancy without canal stenosis. Moderate right and moderate to severe left neural foraminal narrowing.  L4-5: Annular bulging, with left subarticular annular fissure. Moderate facet arthropathy and ligamentum flavum redundancy. Moderate facet arthropathy and ligamentum flavum redundancy, with trace left facet effusion which is likely reactive. No canal stenosis. Subcentimeter left facet synovial cyst directed posterior to the paraspinal soft tissues. Severe left greater than right neural foraminal narrowing.  L5-S1: Moderate left central disc protrusion effacing the traversing left S1 nerve. Moderate to severe facet arthropathy, no canal stenosis. Moderate to severe right and at least moderate left neural foraminal narrowing.  IMPRESSION: MR THORACIC SPINE  IMPRESSION  Three column unstable T11 burst fracture with less than 20% height loss. No canal stenosis or neural foraminal narrowing.  Nondisplaced T2 2 column burst fracture without retropulsed bony fragments.  Nondisplaced T10 posterior element fracture.  No malalignment nor neurocompressive changes. Upper thoracic grade 1 paraspinal muscle strain, mid to lower thoracic grade 1/2 paraspinal muscle strain.  MR LUMBAR SPINE IMPRESSION  Grade 1 L5-S1 anterolisthesis, with perched left L5-S1 facet, fractured left L5 inferior articular facet, left L5-S1 capsular injury with interspinous L5-S1 ligamentous injury.  Left greater than right low-grade paraspinal muscle strain.  L4-5 annular fissure.  Possible L3-4 intradiscal injury.  No canal stenosis. Neural foraminal narrowing L2-3 through L5-S1: Severe at L4-5 bilaterally.  Electronically Signed: By: Elon Alas On: 12/02/2013 01:37    Assessment/Plan: Diagnosis: Multitrauma with T11 burst fracture as well as spondylolisthesis with right L5 radiculopathy after a fall 11/30/2013 1. Does the need for close, 24 hr/day medical supervision in concert with the patient's rehab needs make it unreasonable for this patient to be served in a less intensive setting? Yes 2. Co-Morbidities requiring supervision/potential complications: Hypertension, dehydration, pain management, renal insufficiency 3. Due to bladder management, bowel management, safety, skin/wound care, disease management, medication administration, pain management and patient education, does the patient require 24 hr/day rehab nursing? Yes 4. Does the patient require coordinated care of a physician, rehab nurse, PT (1-2 hrs/day, 5 days/week) and OT (1-2 hrs/day, 5 days/week) to address physical and functional deficits in the context of the above medical diagnosis(es)? Yes Addressing deficits in the following areas: balance, endurance, locomotion, strength, transferring, bowel/bladder control, bathing,  dressing, feeding, grooming and toileting 5. Can the patient actively participate in an intensive therapy program of at least 3 hrs of therapy per day at least 5 days per week? Yes 6. The potential for patient to make measurable gains while on inpatient rehab is excellent 7. Anticipated functional outcomes upon discharge from inpatient rehab are modified independent  with PT, modified independent with OT, n/a with SLP. 8. Estimated rehab length of stay to reach the above functional goals is: 7 days 9. Does the patient have adequate social supports to accommodate these discharge functional goals? Yes 10. Anticipated D/C setting: Home 11. Anticipated post D/C treatments: [facility] 12. Overall Rehab/Functional Prognosis: excellent  RECOMMENDATIONS: This patient's condition is appropriate for continued rehabilitative care in the following setting: CIR Patient has agreed to participate in recommended program. Yes Note that insurance prior authorization may be required for reimbursement for recommended care.  Comment: Family very supportive    12/03/2013

## 2013-12-03 NOTE — Progress Notes (Signed)
Occupational Therapy Evaluation Patient Details Name: Alexander Cox MRN: [id number] DOB: 1945/08/12 Today's Date: 12/03/2013    History of Present Illness 68 year old male, up on a ladder and apparently had a syncopal spell and fell. He suffered a T10-T11 burst fx. L rib fxs.He was dehydrated on admission with hypotension. Spinal fractures to be managed conservatively with TLSO. L rib fxs.    Clinical Impression   PTA, pt independent with ADL and mobility. Pt states that he has less pain this pm as compared to this am during PT session. Pt was able to mobilize with +2 mod A @ 10 feet to w/c @ RW level. C/o pain R lateral aspect of calf and numbness bottom of R foot. Began education on back precautions with ADL and mobility. Handout given. Rec CIR at this time to return to PLOF. Pt will benefit from skilled OT services to facilitate D/C to next venue due to below deficits.    Follow Up Recommendations  CIR;Supervision/Assistance - 24 hour    Equipment Recommendations  3 in 1 bedside comode;Other (comment) (RW)    Recommendations for Other Services Rehab consult     Precautions / Restrictions Precautions Precautions: Back Precaution Booklet Issued: Yes (comment) Precaution Comments: educated on techniques for pain management and comfort with brace Required Braces or Orthoses: Spinal Brace Spinal Brace: Thoracolumbosacral orthotic;Applied in supine position Restrictions Weight Bearing Restrictions: No      Mobility Bed Mobility Overal bed mobility: Needs Assistance Bed Mobility: Rolling;Sidelying to Sit Rolling: Mod assist Sidelying to sit: Max assist       General bed mobility comments: significant increased pain with mobility, rolling side to side for donning of TLSO brace, assist to come to upright positioning, VCs for technique and sequencing  Transfers Overall transfer level: Needs assistance Equipment used: Rolling walker (2 wheeled)   Sit to Stand: Mod assist;+2  physical assistance         General transfer comment: Increased assist, VCs for hand placement and safety, patient with significant pain during transitional movement, assist to elevate to standing and to provide support in standingf    Balance     Sitting balance-Leahy Scale: Good       Standing balance-Leahy Scale: Fair                              ADL Overall ADL's : Needs assistance/impaired     Grooming: Minimal assistance;Sitting Grooming Details (indicate cue type and reason): needs to be supported. Unable to sit EOB without support of BUE Upper Body Bathing: Minimal assitance;Sitting   Lower Body Bathing: Maximal assistance;Sit to/from stand   Upper Body Dressing : Moderate assistance;Sitting   Lower Body Dressing: Maximal assistance;Sit to/from stand   Toilet Transfer: +2 for physical assistance;Moderate assistance   Toileting- Clothing Manipulation and Hygiene: Maximal assistance;Sit to/from stand (to follow back precautions with TLSO)       Functional mobility during ADLs: +2 for physical assistance;Moderate assistance General ADL Comments: Began education on back precautions and ADL. TLSO donned in supine     Vision                     Perception     Praxis      Pertinent Vitals/Pain 8/10 back. premedicated     Hand Dominance Right   Extremity/Trunk Assessment Upper Extremity Assessment Upper Extremity Assessment: Overall WFL for tasks assessed   Lower Extremity Assessment Lower Extremity Assessment: Defer  to PT evaluation (c/o RLE pain lateral aspect of calf/numbness bottom of foot) RLE: Unable to fully assess due to pain   Cervical / Trunk Assessment Cervical / Trunk Assessment: Other exceptions (multiple fxs)   Communication Communication Communication: No difficulties (left ear)   Cognition Arousal/Alertness: Awake/alert Behavior During Therapy: WFL for tasks assessed/performed Overall Cognitive Status: Within  Functional Limits for tasks assessed                     General Comments       Exercises       Shoulder Instructions      Home Living Family/patient expects to be discharged to:: Private residence Living Arrangements: Spouse/significant other Available Help at Discharge: Family Type of Home: House Home Access: Stairs to enter Technical brewer of Steps: 5 Entrance Stairs-Rails: None Home Layout: One level     Bathroom Shower/Tub: Occupational psychologist: Standard Bathroom Accessibility: Yes How Accessible: Accessible via walker Home Equipment: [place] - 4 wheels;Other (comment) (was his mother's)          Prior Functioning/Environment Level of Independence: Independent             OT Diagnosis: Generalized weakness;Acute pain   OT Problem List: Decreased strength;Decreased range of motion;Decreased activity tolerance;Impaired balance (sitting and/or standing);Decreased knowledge of use of DME or AE;Decreased knowledge of precautions;Impaired sensation;Pain   OT Treatment/Interventions: Self-care/ADL training;DME and/or AE instruction;Therapeutic activities;Patient/family education;Balance training    OT Goals(Current goals can be found in the care plan section) Acute Rehab OT Goals Patient Stated Goal: to be independent OT Goal Formulation: With patient Time For Goal Achievement: 12/17/13 Potential to Achieve Goals: Good  OT Frequency: Min 2X/week   Barriers to D/C:            Co-evaluation              End of Session Equipment Utilized During Treatment: Gait belt;Rolling walker;Back brace Nurse Communication: Mobility status;Precautions  Activity Tolerance: Patient tolerated treatment well Patient left: Other (comment) (w/c with nsg)   Time: 3559-7416 OT Time Calculation (min): 26 min Charges:  OT General Charges $OT Visit: 1 Procedure OT Evaluation $Initial OT Evaluation Tier I: 1 Procedure OT Treatments $Self  Care/Home Management : 8-22 mins G-Codes:    [name],[name] December 05, 2013, 3:32 PM   [facility], OTR/L  [phone] December 05, 2013

## 2013-12-03 NOTE — Evaluation (Signed)
Physical Therapy Evaluation Patient Details Name: Alexander Cox MRN: [id number] DOB: July 28, 1945 Today's Date: 12/03/2013   History of Present Illness  68 year old male, up on a ladder and apparently had a syncopal spell and fell. He suffered a T11 fx. He was dehydrated on admission with hypotension. Spinal fractures to be managed conservatively with TLSO. Orders recieved for PT.  Clinical Impression  Patient demonstrates deficits in functional mobility as indicated below. Will benefit from continued skilled PT to address deficits and maximize function. Will see as indicated and progress as tolerated. Patient will need CIR upon acute discharge.    Follow Up Recommendations CIR    Equipment Recommendations   (TBD)    Recommendations for Other Services Rehab consult     Precautions / Restrictions Precautions Precautions: Back Precaution Comments: educated on techniques for pain management and comfort with brace Required Braces or Orthoses: Spinal Brace Spinal Brace: Thoracolumbosacral orthotic Restrictions Weight Bearing Restrictions: No      Mobility  Bed Mobility Overal bed mobility: Needs Assistance Bed Mobility: Rolling;Sidelying to Sit Rolling: Mod assist Sidelying to sit: Max assist       General bed mobility comments: significant increased pain with mobility, rolling side to side for donning of TLSO brace, assist to come to upright positioning, VCs for technique and sequencing  Transfers Overall transfer level: Needs assistance Equipment used: Rolling walker (2 wheeled) Transfers: Sit to/from Stand Sit to Stand: Mod assist;+2 physical assistance         General transfer comment: Increased assist, VCs for hand placement and safety, patient with significant pain during transitional movement, assist to elevate to standing and to provide support in standingf  Ambulation/Gait Ambulation/Gait assistance: Min assist Ambulation Distance (Feet): 12 Feet Assistive  device: Rolling walker (2 wheeled) Gait Pattern/deviations: Step-to pattern;Decreased stride length;Decreased weight shift to right;Shuffle;Antalgic;Trunk flexed;Narrow base of support Gait velocity: decreased Gait velocity interpretation: <1.8 ft/sec, indicative of risk for recurrent falls General Gait Details: Patient limited by pain, difficulty coming down through RLE secondary to increased hip pain  Stairs            Wheelchair Mobility    Modified Rankin (Stroke Patients Only)       Balance Overall balance assessment: Needs assistance Sitting-balance support: Feet supported Sitting balance-Leahy Scale: Good     Standing balance support: Bilateral upper extremity supported;During functional activity Standing balance-Leahy Scale: Fair                               Pertinent Vitals/Pain Significant pain with mobility, greater in right hip, nsg to medicate    Home Living Family/patient expects to be discharged to:: Private residence Living Arrangements: Spouse/significant other Available Help at Discharge: Family Type of Home: House Home Access: Stairs to enter Entrance Stairs-Rails: None Technical brewer of Steps: 5 Home Layout: One level   Additional Comments: tub shower with curtain, standard height toliets    Prior Function Level of Independence: Independent               Hand Dominance   Dominant Hand: Right    Extremity/Trunk Assessment   Upper Extremity Assessment: Overall WFL for tasks assessed           Lower Extremity Assessment: RLE deficits/detail      Cervical / Trunk Assessment:  (multiple fxs)  Communication   Communication: HOH (left ear)  Cognition Arousal/Alertness: Awake/alert Behavior During Therapy: WFL for tasks assessed/performed Overall Cognitive Status: Within Functional  Limits for tasks assessed                      General Comments General comments (skin integrity, edema, etc.):  educated patient on importance of deep breathing, discussed expectations for mobility, positional changes and technique for bed mobility for comfort. Educated patient and spouse on brace, how to don brace as well as when to wear brace.    Exercises        Assessment/Plan    PT Assessment Patient needs continued PT services  PT Diagnosis Difficulty walking;Abnormality of gait;Generalized weakness;Acute pain   PT Problem List Decreased strength;Decreased range of motion;Decreased activity tolerance;Decreased balance;Decreased mobility;Decreased coordination;Decreased knowledge of use of DME;Pain  PT Treatment Interventions DME instruction;Gait training;Stair training;Functional mobility training;Therapeutic activities;Therapeutic exercise;Balance training;Patient/family education   PT Goals (Current goals can be found in the Care Plan section) Acute Rehab PT Goals Patient Stated Goal: to be independent PT Goal Formulation: With patient/family Time For Goal Achievement: 12/17/13 Potential to Achieve Goals: Good    Frequency Min 4X/week   Barriers to discharge        Co-evaluation               End of Session Equipment Utilized During Treatment: Gait belt;Back brace Activity Tolerance: Patient tolerated treatment well;Patient limited by pain Patient left: in chair;with call bell/phone within reach;with family/visitor present Nurse Communication: Mobility status         Time: 1884-1660 PT Time Calculation (min): 25 min   Charges:   PT Evaluation $Initial PT Evaluation Tier I: 1 Procedure PT Treatments $Gait Training: 8-22 mins $Self Care/Home Management: 8-22   PT G CodesDuncan Dull 12/03/2013, 11:54 AM Alben Deeds, PT DPT  [phone]

## 2013-12-03 NOTE — Progress Notes (Signed)
Patient with right lower extremity radicular pain with attempts at mobilization yesterday. Patient has no pain when lying flat in bed but does have pain with sitting upright or straight leg raising on the right side. Pain pattern most consistent with a right-sided L5 radicular pattern. Patient has no left-sided symptoms. The patient's pain is isolated to a dermatomal distribution in his right lower extremity is not appear to be secondary to any type of spinal cord compression.  Vitals are stable. Afebrile. Awake and alert. Motor and sensory function stable. Lumbar spine mildly tender. Thoracic spine seems to be doing better.  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 I believe he has some secondary L5 nerve root irritation which may be postconcussive or due to ongoing compression. At this point I would like to try to slowly mobilize him. If unsuccessful over the next few days we may try steroids if that doesn't work he may eventually require decompression and fusion surgery at L5-S1. With regard to his thoracic spine fracture, I believe this should heal with bracing alone. Plan to get upright x-rays today if patient is able to tolerate being upright in his brace.

## 2013-12-03 NOTE — Progress Notes (Signed)
UR completed.  Cowen Pesqueira, RN BSN M[facility] CCM Trauma/Neuro ICU Case Manager [phone]  

## 2013-12-04 ENCOUNTER — Inpatient Hospital Stay (HOSPITAL_COMMUNITY)
Admission: RE | Admit: 2013-12-04 | Discharge: 2013-12-14 | DRG: 945 | Disposition: A | Discharge status: Home Health | Payer: Medicare Other | Source: Intra-hospital | Attending: Physical Medicine & Rehabilitation | Admitting: Physical Medicine & Rehabilitation

## 2013-12-04 ENCOUNTER — Encounter (HOSPITAL_COMMUNITY): Payer: Self-pay | Admitting: Physical Medicine and Rehabilitation

## 2013-12-04 ENCOUNTER — Encounter (HOSPITAL_COMMUNITY): Payer: Self-pay | Admitting: Emergency Medicine

## 2013-12-04 DIAGNOSIS — S2249XA Multiple fractures of ribs, unspecified side, initial encounter for closed fracture: Secondary | ICD-10-CM

## 2013-12-04 DIAGNOSIS — R5381 Other malaise: Secondary | ICD-10-CM | POA: Diagnosis present

## 2013-12-04 DIAGNOSIS — N1831 Chronic kidney disease, stage 3a: Secondary | ICD-10-CM | POA: Diagnosis present

## 2013-12-04 DIAGNOSIS — Z7982 Long term (current) use of aspirin: Secondary | ICD-10-CM

## 2013-12-04 DIAGNOSIS — S22009A Unspecified fracture of unspecified thoracic vertebra, initial encounter for closed fracture: Secondary | ICD-10-CM | POA: Diagnosis present

## 2013-12-04 DIAGNOSIS — E7800 Pure hypercholesterolemia, unspecified: Secondary | ICD-10-CM | POA: Diagnosis present

## 2013-12-04 DIAGNOSIS — IMO0002 Reserved for concepts with insufficient information to code with codable children: Secondary | ICD-10-CM | POA: Diagnosis present

## 2013-12-04 DIAGNOSIS — Z5189 Encounter for other specified aftercare: Secondary | ICD-10-CM

## 2013-12-04 DIAGNOSIS — I10 Essential (primary) hypertension: Secondary | ICD-10-CM | POA: Diagnosis present

## 2013-12-04 DIAGNOSIS — R5383 Other fatigue: Secondary | ICD-10-CM | POA: Diagnosis not present

## 2013-12-04 DIAGNOSIS — Q762 Congenital spondylolisthesis: Secondary | ICD-10-CM

## 2013-12-04 DIAGNOSIS — N189 Chronic kidney disease, unspecified: Secondary | ICD-10-CM | POA: Diagnosis present

## 2013-12-04 DIAGNOSIS — E860 Dehydration: Secondary | ICD-10-CM | POA: Diagnosis present

## 2013-12-04 DIAGNOSIS — D62 Acute posthemorrhagic anemia: Secondary | ICD-10-CM | POA: Diagnosis present

## 2013-12-04 DIAGNOSIS — M62838 Other muscle spasm: Secondary | ICD-10-CM | POA: Diagnosis present

## 2013-12-04 DIAGNOSIS — Z79899 Other long term (current) drug therapy: Secondary | ICD-10-CM | POA: Diagnosis not present

## 2013-12-04 DIAGNOSIS — W11XXXA Fall on and from ladder, initial encounter: Secondary | ICD-10-CM | POA: Diagnosis present

## 2013-12-04 DIAGNOSIS — Z87891 Personal history of nicotine dependence: Secondary | ICD-10-CM | POA: Diagnosis not present

## 2013-12-04 DIAGNOSIS — K900 Celiac disease: Secondary | ICD-10-CM | POA: Diagnosis present

## 2013-12-04 DIAGNOSIS — N289 Disorder of kidney and ureter, unspecified: Secondary | ICD-10-CM

## 2013-12-04 DIAGNOSIS — I129 Hypertensive chronic kidney disease with stage 1 through stage 4 chronic kidney disease, or unspecified chronic kidney disease: Secondary | ICD-10-CM | POA: Diagnosis present

## 2013-12-04 DIAGNOSIS — I959 Hypotension, unspecified: Secondary | ICD-10-CM

## 2013-12-04 DIAGNOSIS — W19XXXA Unspecified fall, initial encounter: Secondary | ICD-10-CM

## 2013-12-04 HISTORY — DX: Unspecified fracture of unspecified thoracic vertebra, initial encounter for closed fracture: S22.009A

## 2013-12-04 LAB — BASIC METABOLIC PANEL
ANION GAP: 10 (ref 5–15)
BUN: 14 mg/dL (ref 6–23)
CALCIUM: 8.9 mg/dL (ref 8.4–10.5)
CO2: 29 mEq/L (ref 19–32)
Chloride: 100 mEq/L (ref 96–112)
Creatinine, Ser: 1.17 mg/dL (ref 0.50–1.35)
GFR calc non Af Amer: 63 mL/min — ABNORMAL LOW (ref >90)
GFR, EST AFRICAN AMERICAN: 73 mL/min — AB (ref >90)
Glucose, Bld: 103 mg/dL — ABNORMAL HIGH (ref 70–99)
POTASSIUM: 4.3 meq/L (ref 3.7–5.3)
Sodium: 139 mEq/L (ref 137–147)

## 2013-12-04 MED ORDER — PROCHLORPERAZINE 25 MG RE SUPP
12.5000 mg | Freq: Four times a day (QID) | RECTAL | Status: DC | PRN
  Filled 2013-12-04: qty 1

## 2013-12-04 MED ORDER — BISACODYL 10 MG RE SUPP: 10.0000 mg | Freq: Every day | RECTAL | Status: DC | PRN

## 2013-12-04 MED ORDER — SIMVASTATIN 20 MG PO TABS
20.0000 mg | ORAL_TABLET | Freq: Every day | ORAL | Status: DC
  Administered 2013-12-05 – 2013-12-13 (×9): 20 mg via ORAL
  Filled 2013-12-04 (×10): qty 1

## 2013-12-04 MED ORDER — ACETAMINOPHEN 325 MG PO TABS
325.0000 mg | ORAL_TABLET | ORAL | Status: DC | PRN
  Administered 2013-12-11 (×2): 650 mg via ORAL
  Filled 2013-12-04 (×2): qty 2

## 2013-12-04 MED ORDER — FLEET ENEMA 7-19 GM/118ML RE ENEM
1.0000 | ENEMA | Freq: Once | RECTAL | Status: AC
  Administered 2013-12-04: 1 via RECTAL
  Filled 2013-12-04: qty 1

## 2013-12-04 MED ORDER — DIPHENHYDRAMINE HCL 12.5 MG/5ML PO ELIX: 12.5000 mg | ORAL_SOLUTION | Freq: Four times a day (QID) | ORAL | Status: DC | PRN

## 2013-12-04 MED ORDER — ENOXAPARIN SODIUM 40 MG/0.4ML ~~LOC~~ SOLN
40.0000 mg | SUBCUTANEOUS | Status: DC
  Administered 2013-12-04 – 2013-12-10 (×7): 40 mg via SUBCUTANEOUS
  Filled 2013-12-04 (×8): qty 0.4

## 2013-12-04 MED ORDER — CYCLOBENZAPRINE HCL 5 MG PO TABS: 5.0000 mg | ORAL_TABLET | Freq: Three times a day (TID) | ORAL | Status: DC | PRN

## 2013-12-04 MED ORDER — FLEET ENEMA 7-19 GM/118ML RE ENEM: 1.0000 | ENEMA | Freq: Once | RECTAL | Status: AC | PRN

## 2013-12-04 MED ORDER — OXYCODONE HCL ER 10 MG PO T12A
10.0000 mg | EXTENDED_RELEASE_TABLET | Freq: Two times a day (BID) | ORAL | Status: DC
  Administered 2013-12-04 – 2013-12-08 (×8): 10 mg via ORAL
  Filled 2013-12-04 (×8): qty 1

## 2013-12-04 MED ORDER — GUAIFENESIN-DM 100-10 MG/5ML PO SYRP: 5.0000 mL | ORAL_SOLUTION | Freq: Four times a day (QID) | ORAL | Status: DC | PRN

## 2013-12-04 MED ORDER — ASPIRIN EC 81 MG PO TBEC
81.0000 mg | DELAYED_RELEASE_TABLET | Freq: Every day | ORAL | Status: DC
  Administered 2013-12-05 – 2013-12-14 (×9): 81 mg via ORAL
  Filled 2013-12-04 (×11): qty 1

## 2013-12-04 MED ORDER — ALUM & MAG HYDROXIDE-SIMETH 200-200-20 MG/5ML PO SUSP
30.0000 mL | ORAL | Status: DC | PRN
  Administered 2013-12-08: 30 mL via ORAL
  Filled 2013-12-04: qty 30

## 2013-12-04 MED ORDER — CYCLOBENZAPRINE HCL 5 MG PO TABS
5.0000 mg | ORAL_TABLET | Freq: Three times a day (TID) | ORAL | Status: DC
  Administered 2013-12-04 – 2013-12-10 (×17): 5 mg via ORAL
  Filled 2013-12-04 (×20): qty 1

## 2013-12-04 MED ORDER — TRAZODONE HCL 50 MG PO TABS
25.0000 mg | ORAL_TABLET | Freq: Every evening | ORAL | Status: DC | PRN
  Administered 2013-12-05 – 2013-12-09 (×2): 50 mg via ORAL
  Filled 2013-12-04 (×2): qty 1

## 2013-12-04 MED ORDER — PANTOPRAZOLE SODIUM 40 MG PO TBEC
40.0000 mg | DELAYED_RELEASE_TABLET | Freq: Every day | ORAL | Status: DC
  Administered 2013-12-05 – 2013-12-14 (×9): 40 mg via ORAL
  Filled 2013-12-04 (×9): qty 1

## 2013-12-04 MED ORDER — POLYVINYL ALCOHOL 1.4 % OP SOLN
1.0000 [drp] | OPHTHALMIC | Status: DC | PRN
Start: 2013-12-04 — End: 2013-12-14
  Filled 2013-12-04: qty 15

## 2013-12-04 MED ORDER — PROCHLORPERAZINE MALEATE 5 MG PO TABS
5.0000 mg | ORAL_TABLET | Freq: Four times a day (QID) | ORAL | Status: DC | PRN
  Filled 2013-12-04: qty 2

## 2013-12-04 MED ORDER — ONDANSETRON HCL 4 MG/2ML IJ SOLN: 4.0000 mg | Freq: Four times a day (QID) | INTRAMUSCULAR | Status: DC | PRN

## 2013-12-04 MED ORDER — CALCIUM CARBONATE 1250 (500 CA) MG PO TABS
1.0000 | ORAL_TABLET | Freq: Every day | ORAL | Status: DC
  Administered 2013-12-05 – 2013-12-14 (×9): 500 mg via ORAL
  Filled 2013-12-04 (×11): qty 1

## 2013-12-04 MED ORDER — VITAMIN D3 25 MCG (1000 UNIT) PO TABS
1000.0000 [IU] | ORAL_TABLET | Freq: Every day | ORAL | Status: DC
  Administered 2013-12-05 – 2013-12-14 (×9): 1000 [IU] via ORAL
  Filled 2013-12-04 (×11): qty 1

## 2013-12-04 MED ORDER — POLYETHYLENE GLYCOL 3350 17 G PO PACK
17.0000 g | PACK | Freq: Two times a day (BID) | ORAL | Status: DC
  Administered 2013-12-04 – 2013-12-11 (×7): 17 g via ORAL
  Filled 2013-12-04 (×22): qty 1

## 2013-12-04 MED ORDER — TRAMADOL HCL 50 MG PO TABS
50.0000 mg | ORAL_TABLET | Freq: Four times a day (QID) | ORAL | Status: DC | PRN
  Administered 2013-12-07 – 2013-12-14 (×11): 50 mg via ORAL
  Filled 2013-12-04 (×12): qty 1

## 2013-12-04 MED ORDER — OXYCODONE HCL 5 MG PO TABS
10.0000 mg | ORAL_TABLET | ORAL | Status: DC | PRN
  Administered 2013-12-04: 15 mg via ORAL
  Administered 2013-12-05: 10 mg via ORAL
  Administered 2013-12-05 – 2013-12-10 (×22): 15 mg via ORAL
  Administered 2013-12-10: 10 mg via ORAL
  Filled 2013-12-04 (×11): qty 3
  Filled 2013-12-04: qty 2
  Filled 2013-12-04 (×5): qty 3
  Filled 2013-12-04: qty 2
  Filled 2013-12-04 (×8): qty 3

## 2013-12-04 MED ORDER — PROCHLORPERAZINE EDISYLATE 5 MG/ML IJ SOLN
5.0000 mg | Freq: Four times a day (QID) | INTRAMUSCULAR | Status: DC | PRN
  Filled 2013-12-04: qty 2

## 2013-12-04 MED ORDER — ONDANSETRON HCL 4 MG PO TABS: 4.0000 mg | ORAL_TABLET | Freq: Four times a day (QID) | ORAL | Status: DC | PRN

## 2013-12-04 NOTE — Interval H&P Note (Signed)
Alexander Cox was admitted today to Inpatient Rehabilitation with the diagnosis of T11 burst fx with L5-S1 spondylolisthesis and right L5 radiculopathy after fall.  The patient's history has been reviewed, patient examined, and there is no change in status.  Patient continues to be appropriate for intensive inpatient rehabilitation.  I have reviewed the patient's chart and labs.  Questions were answered to the patient's satisfaction.  [name] T 12/04/2013, 8:29 PM

## 2013-12-04 NOTE — Progress Notes (Signed)
I met with pt and his wife at bedside. We discussed an inpt rehab admission for his recovery. I await Dr. Marchelle Folks assessment of any further surgical options before determining medical readiness for admission to inpt rehab. I have left a message for Dr. Annette Stable to contact me to discuss. I will begin insurance approval for possible admission today or tomorrow if no further surgical interventions needed. I have discussed with Trauma PA and will follow up today. RN is aware. [phone]

## 2013-12-04 NOTE — H&P (View-Only) (Signed)
  Physical Medicine and Rehabilitation Admission H&P    Chief Complaint  Patient presents with  . T 11 burst fracture, paraspinal muscle strain, L5 radiculopathy    HPI: Alexander Cox is a 68 y.o. male with history of celiac disease, HTN with recent medication changes with episodic of dizziness and near syncope. On 11/30/13, he was up 20 feet on a ladder when he developed recurrent dizziness but continued working. He had LOC with fall and onset of lower thoracic pain. In ED he was found to be hypotensive with SBP in 80's and MRI of spine revealed unstable T-11 burst fracture, non displaced T2 fracture as well as non-displaced fracture of T- 10 posterior elements, L5/S1 anterolisthesis with ligamentous injury and left greater than right low grade paraspinal muscle strain. He was noted to be dehydrated and hypotension improved with IVF. Dr. Pool consulted and recommended immobilization with TLSO as patient without neurologic symptoms.  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 Cardiology recommended holding BP medications and 2D echo done revealing EF 50-55% with grade 1 diastolic dysfunction. Follow up upright thoracic films done today showing stable fracture. Therapy ongoing and patient with decrease in RLE pain. CIR recommended by rehab team and MD and patient admitted today.    Review of Systems  HENT: Negative for hearing loss.   Eyes: Negative for blurred vision and double vision.  Respiratory: Negative for cough and shortness of breath.   Cardiovascular: Negative for chest pain and palpitations.  Gastrointestinal: Positive for constipation. Negative for heartburn, nausea and vomiting.  Genitourinary: Negative for urgency and frequency.  Musculoskeletal: Positive for back pain and myalgias.  Neurological:  Positive for dizziness (with activity). Negative for tingling, sensory change, focal weakness and headaches.  Psychiatric/Behavioral: Negative for depression and memory loss. The patient has insomnia.     Past Medical History  Diagnosis Date  . Hypertension   . High cholesterol   . Celiac disease   . Nephrolithiasis     right   History reviewed. No pertinent past surgical history. Family History  Problem Relation Age of Onset  . Hypertension Paternal Grandfather     Social History: Married. Independent PTA. 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 He reports that he has quit smoking. He uses smokeless tobacco--chews 3-4/day. He reports that he does not drink alcohol or use illicit drugs.    Allergies  Allergen Reactions  . Gluten Meal     Medications Prior to Admission  Medication Sig Dispense Refill  . aspirin EC 81 MG tablet Take 81 mg by mouth daily.      . calcium carbonate (OS-CAL) 600 MG TABS tablet Take 600 mg by mouth daily.      . Cholecalciferol (VITAMIN D PO) Take 1,000 Units by mouth daily.      . hydrochlorothiazide (HYDRODIURIL) 12.5 MG tablet Take 12.5 mg by mouth daily.      . lisinopril (PRINIVIL,ZESTRIL) 20 MG tablet Take 10 mg by mouth 2 (two) times daily.      . Multiple Vitamin (MULTIVITAMIN WITH MINERALS) TABS tablet Take 1 tablet by mouth daily.      . Omega-3 Fatty Acids (FISH OIL PO) Take 1 capsule by mouth daily.       . simvastatin (ZOCOR) 20 MG tablet Take 20 mg by mouth daily.        Home: Home Living   Family/patient expects to be discharged to:: Private residence Living Arrangements: Spouse/significant other Available Help at Discharge: Family Type of Home: House Home Access: Stairs to enter Technical brewer of Steps: 5 Entrance Stairs-Rails: None Home Layout: One level Home Equipment: Environmental consultant - 4 wheels;Other (comment) (was his mother's) Additional Comments: tub shower with curtain, standard height toliets   Functional  History: Prior Function Level of Independence: Independent  Functional Status:  Mobility: Bed Mobility Overal bed mobility: Needs Assistance Bed Mobility: Rolling;Sidelying to Sit Rolling: Min assist (with rail) Sidelying to sit: Mod assist General bed mobility comments: rolled Lt for donning brace with vc for technique to prevent twisting and minimize pain; wife present and observed donning brace with verbal instructions given; vc and physical assist for roll and side to sit Transfers Overall transfer level: Needs assistance Equipment used: Rolling walker (2 wheeled) Transfers: Sit to/from Stand Sit to Stand: Mod assist;From elevated surface (bed elevated to simulate home) General transfer comment: VCs for hand placement and safety, patient with significant pain during transitional movement, assist to elevate to standing and to provide support in standing Ambulation/Gait Ambulation/Gait assistance: Min assist;+2 safety/equipment Ambulation Distance (Feet): 25 Feet Assistive device: Rolling walker (2 wheeled) Gait Pattern/deviations: Step-to pattern;Decreased weight shift to right;Antalgic;Wide base of support Gait velocity: decreased Gait velocity interpretation: <1.8 ft/sec, indicative of risk for recurrent falls General Gait Details: Pt continues with primary complaint of pain in Rt hip area and denies pain down into RLE; pt externally rotates RLE with limited weight bearing due to pain; heavy reliance on UE support on RW    ADL: ADL Overall ADL's : Needs assistance/impaired Grooming: Minimal assistance;Sitting Grooming Details (indicate cue type and reason): needs to be supported. Unable to sit EOB without support of BUE Upper Body Bathing: Minimal assitance;Sitting Lower Body Bathing: Maximal assistance;Sit to/from stand Upper Body Dressing : Moderate assistance;Sitting Lower Body Dressing: Maximal assistance;Sit to/from stand Toilet Transfer: +2 for physical  assistance;Moderate assistance Toileting- Clothing Manipulation and Hygiene: Maximal assistance;Sit to/from stand (to follow back precautions with TLSO) Functional mobility during ADLs: +2 for physical assistance;Moderate assistance General ADL Comments: Began education on back precautions and ADL. TLSO donned in supine  Cognition: Cognition Overall Cognitive Status: Within Functional Limits for tasks assessed Orientation Level: Oriented X4 Cognition Arousal/Alertness: Awake/alert Behavior During Therapy: WFL for tasks assessed/performed Overall Cognitive Status: Within Functional Limits for tasks assessed  Physical Exam: Blood pressure 147/74, pulse 72, temperature 98.1 F (36.7 C), temperature source Oral, resp. rate 16, height 5' 7" (1.702 m), weight 70.9 kg (156 lb 4.9 oz), SpO2 100.00%. Physical Exam   Constitutional: He is oriented to person, place, and time. He appears well-developed and well-nourished.  HENT: oral mucosa pink and moist, dentition fair.  Head: Normocephalic and atraumatic.  Eyes: Conjunctivae are normal. Pupils are equal, round, and reactive to light.  Neck: Normal range of motion. Neck supple.  Cardiovascular: Normal rate and regular rhythm.  No murmur heard.  Respiratory: Effort normal and breath sounds normal. No respiratory distress. He has no wheezes.  GI: Soft. Bowel sounds are normal. He exhibits no distension. There is no tenderness.  Musculoskeletal: He exhibits no edema and no tenderness.  Pain right hip with SLR. Back pain with palpation over L5-S1 area and more superiorly as well. Neurological: He is alert and oriented to person, place, and time. CN normal. Normal cognition Motor strength is 5/5 bilateral deltoid, bicep, tricep, grip  3/5 in the right hip flexor knee extensor 4-/5 right ankle dorsiflexor plantar flexor  4/5 left hip flexor 4/5 knee extensor 5/5 ankle dorsiflexor plantar flexor  Sensation reduced to light touch right great  toe--intact sensation over right leg. DTR's 1+.  Skin: small abrasions over left elbow. Otherwise intact Psych: pleasant and appropriate.    Results for orders placed during the hospital encounter of 11/30/13 (from the past 48 hour(s))  BASIC METABOLIC PANEL     Status: Abnormal   Collection Time    12/04/13  2:20 AM      Result Value Ref Range   Sodium 139  137 - 147 mEq/L   Potassium 4.3  3.7 - 5.3 mEq/L   Chloride 100  96 - 112 mEq/L   CO2 29  19 - 32 mEq/L   Glucose, Bld 103 (*) 70 - 99 mg/dL   BUN 14  6 - 23 mg/dL   Creatinine, Ser 1.17  0.50 - 1.35 mg/dL   Calcium 8.9  8.4 - 10.5 mg/dL   GFR calc non Af Amer 63 (*) >90 mL/min   GFR calc Af Amer 73 (*) >90 mL/min   Comment: (NOTE)     The eGFR has been calculated using the CKD EPI equation.     This calculation has not been validated in all clinical situations.     eGFR's persistently <90 mL/min signify possible Chronic Kidney     Disease.   Anion gap 10  5 - 15   Dg Thoracic Spine 2 View  12/03/2013   CLINICAL DATA:  Thoracic spine fracture or  EXAM: THORACIC SPINE - 2 VIEW  COMPARISON:  CT thoracic spine 11/30/2013  FINDINGS: Again noted is a burst fracture at T11 which was better characterized on the recent CT of the thoracic spine dated 11/30/2013. No significant interval change. Partially visualized is a nondisplaced fracture of the posterior elements of T10. The alignment is anatomic. There is no static listhesis. The disc spaces are maintained.  The visualized portions of the lungs are clear.  IMPRESSION: 1. Stable T11 burst fracture. 2. Stable nondisplaced T10 posterior element fracture.   Electronically Signed   By: Hetal  Patel   On: 12/03/2013 15:41   Dg Lumbar Spine 2-3 Views  12/03/2013   CLINICAL DATA:  T11 fracture.  Standing in brace lumbar spine  EXAM: LUMBAR SPINE - 2-3 VIEW  COMPARISON:  11/30/2013 lumbar spine CT  FINDINGS: The nondisplaced left transverse process fracture at L1 is essentially radiographically  occult, but was seen at recent CT scan.  Right nephrolithiasis. 3 mm of grade 1 anterolisthesis at L4-5 with facet arthropathy especially on the left at L4-5 and L5-S1. There is 4 mm anterolisthesis at L5-S1.  The T11 level is not included on today's lumbar spine exam.  IMPRESSION: 1. Stable appearance of the lumbar spine, with left facet arthropathy at L4-5 and L5-S1, and grade 1 anterolisthesis at both of these levels as well. 2. The known left L1 transverse process fracture is not radiographically visible. 3. Right nephrolithiasis.   Electronically Signed   By: Walt  Liebkemann M.D.   On: 12/03/2013 15:41       Medical Problem List and Plan: 1. Functional deficits secondary to Multitrauma with T11 burst fracture as well as spondylolisthesis with right L5 radiculopathy after a fall 11/30/2013  -Apply TLSO in supine 2.  DVT Prophylaxis/Anticoagulation: Pharmaceutical: Lovenox 3. Pain Management: Will add oxycontin for more consistent pain management with flexeril for muscle spasms. Continue prn oxycodone. 4. Mood: Seems to be upbeat. Will have LCSW follow for evaluation and   support.  5. Neuropsych: This patient is capable of making decisions on his own behalf. 6. Skin/Wound Care: No problems noted. Only mild abrasion to left elbow Continue to monitor for now.   7. ABLA: Due to trauma--hgb 13.9--->12.4. Will monitor for signs of bleeding. Recheck Monday.  8.  Acute on chronic renal insufficiency: Continue to encourage po fluid intake--BUN up to 31 indicating dehydration. Cr improved off HCTZ and Ace 2.02--->1.78.  9.  HTN: Monitor BP for recurrent orthostatic changes/dizziness. Will need to tolerate high BP to prevent symptoms.  10.  Constipation: Enema tonight. Will add Miralax bid.     Post Admission Physician Evaluation: 1. Functional deficits secondary  to T 11 burst fracture with spondylolisthesis with L5 radiculopathy and thoracic muscle strain.  2. Patient is admitted to receive  collaborative, interdisciplinary care between the physiatrist, rehab nursing staff, and therapy team. 3. Patient's level of medical complexity and substantial therapy needs in context of that medical necessity cannot be provided at a lesser intensity of care such as a SNF. 4. Patient has experienced substantial functional loss from his/her baseline which was documented above under the "Functional History" and "Functional Status" headings.  Judging by the patient's diagnosis, physical exam, and functional history, the patient has potential for functional progress which will result in measurable gains while on inpatient rehab.  These gains will be of substantial and practical use upon discharge  in facilitating mobility and self-care at the household level. 5. Physiatrist will provide 24 hour management of medical needs as well as oversight of the therapy plan/treatment and provide guidance as appropriate regarding the interaction of the two. 6. 24 hour rehab nursing will assist with bladder management, bowel management, safety, skin/wound care, disease management, medication administration, pain management and patient education  and help integrate therapy concepts, techniques,education, etc. 7. PT will assess and treat for/with: Lower extremity strength, range of motion, stamina, balance, functional mobility, safety, adaptive techniques and equipment, NMR, pain mgt, nerve densitization, TLSO manipulation, back precautions.   Goals are: mod I. 8. OT will assess and treat for/with: ADL's, functional mobility, safety, upper extremity strength, adaptive techniques and equipment, NMR, pain mgt, back precautions, don/doff of TLSO.   Goals are: mod I except TLSO don/doff which may be supervision to min assist. 9. SLP will assess and treat for/with: n/a.  Goals are: n/a. 10. Case Management and Social Worker will assess and treat for psychological issues and discharge planning. 11. Team conference will be held  weekly to assess progress toward goals and to determine barriers to discharge. 12. Patient will receive at least 3 hours of therapy per day at least 5 days per week. 13. ELOS: 8-12 days       14. Prognosis:  excellent     Meredith Staggers, MD, [facility]   12/04/2013

## 2013-12-04 NOTE — H&P (Signed)
Physical Medicine and Rehabilitation Admission H&P    Chief Complaint  Patient presents with  . T 11 burst fracture, paraspinal muscle strain, L5 radiculopathy    HPI: Alexander Cox is a 68 y.o. male with history of celiac disease, HTN with recent medication changes with episodic of dizziness and near syncope. On 11/30/13, he was up 20 feet on a ladder when he developed recurrent dizziness but continued working. He had LOC with fall and onset of lower thoracic pain. In ED he was found to be hypotensive with SBP in 80's and MRI of spine revealed unstable T-11 burst fracture, non displaced T2 fracture as well as non-displaced fracture of T- 10 posterior elements, L5/S1 anterolisthesis with ligamentous injury and left greater than right low grade paraspinal muscle strain. He was noted to be dehydrated and hypotension improved with IVF. Dr. Annette Stable consulted and recommended immobilization with TLSO as patient without neurologic symptoms.  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 Cardiology recommended holding BP medications and 2D echo done revealing EF 50-55% with grade 1 diastolic dysfunction. Follow up upright thoracic films done today showing stable fracture. Therapy ongoing and patient with decrease in RLE pain. CIR recommended by rehab team and MD and patient admitted today.    Review of Systems  HENT: Negative for hearing loss.   Eyes: Negative for blurred vision and double vision.  Respiratory: Negative for cough and shortness of breath.   Cardiovascular: Negative for chest pain and palpitations.  Gastrointestinal: Positive for constipation. Negative for heartburn, nausea and vomiting.  Genitourinary: Negative for urgency and frequency.  Musculoskeletal: Positive for back pain and myalgias.  Neurological:  Positive for dizziness (with activity). Negative for tingling, sensory change, focal weakness and headaches.  Psychiatric/Behavioral: Negative for depression and memory loss. The patient has insomnia.     Past Medical History  Diagnosis Date  . Hypertension   . High cholesterol   . Celiac disease   . Nephrolithiasis     right   History reviewed. No pertinent past surgical history. Family History  Problem Relation Age of Onset  . Hypertension Paternal Grandfather     Social History: Married. Independent PTA. 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 He reports that he has quit smoking. He uses smokeless tobacco--chews 3-4/day. He reports that he does not drink alcohol or use illicit drugs.    Allergies  Allergen Reactions  . Gluten Meal     Medications Prior to Admission  Medication Sig Dispense Refill  . aspirin EC 81 MG tablet Take 81 mg by mouth daily.      . calcium carbonate (OS-CAL) 600 MG TABS tablet Take 600 mg by mouth daily.      . Cholecalciferol (VITAMIN D PO) Take 1,000 Units by mouth daily.      . hydrochlorothiazide (HYDRODIURIL) 12.5 MG tablet Take 12.5 mg by mouth daily.      Marland Kitchen lisinopril (PRINIVIL,ZESTRIL) 20 MG tablet Take 10 mg by mouth 2 (two) times daily.      . Multiple Vitamin (MULTIVITAMIN WITH MINERALS) TABS tablet Take 1 tablet by mouth daily.      . Omega-3 Fatty Acids (FISH OIL PO) Take 1 capsule by mouth daily.       . simvastatin (ZOCOR) 20 MG tablet Take 20 mg by mouth daily.        Home: Home Living  Family/patient expects to be discharged to:: Private residence Living Arrangements: Spouse/significant other Available Help at Discharge: Family Type of Home: House Home Access: Stairs to enter Technical brewer of Steps: 5 Entrance Stairs-Rails: None Home Layout: One level Home Equipment: Environmental consultant - 4 wheels;Other (comment) (was his mother's) Additional Comments: tub shower with curtain, standard height toliets   Functional  History: Prior Function Level of Independence: Independent  Functional Status:  Mobility: Bed Mobility Overal bed mobility: Needs Assistance Bed Mobility: Rolling;Sidelying to Sit Rolling: Min assist (with rail) Sidelying to sit: Mod assist General bed mobility comments: rolled Lt for donning brace with vc for technique to prevent twisting and minimize pain; wife present and observed donning brace with verbal instructions given; vc and physical assist for roll and side to sit Transfers Overall transfer level: Needs assistance Equipment used: Rolling walker (2 wheeled) Transfers: Sit to/from Stand Sit to Stand: Mod assist;From elevated surface (bed elevated to simulate home) General transfer comment: VCs for hand placement and safety, patient with significant pain during transitional movement, assist to elevate to standing and to provide support in standing Ambulation/Gait Ambulation/Gait assistance: Min assist;+2 safety/equipment Ambulation Distance (Feet): 25 Feet Assistive device: Rolling walker (2 wheeled) Gait Pattern/deviations: Step-to pattern;Decreased weight shift to right;Antalgic;Wide base of support Gait velocity: decreased Gait velocity interpretation: <1.8 ft/sec, indicative of risk for recurrent falls General Gait Details: Pt continues with primary complaint of pain in Rt hip area and denies pain down into RLE; pt externally rotates RLE with limited weight bearing due to pain; heavy reliance on UE support on RW    ADL: ADL Overall ADL's : Needs assistance/impaired Grooming: Minimal assistance;Sitting Grooming Details (indicate cue type and reason): needs to be supported. Unable to sit EOB without support of BUE Upper Body Bathing: Minimal assitance;Sitting Lower Body Bathing: Maximal assistance;Sit to/from stand Upper Body Dressing : Moderate assistance;Sitting Lower Body Dressing: Maximal assistance;Sit to/from stand Toilet Transfer: +2 for physical  assistance;Moderate assistance Toileting- Clothing Manipulation and Hygiene: Maximal assistance;Sit to/from stand (to follow back precautions with TLSO) Functional mobility during ADLs: +2 for physical assistance;Moderate assistance General ADL Comments: Began education on back precautions and ADL. TLSO donned in supine  Cognition: Cognition Overall Cognitive Status: Within Functional Limits for tasks assessed Orientation Level: Oriented X4 Cognition Arousal/Alertness: Awake/alert Behavior During Therapy: WFL for tasks assessed/performed Overall Cognitive Status: Within Functional Limits for tasks assessed  Physical Exam: Blood pressure 147/74, pulse 72, temperature 98.1 F (36.7 C), temperature source Oral, resp. rate 16, height 5' 7" (1.702 m), weight 70.9 kg (156 lb 4.9 oz), SpO2 100.00%. Physical Exam   Constitutional: He is oriented to person, place, and time. He appears well-developed and well-nourished.  HENT: oral mucosa pink and moist, dentition fair.  Head: Normocephalic and atraumatic.  Eyes: Conjunctivae are normal. Pupils are equal, round, and reactive to light.  Neck: Normal range of motion. Neck supple.  Cardiovascular: Normal rate and regular rhythm.  No murmur heard.  Respiratory: Effort normal and breath sounds normal. No respiratory distress. He has no wheezes.  GI: Soft. Bowel sounds are normal. He exhibits no distension. There is no tenderness.  Musculoskeletal: He exhibits no edema and no tenderness.  Pain right hip with SLR. Back pain with palpation over L5-S1 area and more superiorly as well. Neurological: He is alert and oriented to person, place, and time. CN normal. Normal cognition Motor strength is 5/5 bilateral deltoid, bicep, tricep, grip  3/5 in the right hip flexor knee extensor 4-/5 right ankle dorsiflexor plantar flexor  4/5 left hip flexor 4/5 knee extensor 5/5 ankle dorsiflexor plantar flexor  Sensation reduced to light touch right great  toe--intact sensation over right leg. DTR's 1+.  Skin: small abrasions over left elbow. Otherwise intact Psych: pleasant and appropriate.    Results for orders placed during the hospital encounter of 11/30/13 (from the past 48 hour(s))  BASIC METABOLIC PANEL     Status: Abnormal   Collection Time    12/04/13  2:20 AM      Result Value Ref Range   Sodium 139  137 - 147 mEq/L   Potassium 4.3  3.7 - 5.3 mEq/L   Chloride 100  96 - 112 mEq/L   CO2 29  19 - 32 mEq/L   Glucose, Bld 103 (*) 70 - 99 mg/dL   BUN 14  6 - 23 mg/dL   Creatinine, Ser 1.17  0.50 - 1.35 mg/dL   Calcium 8.9  8.4 - 10.5 mg/dL   GFR calc non Af Amer 63 (*) >90 mL/min   GFR calc Af Amer 73 (*) >90 mL/min   Comment: (NOTE)     The eGFR has been calculated using the CKD EPI equation.     This calculation has not been validated in all clinical situations.     eGFR's persistently <90 mL/min signify possible Chronic Kidney     Disease.   Anion gap 10  5 - 15   Dg Thoracic Spine 2 View  12/03/2013   CLINICAL DATA:  Thoracic spine fracture or  EXAM: THORACIC SPINE - 2 VIEW  COMPARISON:  CT thoracic spine 11/30/2013  FINDINGS: Again noted is a burst fracture at T11 which was better characterized on the recent CT of the thoracic spine dated 11/30/2013. No significant interval change. Partially visualized is a nondisplaced fracture of the posterior elements of T10. The alignment is anatomic. There is no static listhesis. The disc spaces are maintained.  The visualized portions of the lungs are clear.  IMPRESSION: 1. Stable T11 burst fracture. 2. Stable nondisplaced T10 posterior element fracture.   Electronically Signed   By: Kathreen Devoid   On: 12/03/2013 15:41   Dg Lumbar Spine 2-3 Views  12/03/2013   CLINICAL DATA:  T11 fracture.  Standing in brace lumbar spine  EXAM: LUMBAR SPINE - 2-3 VIEW  COMPARISON:  11/30/2013 lumbar spine CT  FINDINGS: The nondisplaced left transverse process fracture at L1 is essentially radiographically  occult, but was seen at recent CT scan.  Right nephrolithiasis. 3 mm of grade 1 anterolisthesis at L4-5 with facet arthropathy especially on the left at L4-5 and L5-S1. There is 4 mm anterolisthesis at L5-S1.  The T11 level is not included on today's lumbar spine exam.  IMPRESSION: 1. Stable appearance of the lumbar spine, with left facet arthropathy at L4-5 and L5-S1, and grade 1 anterolisthesis at both of these levels as well. 2. The known left L1 transverse process fracture is not radiographically visible. 3. Right nephrolithiasis.   Electronically Signed   By: Sherryl Barters M.D.   On: 12/03/2013 15:41       Medical Problem List and Plan: 1. Functional deficits secondary to Multitrauma with T11 burst fracture as well as spondylolisthesis with right L5 radiculopathy after a fall 11/30/2013  -Apply TLSO in supine 2.  DVT Prophylaxis/Anticoagulation: Pharmaceutical: Lovenox 3. Pain Management: Will add oxycontin for more consistent pain management with flexeril for muscle spasms. Continue prn oxycodone. 4. Mood: Seems to be upbeat. Will have LCSW follow for evaluation and  support.  5. Neuropsych: This patient is capable of making decisions on his own behalf. 6. Skin/Wound Care: No problems noted. Only mild abrasion to left elbow Continue to monitor for now.   7. ABLA: Due to trauma--hgb 13.9--->12.4. Will monitor for signs of bleeding. Recheck Monday.  8.  Acute on chronic renal insufficiency: Continue to encourage po fluid intake--BUN up to 31 indicating dehydration. Cr improved off HCTZ and Ace 2.02--->1.78.  9.  HTN: Monitor BP for recurrent orthostatic changes/dizziness. Will need to tolerate high BP to prevent symptoms.  10.  Constipation: Enema tonight. Will add Miralax bid.     Post Admission Physician Evaluation: 1. Functional deficits secondary  to T 11 burst fracture with spondylolisthesis with L5 radiculopathy and thoracic muscle strain.  2. Patient is admitted to receive  collaborative, interdisciplinary care between the physiatrist, rehab nursing staff, and therapy team. 3. Patient's level of medical complexity and substantial therapy needs in context of that medical necessity cannot be provided at a lesser intensity of care such as a SNF. 4. Patient has experienced substantial functional loss from his/her baseline which was documented above under the "Functional History" and "Functional Status" headings.  Judging by the patient's diagnosis, physical exam, and functional history, the patient has potential for functional progress which will result in measurable gains while on inpatient rehab.  These gains will be of substantial and practical use upon discharge  in facilitating mobility and self-care at the household level. 5. Physiatrist will provide 24 hour management of medical needs as well as oversight of the therapy plan/treatment and provide guidance as appropriate regarding the interaction of the two. 6. 24 hour rehab nursing will assist with bladder management, bowel management, safety, skin/wound care, disease management, medication administration, pain management and patient education  and help integrate therapy concepts, techniques,education, etc. 7. PT will assess and treat for/with: Lower extremity strength, range of motion, stamina, balance, functional mobility, safety, adaptive techniques and equipment, NMR, pain mgt, nerve densitization, TLSO manipulation, back precautions.   Goals are: mod I. 8. OT will assess and treat for/with: ADL's, functional mobility, safety, upper extremity strength, adaptive techniques and equipment, NMR, pain mgt, back precautions, don/doff of TLSO.   Goals are: mod I except TLSO don/doff which may be supervision to min assist. 9. SLP will assess and treat for/with: n/a.  Goals are: n/a. 10. Case Management and Social Worker will assess and treat for psychological issues and discharge planning. 11. Team conference will be held  weekly to assess progress toward goals and to determine barriers to discharge. 12. Patient will receive at least 3 hours of therapy per day at least 5 days per week. 13. ELOS: 8-12 days       14. Prognosis:  excellent     Meredith Staggers, MD, [facility]   12/04/2013

## 2013-12-04 NOTE — Discharge Summary (Signed)
Physician Discharge Summary  Patient ID: Alexander Cox MRN: [id number] DOB/AGE: Oct 25, 1945 68 y.o.  Admit date: 11/30/2013 Discharge date: 12/04/2013  Discharge Diagnoses Patient Active Problem List   Diagnosis Date Noted  . Dehydration 12/01/2013  . Multiple fractures of ribs of left side 12/01/2013  . HTN (hypertension) 12/01/2013  . Hyperlipidemia 12/01/2013  . Acute renal insufficiency 12/01/2013  . T11 vertebral fracture 11/30/2013  . Hypotension 11/30/2013  . Syncope 11/30/2013    Consultants Dr. Earnie Larsson for neurosurgery  Dr. Mertie Moores for cardiology  Dr. Alysia Penna for PM&R   Procedures None   HPI: [name] was up on a ladder about 20 feet when he fell. There was a positive loss of consciousness but he's unsure if it was a result of the fall or the cause of it. He had been having a problem with low blood pressure recently. He was originally a level 2 trauma but was hypotensive in the field and was upgraded to level 1. He was hypotensive on arrival with a SBP in the 80's. Workup included CT scans of the head, cervical spine, chest, abdomen, and pelvis and showed the thoracic spine fractures and rib fractures. He was also noted to be dehydrated. Not coincidentally, he said he'd been having blood pressure problems since starting on a fluid pill for his hypertension. He was admitted to the trauma service and cardiology and neurosurgery were consulted.   Hospital Course: Initially the patient was neurologically intact but developed some paresthesias in his lower extremities when he was transferred from the ED stretcher to the ICU bed. MR imaging was ordered but took a couple of days to accomplish. This did not show any surgical lesion and neurosurgery maintained their recommendation to mobilize in a TLSO. Cardiology was consulted and recommended stopping his diuretic. His laboratory values that were consistent with dehydration returned to normal. He was mobilized with  physical and occupational therapies who recommended inpatient rehabilitation. They were consulted and agreed with transfer. Once insurance approval was obtained he was transferred there in good condition.      Medication List         aspirin EC 81 MG tablet  Take 81 mg by mouth daily.     calcium carbonate 600 MG Tabs tablet  Commonly known as:  OS-CAL  Take 600 mg by mouth daily.     cyclobenzaprine 5 MG tablet  Commonly known as:  FLEXERIL  Take 1 tablet (5 mg total) by mouth 3 (three) times daily as needed for muscle spasms.     FISH OIL PO  Take 1 capsule by mouth daily.     hydrochlorothiazide 12.5 MG tablet  Commonly known as:  HYDRODIURIL  Take 12.5 mg by mouth daily.     lisinopril 20 MG tablet  Commonly known as:  PRINIVIL,ZESTRIL  Take 10 mg by mouth 2 (two) times daily.     multivitamin with minerals Tabs tablet  Take 1 tablet by mouth daily.     simvastatin 20 MG tablet  Commonly known as:  ZOCOR  Take 20 mg by mouth daily.     VITAMIN D PO  Take 1,000 Units by mouth daily.             Follow-up Information   Follow up with Darden Amber., MD On 01/28/2014. (at 8:45 am)    Specialty:  Cardiology   Contact information:   [address] [place] [address] [phone]       Schedule  an appointment as soon as possible for a visit with Charlie Pitter, MD.   Specialty:  Neurosurgery   Contact information:   [address] [place] [address] [address] [phone]       Call [facility]. (As needed)    Contact information:   [address] [place] [address] [phone]       Signed: Lisette Abu, PA-C Pager: [phone] General Trauma PA Pager: [phone] 12/04/2013, 3:47 PM

## 2013-12-04 NOTE — Progress Notes (Signed)
Patient was able to sit upright for more than 30 minutes today. Still having some right buttocks and posterior thigh pain consistent with L5 radicular symptoms. These are better than they were yesterday. He continues to have no symptoms of weakness or numbness.  Afebrile. Vital stable. Examination stable. Still with some chronic left-sided L5 and S1 dermatomal numbness no other deficits present.  Followup lateral thoracic x-rays upright in a brace demonstrates minimal compression of his T11 fracture. There is no evidence of new retropulsion or other significant structural problem. The overall appearance looks promising for this injury to heal with bracing alone.  Overall stable. Patient may continue to undergo physical and occupational therapy. He is a good candidate for re\re of the location. I do not think any type of operative intervention will be necessary for his T11 fracture. With regard to his foraminal stenosis and L5 radiculopathy at L5-S1 I think the patient should gradually improve with time. If his symptoms become more bothersome we could consider an epidural steroid injection. I do not think that the Rex surgical decompression is necessary at this point.

## 2013-12-04 NOTE — Progress Notes (Signed)
Trauma Service Note  Subjective: Patient still having back pain as expected.  Awaiting insurance decision about Rehab.  We think he would benefit from Rehab  Objective: Vital signs in last 24 hours: Temp:  [97.7 F (36.5 C)-99.2 F (37.3 C)] 98.1 F (36.7 C) (07/24 0800) Pulse Rate:  [62-80] 72 (07/24 0800) Resp:  [11-19] 16 (07/24 0800) BP: (95-158)/(45-75) 147/74 mmHg (07/24 0800) SpO2:  [89 %-100 %] 100 % (07/24 0800) Last BM Date: 11/30/13  Intake/Output from previous day: 07/23 0701 - 07/24 0700 In: 1095 [P.O.:920; I.V.:175] Out: 1825 [Urine:1825] Intake/Output this shift: Total I/O In: 360 [P.O.:360] Out: 250 [Urine:250]  General: No acute distress  Lungs: Clear  Abd: Benign, mildly distended, great bowel sounds.  Extremities: No clinical signs or symptoms of DVT  Neuro: Intact   Lab Results: CBC   Recent Labs  12/02/13 0219  WBC 8.8  HGB 12.8*  HCT 38.5*  PLT 169   BMET  Recent Labs  12/02/13 0219 12/04/13 0220  NA 136* 139  K 4.8 4.3  CL 97 100  CO2 25 29  GLUCOSE 103* 103*  BUN 21 14  CREATININE 1.40* 1.17  CALCIUM 8.9 8.9   PT/INR No results found for this basename: LABPROT, INR,  in the last 72 hours ABG No results found for this basename: PHART, PCO2, PO2, HCO3,  in the last 72 hours  Studies/Results: Dg Thoracic Spine 2 View  12/03/2013   CLINICAL DATA:  Thoracic spine fracture or  EXAM: THORACIC SPINE - 2 VIEW  COMPARISON:  CT thoracic spine 11/30/2013  FINDINGS: Again noted is a burst fracture at T11 which was better characterized on the recent CT of the thoracic spine dated 11/30/2013. No significant interval change. Partially visualized is a nondisplaced fracture of the posterior elements of T10. The alignment is anatomic. There is no static listhesis. The disc spaces are maintained.  The visualized portions of the lungs are clear.  IMPRESSION: 1. Stable T11 burst fracture. 2. Stable nondisplaced T10 posterior element fracture.    Electronically Signed   By: Kathreen Devoid   On: 12/03/2013 15:41   Dg Lumbar Spine 2-3 Views  12/03/2013   CLINICAL DATA:  T11 fracture.  Standing in brace lumbar spine  EXAM: LUMBAR SPINE - 2-3 VIEW  COMPARISON:  11/30/2013 lumbar spine CT  FINDINGS: The nondisplaced left transverse process fracture at L1 is essentially radiographically occult, but was seen at recent CT scan.  Right nephrolithiasis. 3 mm of grade 1 anterolisthesis at L4-5 with facet arthropathy especially on the left at L4-5 and L5-S1. There is 4 mm anterolisthesis at L5-S1.  The T11 level is not included on today's lumbar spine exam.  IMPRESSION: 1. Stable appearance of the lumbar spine, with left facet arthropathy at L4-5 and L5-S1, and grade 1 anterolisthesis at both of these levels as well. 2. The known left L1 transverse process fracture is not radiographically visible. 3. Right nephrolithiasis.   Electronically Signed   By: Sherryl Barters M.D.   On: 12/03/2013 15:41    Anti-infectives: Anti-infectives   None      Assessment/Plan: s/p  Transfer to floor Hopefully can go to Rehab soon.  LOS: 4 days   Kathryne Eriksson. Dahlia Bailiff, MD, FACS [phone] Trauma Surgeon 12/04/2013

## 2013-12-04 NOTE — Clinical Social Work Note (Signed)
Clinical Social Worker continuing to follow patient and family for support and discharge planning needs.  Patient and family are hopeful for insurance approval for patient to admit to inpatient rehab.  Patient and patient wife do not express further social work concerns at this time.  Clinical Social Worker will sign off for now as social work intervention is no longer needed. Please consult [place] again if new need arises.  Alexander Cox, [place]

## 2013-12-04 NOTE — Progress Notes (Signed)
Physical Therapy Treatment Patient Details Name: Alexander Cox MRN: [id number] DOB: January 16, 1946 Today's Date: 12/04/2013    History of Present Illness 68 year old male, up on a ladder and apparently had a syncopal spell and fell. He suffered a T11 fx. He was dehydrated on admission with hypotension. Spinal fractures to be managed conservatively with TLSO. Orders recieved for PT.    PT Comments    Pt continues to be limited by pain (Rt hip > back). Wife observed and was educated on donning brace in supine, however she has not yet attempted to don brace herself. Pt has 5 steps to enter home with no rails and needs to be tolerating mobility much better to be able to perform stair training (especially with pt limiting weight bearing on RLE due to pain).    Follow Up Recommendations  CIR     Equipment Recommendations   (TBD)    Recommendations for Other Services Rehab consult     Precautions / Restrictions Precautions Precautions: Back Precaution Comments: pt able to state 2/3 precautions initially; at end could state 3/3; min cues to maintain (especially no twisting) Required Braces or Orthoses: Spinal Brace Spinal Brace: Thoracolumbosacral orthotic;Applied in supine position Restrictions Weight Bearing Restrictions: No    Mobility  Bed Mobility Overal bed mobility: Needs Assistance Bed Mobility: Rolling;Sidelying to Sit Rolling: Min assist (with rail) Sidelying to sit: Mod assist       General bed mobility comments: rolled Lt for donning brace with vc for technique to prevent twisting and minimize pain; wife present and observed donning brace with verbal instructions given; vc and physical assist for roll and side to sit  Transfers Overall transfer level: Needs assistance Equipment used: Rolling walker (2 wheeled) Transfers: Sit to/from Stand Sit to Stand: Mod assist;From elevated surface (bed elevated to simulate home)         General transfer comment: VCs for hand  placement and safety, patient with significant pain during transitional movement, assist to elevate to standing and to provide support in standing  Ambulation/Gait Ambulation/Gait assistance: Min assist;+2 safety/equipment Ambulation Distance (Feet): 25 Feet Assistive device: Rolling walker (2 wheeled) Gait Pattern/deviations: Step-to pattern;Decreased weight shift to right;Antalgic;Wide base of support Gait velocity: decreased Gait velocity interpretation: <1.8 ft/sec, indicative of risk for recurrent falls General Gait Details: Pt continues with primary complaint of pain in Rt hip area and denies pain down into RLE; pt externally rotates RLE with limited weight bearing due to pain; heavy reliance on UE support on RW   Stairs            Wheelchair Mobility    Modified Rankin (Stroke Patients Only)       Balance Overall balance assessment: Needs assistance Sitting-balance support: Bilateral upper extremity supported;Feet supported Sitting balance-Leahy Scale: Poor Sitting balance - Comments: could not take hands off bed and maintain balance   Standing balance support: Bilateral upper extremity supported Standing balance-Leahy Scale: Poor Standing balance comment: heavy reliance on bil UEs and LLE                    Cognition Arousal/Alertness: Awake/alert Behavior During Therapy: WFL for tasks assessed/performed Overall Cognitive Status: Within Functional Limits for tasks assessed                      Exercises      General Comments General comments (skin integrity, edema, etc.): Pt continues to breath hold while mobilizing (due to pain) with cues for deep breathing to  also facilitate muscle relaxation to assist with pain control      Pertinent Vitals/Pain 2/10 back pain at rest; 8/10 Rt hip pain with activity/ambulation; patient repositioned for comfort; pt was premedicated with pain medicine 1 hour prior to session    Home Living                       Prior Function            PT Goals (current goals can now be found in the care plan section) Acute Rehab PT Goals Patient Stated Goal: to be independent Progress towards PT goals: Progressing toward goals    Frequency  Min 4X/week    PT Plan Current plan remains appropriate    Co-evaluation             End of Session Equipment Utilized During Treatment: Gait belt;Back brace Activity Tolerance: Patient limited by pain Patient left: in chair;with call bell/phone within reach;with family/visitor present     Time: 4765-4650 PT Time Calculation (min): 25 min  Charges:  $Gait Training: 8-22 mins $Therapeutic Activity: 8-22 mins                    G Codes:      Aviv Lengacher 15-Dec-2013, 10:45 AM Pager [phone]

## 2013-12-04 NOTE — PMR Pre-admission (Signed)
PMR Admission Coordinator Pre-Admission Assessment  Patient: Alexander Cox is an 68 y.o., male MRN: [id number] DOB: 1946/01/18 Height: 5\' 7"  (170.2 cm) Weight: 70.9 kg (156 lb 4.9 oz)              Insurance Information HMO:     PPO: yes     PCP:      IPA:      80/20:      OTHER: Medicare advantage plan  PRIMARY: A[employer] Medicare      Policy#: [id number]      Subscriber: pt CM Name: Joaquin Music      Phone#: [phone]     Fax#: [phone] Pre-Cert#: [id number]      Employer: retired to f/u with onsite Alexander Cox [phone] Benefits:  Phone #: [phone]     Name: 7/24 Eff. Date: 05/14/13     Deduct: none      Out of Pocket Max: $3200      Life Max: none CIR: $200 copay per day days 1-10       SNF: no copay days 1-20; $75 copay per day days 21-52; no copay days 53-100 Outpatient: 90%     Co-Pay: 10% Home Health: 100%      Co-Pay: none DME: 80%     Co-Pay: 20% Providers: in network  SECONDARY: none       Medicaid Application Date:       Case Manager:  Disability Application Date:       Case Worker:   Emergency Facilities manager Information   Name Relation Home Work Mobile   Pegg,Pat Spouse [phone]  [phone]     Current Medical History  Patient Admitting Diagnosis:Multitrauma with T11 burst fracture as well as spondylolisthesis with right L5 radiculopathy after a fall 11/30/2013  History of Present Illness: Alexander Cox is a 68 y.o. male with history of celiac disease, HTN with recent medication changes with episodic of dizziness and near syncope.   On 11/30/13, he was up 20 feet on a ladder when he developed recurrent dizziness but continued working. He had LOC with fall and onset of lower thoracic pain. In ED he was found to be hypotensive with SBP in 80's and MRI of spine revealed unstable T-11 burst fracture, non displaced T2 fracture as well as non-displaced fracture of T- 10 posterior elements, L5/S1 anterolisthesis with ligamentous injury and left greater  than right low grade paraspinal muscle strain. He was noted to be dehydrated and hypotension improved with IVF. Dr. Annette Stable consulted and recommended immobilization with TLSO as patient without neurologic symptoms. 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 Cardiology recommended holding BP medications and 2D echo done revealing EF 50-55% with grade 1 diastolic dysfunction.   Per Dr. Annette Stable overall stable. Patient may continue to undergo physical and occupational therapy. He is a good candidate for re\re of the location. I do not think any type of operative intervention will be necessary for his T11 fracture. With regard to his foraminal stenosis and L5 radiculopathy at L5-S1 I think the patient should gradually improve with time. If his symptoms become more bothersome we could consider an epidural steroid injection. I do not think that the Rex surgical decompression is necessary at this point.  Past Medical History  Past Medical History  Diagnosis Date  . Hypertension   . High cholesterol   . Celiac disease   .  Nephrolithiasis     right    Family History  family history includes Hypertension in his paternal grandfather.  Prior Rehab/Hospitalizations: none  Current Medications  Current facility-administered medications:aspirin EC tablet 81 mg, 81 mg, Oral, Daily, Alexander Abu, PA-C, 81 mg at 12/04/13 1013;  calcium carbonate (OS-CAL - dosed in mg of elemental calcium) tablet 500 mg of elemental calcium, 1 tablet, Oral, Q breakfast, Alexander Abu, PA-C, 500 mg of elemental calcium at 12/04/13 0809 cholecalciferol (VITAMIN D) tablet 1,000 Units, 1,000 Units, Oral, Daily, Alexander Abu, PA-C, 1,000 Units at 12/04/13 1013;  docusate sodium (COLACE) capsule 100 mg, 100 mg, Oral, BID, Alexander Abu, PA-C, 100  mg at 12/04/13 1013;  enoxaparin (LOVENOX) injection 40 mg, 40 mg, Subcutaneous, Q24H, Alexander Abu, PA-C, 40 mg at 12/04/13 1013 HYDROmorphone (DILAUDID) injection 0.5 mg, 0.5 mg, Intravenous, Q4H PRN, Alexander Abu, PA-C, 0.5 mg at 12/01/13 1202;  ondansetron [facility]) injection 4 mg, 4 mg, Intravenous, Q6H PRN, Alexander Abu, PA-C;  ondansetron [facility]) tablet 4 mg, 4 mg, Oral, Q6H PRN, Alexander Abu, PA-C;  oxyCODONE (Oxy IR/ROXICODONE) immediate release tablet 5-15 mg, 5-15 mg, Oral, Q4H PRN, Alexander Abu, PA-C, 10 mg at 12/04/13 1459 pantoprazole (PROTONIX) EC tablet 40 mg, 40 mg, Oral, Daily, Alexander Abu, PA-C, 40 mg at 12/04/13 1013;  polyethylene glycol (MIRALAX / GLYCOLAX) packet 17 g, 17 g, Oral, Daily, Alexander Abu, PA-C, 17 g at 12/04/13 0809;  polyvinyl alcohol (LIQUIFILM TEARS) 1.4 % ophthalmic solution 1 drop, 1 drop, Both Eyes, PRN, Rolm Bookbinder, MD, 1 drop at 12/02/13 0151 simvastatin (ZOCOR) tablet 20 mg, 20 mg, Oral, Daily, Alexander Abu, PA-C, 20 mg at 12/03/13 1020;  tiZANidine (ZANAFLEX) tablet 4 mg, 4 mg, Oral, Q8H PRN, Zenovia Jarred, MD, 4 mg at 12/03/13 2249  Patients Current Diet: Gluten Restricted  Precautions / Restrictions Precautions Precautions: Back Precaution Booklet Issued: Yes (comment) Precaution Comments: pt able to state 2/3 precautions initially; at end could state 3/3; min cues to maintain (especially no twisting) Spinal Brace: Thoracolumbosacral orthotic;Applied in supine position Restrictions Weight Bearing Restrictions: No   Prior Activity Level   Home Assistive Devices / Equipment Home Assistive Devices/Equipment: None Home Equipment: Walker - 4 wheels;Other (comment) (was his mother's)  Prior Functional Level Prior Function Level of Independence: Independent Independent and retired. 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 Unable to sit EOB without support of BUE Upper Body Bathing: Minimal assitance;Sitting Lower Body Bathing: Maximal assistance;Sit to/from stand Upper Body Dressing : Moderate assistance;Sitting Lower Body Dressing: Maximal assistance;Sit to/from stand Toilet Transfer: +2 for physical assistance;Moderate assistance Toileting- Clothing Manipulation and Hygiene: Maximal assistance;Sit to/from stand (to follow back precautions with TLSO) Functional mobility during ADLs: +2 for physical assistance;Moderate assistance General ADL Comments: Began education on back precautions and ADL. TLSO donned in supine    Mobility  Overal bed mobility: Needs Assistance Bed Mobility: Rolling;Sidelying to Sit Rolling: Min assist (with rail) Sidelying to sit: Mod assist General bed mobility comments: rolled Lt for donning brace with vc for technique to prevent twisting and minimize pain; wife  present and observed donning brace with verbal instructions given; vc and physical assist for roll and side to sit    Transfers  Overall transfer level: Needs assistance Equipment used: Rolling walker (2 wheeled) Transfers: Sit to/from Stand Sit to Stand: Mod assist;From elevated surface (bed elevated to simulate home) General transfer comment: VCs for hand placement and safety, patient with significant pain during transitional movement, assist to elevate to standing and to provide support in standing    Ambulation / Gait / Stairs / Wheelchair Mobility  Ambulation/Gait Ambulation/Gait assistance: Min assist;+2 safety/equipment Ambulation Distance (Feet): 25 Feet Assistive device: Rolling  walker (2 wheeled) Gait Pattern/deviations: Step-to pattern;Decreased weight shift to right;Antalgic;Wide base of support Gait velocity: decreased Gait velocity interpretation: <1.8 ft/sec, indicative of risk for recurrent falls General Gait Details: Pt continues with primary complaint of pain in Rt hip area and denies pain down into RLE; pt externally rotates RLE with limited weight bearing due to pain; heavy reliance on UE support on RW    Posture / Balance Dynamic Sitting Balance Sitting balance - Comments: could not take hands off bed and maintain balance    Special needs/care consideration Bowel mgmt: continent Bladder mgmt: continent    Previous Home Environment Living Arrangements: Spouse/significant other  Lives With: Spouse Available Help at Discharge: Family Type of Home: House Home Layout: One level Home Access: Stairs to enter Entrance Stairs-Rails: None Technical brewer of Steps: 5 Bathroom Shower/Tub: Multimedia programmer: Standard Bathroom Accessibility: Yes How Accessible: Accessible via walker Home Care Services: No Additional Comments: tub shower with curtain, standard height toliets  Discharge Living Setting Plans for Discharge Living Setting: Patient's home;Lives with (comment);Other (Comment) (spouse) Type of Home at Discharge: House Discharge Home Layout: One level Discharge Home Access: Stairs to enter Entrance Stairs-Rails: None Entrance Stairs-Number of Steps: 5 Discharge Bathroom Shower/Tub: Walk-in shower Discharge Bathroom Toilet: Standard Discharge Bathroom Accessibility: Yes How Accessible: Accessible via walker Does the patient have any problems obtaining your medications?: No  Social/Family/Support Systems Patient Roles: Spouse;Parent Contact Information: Myna Hidalgo, spouse Anticipated Caregiver: spouse Anticipated Caregiver's Contact Information: see above Discharge Plan Discussed with Primary Caregiver: Yes Is  Caregiver In Agreement with Plan?: Yes Does Caregiver/Family have Issues with Lodging/Transportation while Pt is in Rehab?: No Wife retired.  Goals/Additional Needs Patient/Family Goal for Rehab: Mod I with PT and OT Expected length of stay: ELOS 7 days Pt/Family Agrees to Admission and willing to participate: Yes Program Orientation Provided & Reviewed with Pt/Caregiver Including Roles  & Responsibilities: Yes  Decrease burden of Care through IP rehab admission: n/a  Possible need for SNF placement upon discharge:n/a  Patient Condition: This patient's condition remains as documented in the consult dated 12/03/2013, in which the Rehabilitation Physician determined and documented that the patient's condition is appropriate for intensive rehabilitative care in an inpatient rehabilitation facility. Will admit to inpatient rehab today.  Preadmission Screen Completed By:  Cleatrice Burke, 12/04/2013 3:47 PM ______________________________________________________________________   Discussed status with Dr. Naaman Plummer on 12/04/13 at  1547 and received telephone approval for admission today.  Admission Coordinator:  Cleatrice Burke, time 2952 Date 12/04/2013.

## 2013-12-05 ENCOUNTER — Inpatient Hospital Stay (HOSPITAL_COMMUNITY): Payer: Medicare Other | Admitting: Occupational Therapy

## 2013-12-05 ENCOUNTER — Inpatient Hospital Stay (HOSPITAL_COMMUNITY): Payer: Medicare Other | Admitting: Physical Therapy

## 2013-12-05 DIAGNOSIS — N289 Disorder of kidney and ureter, unspecified: Secondary | ICD-10-CM

## 2013-12-05 DIAGNOSIS — I959 Hypotension, unspecified: Secondary | ICD-10-CM

## 2013-12-05 DIAGNOSIS — S22009A Unspecified fracture of unspecified thoracic vertebra, initial encounter for closed fracture: Secondary | ICD-10-CM

## 2013-12-05 DIAGNOSIS — W19XXXA Unspecified fall, initial encounter: Secondary | ICD-10-CM

## 2013-12-05 DIAGNOSIS — IMO0002 Reserved for concepts with insufficient information to code with codable children: Secondary | ICD-10-CM

## 2013-12-05 DIAGNOSIS — Z5189 Encounter for other specified aftercare: Secondary | ICD-10-CM

## 2013-12-05 DIAGNOSIS — S2249XA Multiple fractures of ribs, unspecified side, initial encounter for closed fracture: Secondary | ICD-10-CM

## 2013-12-05 MED ORDER — GABAPENTIN 100 MG PO CAPS
100.0000 mg | ORAL_CAPSULE | Freq: Two times a day (BID) | ORAL | Status: DC
  Administered 2013-12-05 – 2013-12-06 (×4): 100 mg via ORAL
  Filled 2013-12-05 (×9): qty 1

## 2013-12-05 NOTE — Progress Notes (Signed)
Physical Therapy Session Note  Patient Details  Name: Alexander Cox MRN: [id number] Date of Birth: 04-24-1946  Today's Date: 12/05/2013 Time: 1300-1330 Time Calculation (min): 30 min  Short Term Goals: Week 1:  PT Short Term Goal 1 (Week 1): Pt will perform bed mobility with min A, HOB flat and no rail.  PT Short Term Goal 2 (Week 1): Pt will perform bed <> w/c transfers with supervision.  PT Short Term Goal 3 (Week 1): Pt will ambulate 30 ft using RW and min A.  PT Short Term Goal 4 (Week 1): Pt will negotiate up/down 5 stairs using 2 rails and min A.  PT Short Term Goal 5 (Week 1): Pt will propel w/c x 150 ft with supervision.  Skilled Therapeutic Interventions/Progress Updates:  Pt received supine in bed, wife present for session. Pt rolled R/L with rail while therapist donned TLSO in supine, wife educated on TLSO. Pt transferred sit <> supine with HOB flat and use of rail, mod A. Sitting EOB, challenged pt's sitting balance by removing alt UE progressed to BUE support for brief periods of time and encouraging midline positioning as patient demonstrates L lean to decrease pressure/symptoms on R side. Transitioned to sit <> stand with focus on safe hand placement with min A and challenged static standing balance by removing alt UE support on RW, pt with increased difficulty lifting RUE < 5 sec vs lifting LUE up to 10 sec. Gait training in room and hallway using RW x 20 ft and x 50 ft with overall min A and w/c follow for safety, vc's for upright posture and forward gaze and pt initiating progression from step-to pattern to step-through pattern with increased fluidity of movement and decreased UE dependence. Pt with report of 8/10 pain in R hip. Pt returned to bed and left supine with all needs within reach and wife present.   Therapy Documentation Precautions:  Precautions Precautions: Back Precaution Comments: no BAT  Required Braces or Orthoses: Spinal Brace Spinal Brace:  Thoracolumbosacral orthotic;Applied in supine position Restrictions Weight Bearing Restrictions: No  See FIM for current functional status  Therapy/Group: Individual Therapy  Laretta Alstrom 12/05/2013, 1:31 PM

## 2013-12-05 NOTE — Evaluation (Signed)
Occupational Therapy Assessment and Plan  Patient Details  Name: Alexander Cox MRN: [id number] Date of Birth: 09-09-1945  OT Diagnosis: acute pain and muscle weakness (generalized) Rehab Potential: Rehab Potential: Good ELOS: 10-12 days   Today's Date: 12/05/2013 Time: 0800-0900 Time Calculation (min): 60 min  Problem List:  Patient Active Problem List   Diagnosis Date Noted  . Traumatic burst fracture of thoracic vertebra 12/04/2013  . Dehydration 12/01/2013  . Multiple fractures of ribs of left side 12/01/2013  . HTN (hypertension) 12/01/2013  . Hyperlipidemia 12/01/2013  . Acute renal insufficiency 12/01/2013  . T11 vertebral fracture 11/30/2013  . Hypotension 11/30/2013  . Syncope 11/30/2013    Past Medical History:  Past Medical History  Diagnosis Date  . Hypertension   . High cholesterol   . Celiac disease   . Nephrolithiasis     right   Past Surgical History: No past surgical history on file.  Assessment & Plan Clinical Impression: Patient is a 68 y.o. year old male history of celiac disease, HTN with recent medication changes with episodic of dizziness and near syncope. On 11/30/13, he was up 20 feet on a ladder when he developed recurrent dizziness but continued working. He had LOC with fall and onset of lower thoracic pain. In ED he was found to be hypotensive with SBP in 80's and MRI of spine revealed unstable T-11 burst fracture, non displaced T2 fracture as well as non-displaced fracture of T- 10 posterior elements, L5/S1 anterolisthesis with ligamentous injury and left greater than right low grade paraspinal muscle strain. He was noted to be dehydrated and hypotension improved with IVF. Dr. Annette Stable consulted and recommended immobilization with TLSO as patient without neurologic symptoms.  Patient with RLE pain due to L5 radiculopathy from degenerative spondylolisthesis with nerve root irritation and NS recommends slow mobilization with question of steroids for  symptom management v/s eventual decompression. Cardiology recommended holding BP medications and 2D echo done revealing EF 50-55% with grade 1 diastolic dysfunction. Follow up upright thoracic films done today showing stable fracture. Therapy ongoing and patient with decrease in RLE pain.  Patient transferred to CIR on 12/04/2013 .    Patient currently requires min to total A  with basic self-care skills and mod A for basic mobility secondary to muscle weakness and acute pain in right hip, numbness and decr sensation in LEs right >left, decreased cardiorespiratoy endurance and decreased sitting balance, decreased standing balance, decreased postural control and decreased balance strategies.  Prior to hospitalization, patient could complete ADL with independent .  Patient will benefit from skilled intervention to decrease level of assist with basic self-care skills and increase independence with basic self-care skills prior to discharge home with care partner.  Anticipate patient will require intermittent A with brace and follow up home health.  OT - End of Session Activity Tolerance: Tolerates 10 - 20 min activity with multiple rests Endurance Deficit: Yes Endurance Deficit Description: and limited by pain OT Assessment Rehab Potential: Good OT Patient demonstrates impairments in the following area(s): Balance;Endurance;Motor;Pain;Safety OT Basic ADL's Functional Problem(s): Grooming;Bathing;Dressing;Toileting OT Advanced ADL's Functional Problem(s): Simple Meal Preparation OT Transfers Functional Problem(s): Toilet;Tub/Shower OT Additional Impairment(s): None OT Plan OT Intensity: Minimum of 1-2 x/day, 45 to 90 minutes OT Frequency: 5 out of 7 days OT Duration/Estimated Length of Stay: 10-12 days OT Treatment/Interventions: Balance/vestibular training;Community reintegration;DME/adaptive equipment instruction;Discharge planning;Pain management;Self Care/advanced ADL retraining;Therapeutic  Activities;Therapeutic Exercise;UE/LE Strength taining/ROM;Psychosocial support;Functional mobility training;Patient/family education;Wheelchair propulsion/positioning OT Self Feeding Anticipated Outcome(s): no goal OT Basic  Self-Care Anticipated Outcome(s): supervision  OT Toileting Anticipated Outcome(s): mod I  OT Bathroom Transfers Anticipated Outcome(s): mod I to toilet - supervision to shower OT Recommendation Patient destination: Home Follow Up Recommendations: Home health OT Equipment Recommended: Tub/shower seat;Tub/shower bench;3 in 1 bedside comode   Skilled Therapeutic Intervention OT eval initiated with OT goals, purpose and role discussed with pt and wife. Self care retraining supine in bed for UB due to order to don brace in supine and LB sitting EOB and in supported sitting in w/c. Pt with increased pain in right hip and LE. with sitting unsupported at EOB and in prolonged standing. Pt required A for bilateral LE for management with rolling and with transitional movements from supine to sitting. Sit to stands with mod VC for safety and proper hand placement.  With prolonged standing pt with increased numbness in right LE- MD was aware during rounds. Discussed showering option with brace on and then returning to bed to doff brace and change pads.   OT Evaluation Precautions/Restrictions  Precautions Precautions: Back Precaution Comments: no BAT  Required Braces or Orthoses: Spinal Brace Spinal Brace: Thoracolumbosacral orthotic;Applied in supine position Restrictions Weight Bearing Restrictions: No General Chart Reviewed: Yes Family/Caregiver Present: Yes (wife) Vital Signs Therapy Vitals Pulse Rate: 65 BP: 138/81 mmHg (Patient can't tolerate standing and sitting position for v/s) Patient Position (if appropriate): Lying Pain Pain Assessment Pain Assessment: 0-10 Pain Score: 3  Pain Type: Acute pain Pain Location: Hip Pain Orientation: Right Pain Radiating  Towards: leg Pain Descriptors / Indicators: Aching Pain Frequency: Constant Pain Onset: With Activity (after activity) Pain Intervention(s): Medication (See eMAR) Home Living/Prior Functioning Home Living Available Help at Discharge: Family Type of Home: House Home Access: Stairs to enter Technical brewer of Steps: 5 Entrance Stairs-Rails: None Home Layout: One level  Lives With: Spouse ADL ADL ADL Comments: see FIM Vision/Perception  Vision- History Baseline Vision/History: Wears glasses Wears Glasses: At all times (glasses got lost in fall (onlyhas reading glasses here)) Patient Visual Report: No change from baseline Vision- Assessment Vision Assessment?: No apparent visual deficits  Cognition Overall Cognitive Status: Within Functional Limits for tasks assessed Arousal/Alertness: Awake/alert Orientation Level: Oriented X4 Safety/Judgment: Appears intact Comments: HOH Sensation Sensation Light Touch: Impaired Detail Light Touch Impaired Details: Impaired RLE Proprioception: Impaired Detail Proprioception Impaired Details: Impaired RLE Coordination Gross Motor Movements are Fluid and Coordinated: Yes Fine Motor Movements are Fluid and Coordinated: Yes Motor  Motor Motor - Skilled Clinical Observations: generalized weakness - especially in LEs right >left; limited by pain in sitting and standing Mobility  Bed Mobility Bed Mobility: Rolling Right;Rolling Left;Sit to Sidelying Right;Right Sidelying to Sit Rolling Right: 4: Min assist Rolling Left: 4: Min assist Right Sidelying to Sit: 3: Mod assist Supine to Sit: 4: Min assist Sit to Sidelying Right: 3: Mod assist Transfers Transfers: Sit to Stand;Stand to Sit Sit to Stand: 3: Mod assist Stand to Sit: 3: Mod assist  Trunk/Postural Assessment  Cervical Assessment Cervical Assessment: Within Functional Limits (slightly forward flexed) Thoracic Assessment Thoracic Assessment:  (limited by brace) Lumbar  Assessment Lumbar Assessment:  (limited by pain and brace)  Balance Balance Balance Assessed: Yes Static Sitting Balance Static Sitting - Balance Support: Bilateral upper extremity supported Static Sitting - Level of Assistance: 4: Min assist Dynamic Sitting Balance Sitting balance - Comments: could not take hands off bed and maintain balance- unable to sit unsupported for longer than 30 sec due to pain Static Standing Balance Static Standing - Balance Support: Bilateral  upper extremity supported;During functional activity Static Standing - Level of Assistance: 4: Min assist Static Standing - Comment/# of Minutes: unable to stand without bilateral UE support- pt reports increased pain and needs UEs to maintain posture and for pain management  Extremity/Trunk Assessment RUE Assessment RUE Assessment: Within Functional Limits LUE Assessment LUE Assessment: Within Functional Limits  FIM:  FIM - Grooming Grooming Steps: Wash, rinse, dry face;Wash, rinse, dry hands;Oral care, brush teeth, clean dentures;Brush, comb hair Grooming: 5: Set-up assist to obtain items FIM - Bathing Bathing Steps Patient Completed: Chest;Right Arm;Left Arm;Abdomen;Front perineal area Bathing: 3: Mod-Patient completes 5-7 [id number] parts or 50-74% FIM - Upper Body Dressing/Undressing Upper body dressing/undressing steps patient completed: Thread/unthread left sleeve of pullover shirt/dress;Thread/unthread right sleeve of pullover shirt/dresss;Put head through opening of pull over shirt/dress Upper body dressing/undressing: 4: Min-Patient completed 75 plus % of tasks FIM - Lower Body Dressing/Undressing Lower body dressing/undressing: 1: Total-Patient completed less than 25% of tasks FIM - Bed/Chair Transfer Bed/Chair Transfer: 3: Supine > Sit: Mod A (lifting assist/Pt. 50-74%/lift 2 legs;3: Sit > Supine: Mod A (lifting assist/Pt. 50-74%/lift 2 legs);3: Bed > Chair or W/C: Mod A (lift or lower assist) FIM -  Tub/Shower Transfers Tub/shower Transfers:  (unable to perform due to pain and dec sitting tolerance today)   Refer to Care Plan for Long Term Goals  Recommendations for other services: None  Discharge Criteria: Patient will be discharged from OT if patient refuses treatment 3 consecutive times without medical reason, if treatment goals not met, if there is a change in medical status, if patient makes no progress towards goals or if patient is discharged from hospital.  The above assessment, treatment plan, treatment alternatives and goals were discussed and mutually agreed upon: by patient and by family  SNicoletta Ba7/25/2015, 9:12 AM

## 2013-12-05 NOTE — Evaluation (Signed)
Physical Therapy Assessment and Plan  Patient Details  Name: Alexander Cox MRN: [id number] Date of Birth: 27-Mar-1946  PT Diagnosis: Difficulty walking, Impaired sensation, Muscle weakness and Pain in RLE Rehab Potential: Good ELOS: 10-12 days   Today's Date: 12/05/2013 Time: 1040-1130 Time Calculation (min): 50 min  Problem List:  Patient Active Problem List   Diagnosis Date Noted  . Traumatic burst fracture of thoracic vertebra 12/04/2013  . Dehydration 12/01/2013  . Multiple fractures of ribs of left side 12/01/2013  . HTN (hypertension) 12/01/2013  . Hyperlipidemia 12/01/2013  . Acute renal insufficiency 12/01/2013  . T11 vertebral fracture 11/30/2013  . Hypotension 11/30/2013  . Syncope 11/30/2013    Past Medical History:  Past Medical History  Diagnosis Date  . Hypertension   . High cholesterol   . Celiac disease   . Nephrolithiasis     right   Past Surgical History: No past surgical history on file.  Assessment & Plan Clinical Impression: Patient is a 68 y.o. year old male history of celiac disease, HTN with recent medication changes with episodic of dizziness and near syncope. On 11/30/13, he was up 20 feet on a ladder when he developed recurrent dizziness but continued working. He had LOC with fall and onset of lower thoracic pain. In ED he was found to be hypotensive with SBP in 80's and MRI of spine revealed unstable T-11 burst fracture, non displaced T2 fracture as well as non-displaced fracture of T- 10 posterior elements, L5/S1 anterolisthesis with ligamentous injury and left greater than right low grade paraspinal muscle strain. He was noted to be dehydrated and hypotension improved with IVF. Dr. Annette Stable consulted and recommended immobilization with TLSO as patient without neurologic symptoms.  Patient with RLE pain due to L5 radiculopathy from degenerative spondylolisthesis with nerve root irritation and NS recommends slow mobilization with question of steroids  for symptom management v/s eventual decompression. Cardiology recommended holding BP medications and 2D echo done revealing EF 50-55% with grade 1 diastolic dysfunction. Follow up upright thoracic films done today showing stable fracture. Therapy ongoing and patient with decrease in RLE pain. Patient transferred to CIR on 12/04/2013.   Patient currently requires mod-total A ton don TLSO in supine with mobility secondary to muscle weakness, decreased cardiorespiratoy endurance/activity tolerance, numbness/tingling and decreased sensation in RLE, acute pain in R hip, decreased balance, and decreased postural control.  Prior to hospitalization, patient was independent  with mobility and lived with Spouse in a House home.  Home access is 5 Stairs to enter.  Patient will benefit from skilled PT intervention to maximize safe functional mobility, minimize fall risk and decrease caregiver burden for planned discharge home with intermittent assist.  Anticipate patient will benefit from follow up [facility] at discharge.  PT - End of Session Activity Tolerance: Tolerates 30+ min activity with multiple rests Endurance Deficit: Yes Endurance Deficit Description: heavy dependence on UEs using RW, limited by pain PT Assessment Rehab Potential: Good Barriers to Discharge: Inaccessible home environment PT Patient demonstrates impairments in the following area(s): Balance;Endurance;Motor;Pain;Perception;Sensory PT Transfers Functional Problem(s): Bed Mobility;Bed to Chair;Car;Furniture PT Locomotion Functional Problem(s): Ambulation;Wheelchair Mobility;Stairs PT Plan PT Intensity: Minimum of 1-2 x/day ,45 to 90 minutes PT Frequency: 5 out of 7 days PT Duration Estimated Length of Stay: 10-12 days PT Treatment/Interventions: Ambulation/gait training;Balance/vestibular training;Discharge planning;DME/adaptive equipment instruction;Functional mobility training;Neuromuscular re-education;Pain management;Patient/family  education;Stair training;Therapeutic Activities;Therapeutic Exercise;UE/LE Strength taining/ROM;UE/LE Coordination activities;Wheelchair propulsion/positioning PT Transfers Anticipated Outcome(s): mod I PT Locomotion Anticipated Outcome(s): mod I to supervision PT Recommendation  Follow Up Recommendations: Home health PT Patient destination: Home Equipment Recommended: Wheelchair (measurements);Wheelchair cushion (measurements);Rolling walker with 5" wheels Equipment Details: anticipate needs above, TBD upon d/c  Skilled Therapeutic Intervention Skilled therapeutic intervention initiated after completion of evaluation. Discussed with patient falls risk, safety within room, and focus of therapy during stay. Discussed possible LOS, goals, and f/u therapy.  PT Evaluation Precautions/Restrictions Precautions Precautions: Back Precaution Comments: no BAT  Required Braces or Orthoses: Spinal Brace Spinal Brace: Thoracolumbosacral orthotic;Applied in supine position Restrictions Weight Bearing Restrictions: No General Chart Reviewed: Yes Family/Caregiver Present: No  Vital Signs Seated 157/74, HR 70  Unable to obtain in standing, machine error Pain Pain Assessment Pain Assessment: 0-10 Pain Score: 9  Pain Type: Acute pain Pain Location: Hip Pain Orientation: Right Pain Radiating Towards: leg Pain Descriptors / Indicators: Aching;Burning;Shooting;Tingling;Numbness Pain Frequency: Constant Pain Onset: With Activity Pain Intervention(s): Repositioned;Rest (premedicated) Home Living/Prior Functioning Home Living Available Help at Discharge: Family Type of Home: House Home Access: Stairs to enter Technical brewer of Steps: 5 Entrance Stairs-Rails: None Home Layout: One level  Lives With: Spouse Prior Function Level of Independence: Independent with basic ADLs;Independent with gait  Able to Take Stairs?: Yes Driving: Yes 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 Vision/Perception   No changes from baseline  Cognition Overall Cognitive Status: Within Advertising copywriter for tasks assessed Arousal/Alertness: Awake/alert Orientation Level: Oriented X4 Safety/Judgment: Appears intact Comments: HOH Sensation Sensation Light Touch: Impaired Detail Light Touch Impaired Details: Impaired RLE Proprioception: Impaired Detail Proprioception Impaired Details: Impaired RLE Additional Comments: nerve pain between R ankle/foot and increased numbness/tingling RLE with standing/activity Coordination Gross Motor Movements are Fluid and Coordinated: Yes Fine Motor Movements are Fluid and Coordinated: Yes Motor  Motor Motor: Within Functional Limits Motor - Skilled Clinical Observations: generalized weakness - especially in LEs right >left; limited by pain in sitting and standing  Mobility Bed Mobility Bed Mobility: Rolling Right;Rolling Left;Sit to Supine;Supine to Sit Rolling Right: 5: Supervision;With rail Rolling Left: 5: Supervision;With rail Right Sidelying to Sit: 3: Mod assist Supine to Sit: 3: Mod assist Supine to Sit Details: Verbal cues for precautions/safety Sit to Supine: 3: Mod assist Sit to Supine - Details: Verbal cues for precautions/safety Sit to Sidelying Right: 3: Mod assist Transfers Transfers: Yes Sit to Stand: 3: Mod assist Stand to Sit: 3: Mod assist Stand Pivot Transfers: 3: Mod assist;With armrests Stand Pivot Transfer Details: Verbal cues for technique Stand Pivot Transfer Details (indicate cue type and reason): vc's for safe hand placement for sit <> stand Locomotion  Ambulation Ambulation/Gait Assistance: 4: Min assist;1: +2 Total assist Ambulation Distance (Feet): 15 Feet Gait Gait: Yes Gait Pattern: Impaired Gait Pattern: Step-to pattern;Antalgic;Decreased step length - right;Decreased stance time - right Gait velocity: decreased Stairs / Additional Locomotion Stairs: Yes Stairs  Assistance: 3: Mod assist Stairs Assistance Details: Verbal cues for sequencing Stair Management Technique: Two rails;Forwards Number of Stairs: 3 Height of Stairs: 5 Architect: Yes Wheelchair Assistance: 5: Careers information officer: Both upper extremities Wheelchair Parts Management: Needs assistance Distance: 50 ft (limited by pain)  Trunk/Postural Assessment  Cervical Assessment Cervical Assessment: Within Functional Limits (slightly forward flexed) Thoracic Assessment Thoracic Assessment:  (limited by brace) Lumbar Assessment Lumbar Assessment:  (limited by pain and brace)  Balance Balance Balance Assessed: Yes Static Sitting Balance Static Sitting - Balance Support: Bilateral upper extremity supported;Feet supported Static Sitting - Level of Assistance: 4: Min assist;5: Stand by assistance Dynamic Sitting Balance Sitting balance -  Comments: could not take hands off bed and maintain balance- unable to sit unsupported for longer than 30 sec due to pain Static Standing Balance Static Standing - Balance Support: Bilateral upper extremity supported;During functional activity Static Standing - Level of Assistance: 4: Min assist Static Standing - Comment/# of Minutes: unable to stand without B UE support Extremity Assessment  RUE Assessment RUE Assessment: Within Functional Limits LUE Assessment LUE Assessment: Within Functional Limits RLE Assessment RLE Assessment: Exceptions to [facility] RLE Strength RLE Overall Strength: Deficits;Due to pain RLE Overall Strength Comments: grossly 3-/5 throughout LLE Assessment LLE Assessment: Within Functional Limits (grossly 4 to 4+/5 throughout)  FIM:  FIM - Control and instrumentation engineer Devices: Arm rests;Bed rails;Walker Bed/Chair Transfer: 3: Supine > Sit: Mod A (lifting assist/Pt. 50-74%/lift 2 legs;3: Sit > Supine: Mod A (lifting assist/Pt. 50-74%/lift 2 legs);3: Bed > Chair or  W/C: Mod A (lift or lower assist);3: Chair or W/C > Bed: Mod A (lift or lower assist) FIM - Locomotion: Wheelchair Distance: 50 ft (limited by pain) Locomotion: Wheelchair: 2: Travels 50 - 149 ft with supervision, cueing or coaxing FIM - Locomotion: Ambulation Locomotion: Ambulation Assistive Devices: Administrator Ambulation/Gait Assistance: 4: Min assist;1: +2 Total assist Locomotion: Ambulation: 1: Two helpers (w/c follow for safety) FIM - Locomotion: Stairs Locomotion: Scientist, physiological: Hand rail - 2 Locomotion: Stairs: 1: Up and Down < 4 stairs with moderate assistance (Pt: 50 - 74%)   Refer to Care Plan for Long Term Goals  Recommendations for other services: None  Discharge Criteria: Patient will be discharged from PT if patient refuses treatment 3 consecutive times without medical reason, if treatment goals not met, if there is a change in medical status, if patient makes no progress towards goals or if patient is discharged from hospital.  The above assessment, treatment plan, treatment alternatives and goals were discussed and mutually agreed upon: by patient  Laretta Alstrom 12/05/2013, 11:53 AM

## 2013-12-05 NOTE — Progress Notes (Signed)
Occupational Therapy Session Note  Patient Details  Name: Alexander Cox MRN: [id number] Date of Birth: Jun 14, 1945  Today's Date: 12/05/2013 Time: 2229-7989 Time Calculation (min): 45 min  Short Term Goals: Week 1:  OT Short Term Goal 1 (Week 1): Pt will tolerate bathing at shower level with TSLO on for 15 min  OT Short Term Goal 2 (Week 1): Pt will bathe with min A with AE prn at shower level  OT Short Term Goal 3 (Week 1): Pt will recall 3/3 back precautions functionally with min questioning cuing OT Short Term Goal 4 (Week 1): Pt will be able to instruct caregiver how to don brace properly with min questioning cuing OT Short Term Goal 5 (Week 1): Pt will transfer to toilet/ BSC with min A   Skilled Therapeutic Interventions/Progress Updates:    1:1 Introduced, demonstrated and had return demonstration of AE as option for LB dressing sitting EOB. Pt able to tolerate short brief moments of sitting without UE support to perform LB task with AE but when completed would return to supporting self with bilateral UEs. Also issued elastic shoe laces for increased independence with donning shoes. Continued to education pt and wife on incorporating back precautions in functional tasks, donning and doffing TLSO and bed positioning for comfort. Pt perform functional ambulation in the hallway 2 times from RN station to room with RW with min A with VC for posture and hand positioning for sit <>standing. Pt with increased pain in right hip with ambulation and isolated stretching and strengthening of right LE in sitting position (unsupported and supported.)  Pt returned to  bed to rest with wife present at end of session. Ice applied to right hip for comfort .  Therapy Documentation Precautions:  Precautions Precautions: Back Precaution Comments: no BAT  Required Braces or Orthoses: Spinal Brace Spinal Brace: Thoracolumbosacral orthotic;Applied in supine position Restrictions Weight Bearing  Restrictions: No Pain: Pain Assessment Pain Assessment: No/denies pain Pain Score: 4  Pain Type: Acute pain Pain Location: Hip Pain Orientation: Right Pain Radiating Towards: leg Pain Descriptors / Indicators: Aching Pain Onset: With Activity Pain Intervention(s): Medication (See eMAR)  7-8 with activity but relief with rest- offered ice at end of session for right hip ADL: ADL ADL Comments: see FIM  See FIM for current functional status  Therapy/Group: Individual Therapy  [name] [facility] 12/05/2013, 3:54 PM

## 2013-12-05 NOTE — Progress Notes (Signed)
Subjective/Complaints: Dizziness with position changes RLE shooting pain down to foot, reviewed NS note Review of Systems - Negative except as above, bowels moved Objective: Vital Signs: Blood pressure 138/81, pulse 65, temperature 98.3 F (36.8 C), temperature source Oral, resp. rate 17, weight 70.9 kg (156 lb 4.9 oz), SpO2 98.00%. Dg Thoracic Spine 2 View  12/03/2013   CLINICAL DATA:  Thoracic spine fracture or  EXAM: THORACIC SPINE - 2 VIEW  COMPARISON:  CT thoracic spine 11/30/2013  FINDINGS: Again noted is a burst fracture at T11 which was better characterized on the recent CT of the thoracic spine dated 11/30/2013. No significant interval change. Partially visualized is a nondisplaced fracture of the posterior elements of T10. The alignment is anatomic. There is no static listhesis. The disc spaces are maintained.  The visualized portions of the lungs are clear.  IMPRESSION: 1. Stable T11 burst fracture. 2. Stable nondisplaced T10 posterior element fracture.   Electronically Signed   By: Kathreen Devoid   On: 12/03/2013 15:41   Dg Lumbar Spine 2-3 Views  12/03/2013   CLINICAL DATA:  T11 fracture.  Standing in brace lumbar spine  EXAM: LUMBAR SPINE - 2-3 VIEW  COMPARISON:  11/30/2013 lumbar spine CT  FINDINGS: The nondisplaced left transverse process fracture at L1 is essentially radiographically occult, but was seen at recent CT scan.  Right nephrolithiasis. 3 mm of grade 1 anterolisthesis at L4-5 with facet arthropathy especially on the left at L4-5 and L5-S1. There is 4 mm anterolisthesis at L5-S1.  The T11 level is not included on today's lumbar spine exam.  IMPRESSION: 1. Stable appearance of the lumbar spine, with left facet arthropathy at L4-5 and L5-S1, and grade 1 anterolisthesis at both of these levels as well. 2. The known left L1 transverse process fracture is not radiographically visible. 3. Right nephrolithiasis.   Electronically Signed   By: Sherryl Barters M.D.   On: 12/03/2013 15:41    Results for orders placed during the hospital encounter of 11/30/13 (from the past 72 hour(s))  BASIC METABOLIC PANEL     Status: Abnormal   Collection Time    12/04/13  2:20 AM      Result Value Ref Range   Sodium 139  137 - 147 mEq/L   Potassium 4.3  3.7 - 5.3 mEq/L   Chloride 100  96 - 112 mEq/L   CO2 29  19 - 32 mEq/L   Glucose, Bld 103 (*) 70 - 99 mg/dL   BUN 14  6 - 23 mg/dL   Creatinine, Ser 1.17  0.50 - 1.35 mg/dL   Calcium 8.9  8.4 - 10.5 mg/dL   GFR calc non Af Amer 63 (*) >90 mL/min   GFR calc Af Amer 73 (*) >90 mL/min   Comment: (NOTE)     The eGFR has been calculated using the CKD EPI equation.     This calculation has not been validated in all clinical situations.     eGFR's persistently <90 mL/min signify possible Chronic Kidney     Disease.   Anion gap 10  5 - 15     HEENT: normal Cardio: RRR and no murmur Resp: CTA B/L and unlabored GI: BS positive and NT,ND Extremity:  No Edema Skin:   Intact Neuro: Alert/Oriented and Cranial Nerve II-XII normal Musc/Skel:  Other no tenderness over R hip, no pain with hip ROM, - SLR Gen NAD Motor strength is 5/5 bilateral deltoid, bicep, tricep, grip  3/5 in the right hip  flexor knee extensor 4-/5 right ankle dorsiflexor plantar flexor  4/5 left hip flexor 4/5 knee extensor 5/5 ankle dorsiflexor plantar flexor    Assessment/Plan: 1. Functional deficits secondary to T 11 burst fx, R L4/5 Radic which require 3+ hours per day of interdisciplinary therapy in a comprehensive inpatient rehab setting. Physiatrist is providing close team supervision and 24 hour management of active medical problems listed below. Physiatrist and rehab team continue to assess barriers to discharge/monitor patient progress toward functional and medical goals. FIM:                   Comprehension Comprehension Mode: Auditory Comprehension: 6-Follows complex conversation/direction: With extra time/assistive  device  Expression Expression Mode: Verbal Expression: 6-Expresses complex ideas: With extra time/assistive device  Social Interaction Social Interaction: 7-Interacts appropriately with others - No medications needed.  Problem Solving Problem Solving: 5-Solves basic problems: With no assist  Memory Memory: 6-More than reasonable amt of time  Medical Problem List and Plan:  1. Functional deficits secondary to Multitrauma with T11 burst fracture as well as spondylolisthesis with right L5 radiculopathy after a fall 11/30/2013  -Apply TLSO in supine  2. DVT Prophylaxis/Anticoagulation: Pharmaceutical: Lovenox  3. Pain Management: Will add oxycontin for more consistent pain management with flexeril for muscle spasms. Continue prn oxycodone.  4. Mood: Seems to be upbeat. Will have LCSW follow for evaluation and support.  5. Neuropsych: This patient is capable of making decisions on his own behalf.  6. Skin/Wound Care: No problems noted. Only mild abrasion to left elbow Continue to monitor for now.  7. ABLA: Due to trauma--hgb 13.9--->12.4. Will monitor for signs of bleeding. Recheck Monday.  8. Acute on chronic renal insufficiency: Continue to encourage po fluid intake--BUN up to 31 indicating dehydration. Cr improved off HCTZ and Ace 2.02--->1.78.  9. HTN: Monitor BP for recurrent orthostatic changes/dizziness. Will need to tolerate high BP to prevent symptoms.  10. Constipation: Enema tonight. Will add Miralax bid   LOS (Days) 1 A FACE TO FACE EVALUATION WAS PERFORMED  Adric Wrede E 12/05/2013, 8:29 AM

## 2013-12-06 ENCOUNTER — Inpatient Hospital Stay (HOSPITAL_COMMUNITY): Payer: Medicare Other | Admitting: *Deleted

## 2013-12-06 ENCOUNTER — Inpatient Hospital Stay (HOSPITAL_COMMUNITY): Payer: Medicare Other | Admitting: Physical Therapy

## 2013-12-06 NOTE — Progress Notes (Signed)
Subjective/Complaints: Pain controlled, BM this am Review of Systems - Negative except as above, bowels moved Objective: Vital Signs: Blood pressure 133/65, pulse 82, temperature 98.3 F (36.8 C), temperature source Oral, resp. rate 18, weight 70.9 kg (156 lb 4.9 oz), SpO2 98.00%. No results found. Results for orders placed during the hospital encounter of 11/30/13 (from the past 72 hour(s))  BASIC METABOLIC PANEL     Status: Abnormal   Collection Time    12/04/13  2:20 AM      Result Value Ref Range   Sodium 139  137 - 147 mEq/L   Potassium 4.3  3.7 - 5.3 mEq/L   Chloride 100  96 - 112 mEq/L   CO2 29  19 - 32 mEq/L   Glucose, Bld 103 (*) 70 - 99 mg/dL   BUN 14  6 - 23 mg/dL   Creatinine, Ser 1.17  0.50 - 1.35 mg/dL   Calcium 8.9  8.4 - 10.5 mg/dL   GFR calc non Af Amer 63 (*) >90 mL/min   GFR calc Af Amer 73 (*) >90 mL/min   Comment: (NOTE)     The eGFR has been calculated using the CKD EPI equation.     This calculation has not been validated in all clinical situations.     eGFR's persistently <90 mL/min signify possible Chronic Kidney     Disease.   Anion gap 10  5 - 15     HEENT: normal Cardio: RRR and no murmur Resp: CTA B/L and unlabored GI: BS positive and NT,ND Extremity:  No Edema Skin:   Intact Neuro: Alert/Oriented and Cranial Nerve II-XII normal Musc/Skel:  Other no tenderness over R hip, no pain with hip ROM, - SLR Gen NAD Motor strength is 5/5 bilateral deltoid, bicep, tricep, grip  3/5 in the right hip flexor knee extensor 4-/5 right ankle dorsiflexor plantar flexor  4/5 left hip flexor 4/5 knee extensor 5/5 ankle dorsiflexor plantar flexor    Assessment/Plan: 1. Functional deficits secondary to T 11 burst fx, R L4/5 Radic which require 3+ hours per day of interdisciplinary therapy in a comprehensive inpatient rehab setting. Physiatrist is providing close team supervision and 24 hour management of active medical problems listed below. Physiatrist and  rehab team continue to assess barriers to discharge/monitor patient progress toward functional and medical goals. FIM: FIM - Bathing Bathing Steps Patient Completed: Chest;Right Arm;Left Arm;Abdomen;Front perineal area Bathing: 3: Mod-Patient completes 5-7 [id number] parts or 50-74%  FIM - Upper Body Dressing/Undressing Upper body dressing/undressing steps patient completed: Thread/unthread left sleeve of pullover shirt/dress;Thread/unthread right sleeve of pullover shirt/dresss;Put head through opening of pull over shirt/dress Upper body dressing/undressing: 4: Min-Patient completed 75 plus % of tasks FIM - Lower Body Dressing/Undressing Lower body dressing/undressing: 1: Total-Patient completed less than 25% of tasks        FIM - BControl and instrumentation engineerDevices: Arm rests;Bed rails;Walker Bed/Chair Transfer: 3: Supine > Sit: Mod A (lifting assist/Pt. 50-74%/lift 2 legs;3: Sit > Supine: Mod A (lifting assist/Pt. 50-74%/lift 2 legs);3: Bed > Chair or W/C: Mod A (lift or lower assist);3: Chair or W/C > Bed: Mod A (lift or lower assist)  FIM - Locomotion: Wheelchair Distance: 50 ft (limited by pain) Locomotion: Wheelchair: 2: Travels 50 - 149 ft with supervision, cueing or coaxing FIM - Locomotion: Ambulation Locomotion: Ambulation Assistive Devices: WAdministratorAmbulation/Gait Assistance: 4: Min assist;1: +2 Total assist Locomotion: Ambulation: 1: Two helpers (w/c follow for safety)  Comprehension Comprehension Mode: Auditory  Comprehension: 6-Follows complex conversation/direction: With extra time/assistive device  Expression Expression Mode: Verbal Expression: 6-Expresses complex ideas: With extra time/assistive device  Social Interaction Social Interaction: 6-Interacts appropriately with others with medication or extra time (anti-anxiety, antidepressant).  Problem Solving Problem Solving: 5-Solves basic problems: With no assist  Memory Memory:  6-More than reasonable amt of time  Medical Problem List and Plan:  1. Functional deficits secondary to Multitrauma with T11 burst fracture as well as spondylolisthesis with right L5 radiculopathy after a fall 11/30/2013  -Apply TLSO in supine  2. DVT Prophylaxis/Anticoagulation: Pharmaceutical: Lovenox  3. Pain Management: Will add oxycontin for more consistent pain management with flexeril for muscle spasms. Continue prn oxycodone.  4. Mood: Seems to be upbeat. Will have LCSW follow for evaluation and support.  5. Neuropsych: This patient is capable of making decisions on his own behalf.  6. Skin/Wound Care: No problems noted. Only mild abrasion to left elbow Continue to monitor for now.  7. ABLA: Due to trauma--hgb 13.9--->12.4. Will monitor for signs of bleeding. Recheck Monday.  8. Acute on chronic renal insufficiency: Continue to encourage po fluid intake--BUN up to 31 indicating dehydration. Cr improved off HCTZ and Ace 2.02--->1.78.  9. HTN: Monitor BP for recurrent orthostatic changes/dizziness. Will need to tolerate high BP to prevent symptoms.  10. Constipation: Ebowels moved this am cont current Rx Miralax   LOS (Days) 2 A FACE TO FACE EVALUATION WAS PERFORMED  Geffrey Michaelsen E 12/06/2013, 8:36 AM

## 2013-12-06 NOTE — Progress Notes (Signed)
Physical Therapy Session Note  Patient Details  Name: Alexander Cox MRN: [id number] Date of Birth: 05-Jul-1945  Today's Date: 12/06/2013 Time: 1445-1530 Time Calculation (min): 45 min    Skilled Therapeutic Interventions/Progress Updates:  Patient in bed, assisted wife to put on A TLSO, 50% Vc needed for positioning the brace. Gait training with RW 5 x 40 feet with min A and cues to reduce weight bearing through B UE, focus on weight shifting and step length, w/c follow provided by wife.  TE: LAQ x15, marching in place against resistance in sitting, hamstring curls against theraband resistance 2 x 15.  In standing :reciprocal step ups on 4 inch step with B UE support and manual assistance of weight shifting and knee blocking for R side. Stairs 1 x 3 with mod A, patient had difficulty with turning around once he got off the stair case and needed maxA to turn towards the w/c.  W/c propulsion forward and back to increase use of B LE and facilitate strength.  Patient returned to bed with all needs within reach, wife present in the room.  Therapy Documentation Precautions:  Precautions Precautions: Back Precaution Comments: no BAT  Required Braces or Orthoses: Spinal Brace Spinal Brace: Thoracolumbosacral orthotic;Applied in supine position Restrictions Weight Bearing Restrictions: No Vital Signs: Therapy Vitals Temp: 98.3 F (36.8 C) Temp src: Oral Pulse Rate: 77 Resp: 18 BP: 135/76 mmHg Patient Position (if appropriate): Lying Oxygen Therapy SpO2: 100 % O2 Device: None (Room air) Pain: Pain Assessment Pain Assessment: 0-10 Pain Score: 4  Pain Type: Acute pain Pain Location: Hip Pain Orientation: Right Pain Descriptors / Indicators: Sore Pain Onset: On-going Pain Intervention(s): Medication (See eMAR)  See FIM for current functional status  Therapy/Group: Individual Therapy  Guadlupe Spanish 12/06/2013, 3:32 PM

## 2013-12-06 NOTE — Progress Notes (Signed)
Physical Therapy Session Note  Patient Details  Name: Alexander Cox MRN: [id number] Date of Birth: 11/19/1945  Today's Date: 12/06/2013 Time: 0900-1000 Time Calculation (min): 60 min  Short Term Goals: Week 1:  PT Short Term Goal 1 (Week 1): Pt will perform bed mobility with min A, HOB flat and no rail.  PT Short Term Goal 2 (Week 1): Pt will perform bed <> w/c transfers with supervision.  PT Short Term Goal 3 (Week 1): Pt will ambulate 30 ft using RW and min A.  PT Short Term Goal 4 (Week 1): Pt will negotiate up/down 5 stairs using 2 rails and min A.  PT Short Term Goal 5 (Week 1): Pt will propel w/c x 150 ft with supervision.  Skilled Therapeutic Interventions/Progress Updates:    Pt limited in session t/o by R hip pain that changes location and intensity while being position dependent.Pain likely consistent with posterior lumbar derangement , but repeated motion exercise contraindicated at this time with TLSO. Pt benefits in session from education on positioning able to decrease pain from 9/10 to 6/10. Pt would continue to benefit from skilled PT services to increase functional mobility.  Therapy Documentation Precautions:  Precautions Precautions: Back Precaution Comments: no BAT  Required Braces or Orthoses: Spinal Brace Spinal Brace: Thoracolumbosacral orthotic;Applied in supine position Restrictions Weight Bearing Restrictions: No Pain: Pain Assessment Pain Assessment: 0-10 Pain Score: 9  (in bed ) Pain Type: Acute pain Pain Location: Hip Pain Orientation: Right Pain Descriptors / Indicators: Sore Pain Onset: On-going Pain Intervention(s): Manual, graded activity Locomotion : Ambulation Ambulation/Gait Assistance: 4: Min assist  Balance: Static Sitting Balance Static Sitting - Balance Support: Bilateral upper extremity supported;Feet supported Static Sitting - Level of Assistance: 5: Stand by assistance Dynamic Sitting Balance Dynamic Sitting - Level of  Assistance: 4: Min assist Dynamic Sitting - Balance Activities: Lateral lean/weight shifting;Forward lean/weight shifting;Reaching for objects Static Standing Balance Static Standing - Balance Support: Bilateral upper extremity supported;During functional activity Static Standing - Level of Assistance: 4: Min assist Other Treatments:  Pt performs hip add/abd, HS, marching, hip ext isometrics 2x10. Glute sets and ankle pumps 2x10. Pt educated on progressive mobility and positioning for pain relief. Kinesthetic awareness trained employed to see differences with with positioning techniques, B/L hamstring and quad release performed.  See FIM for current functional status  Therapy/Group: Individual Therapy  Monia Pouch 12/06/2013, 4:04 PM

## 2013-12-07 ENCOUNTER — Inpatient Hospital Stay (HOSPITAL_COMMUNITY): Payer: Medicare Other | Admitting: Physical Therapy

## 2013-12-07 ENCOUNTER — Inpatient Hospital Stay (HOSPITAL_COMMUNITY): Payer: Medicare Other | Admitting: *Deleted

## 2013-12-07 ENCOUNTER — Encounter (HOSPITAL_COMMUNITY): Payer: Medicare Other

## 2013-12-07 DIAGNOSIS — S22009A Unspecified fracture of unspecified thoracic vertebra, initial encounter for closed fracture: Secondary | ICD-10-CM

## 2013-12-07 DIAGNOSIS — S2249XA Multiple fractures of ribs, unspecified side, initial encounter for closed fracture: Secondary | ICD-10-CM

## 2013-12-07 DIAGNOSIS — I959 Hypotension, unspecified: Secondary | ICD-10-CM

## 2013-12-07 DIAGNOSIS — N289 Disorder of kidney and ureter, unspecified: Secondary | ICD-10-CM

## 2013-12-07 DIAGNOSIS — Z5189 Encounter for other specified aftercare: Secondary | ICD-10-CM

## 2013-12-07 DIAGNOSIS — IMO0002 Reserved for concepts with insufficient information to code with codable children: Secondary | ICD-10-CM

## 2013-12-07 DIAGNOSIS — W19XXXA Unspecified fall, initial encounter: Secondary | ICD-10-CM

## 2013-12-07 LAB — CBC WITH DIFFERENTIAL/PLATELET
BASOS PCT: 0 % (ref 0–1)
Basophils Absolute: 0 10*3/uL (ref 0.0–0.1)
Eosinophils Absolute: 0.2 10*3/uL (ref 0.0–0.7)
Eosinophils Relative: 3 % (ref 0–5)
HCT: 37.7 % — ABNORMAL LOW (ref 39.0–52.0)
Hemoglobin: 12.4 g/dL — ABNORMAL LOW (ref 13.0–17.0)
Lymphocytes Relative: 20 % (ref 12–46)
Lymphs Abs: 1.4 10*3/uL (ref 0.7–4.0)
MCH: 30.6 pg (ref 26.0–34.0)
MCHC: 32.9 g/dL (ref 30.0–36.0)
MCV: 93.1 fL (ref 78.0–100.0)
Monocytes Absolute: 0.6 10*3/uL (ref 0.1–1.0)
Monocytes Relative: 9 % (ref 3–12)
NEUTROS ABS: 4.7 10*3/uL (ref 1.7–7.7)
NEUTROS PCT: 68 % (ref 43–77)
PLATELETS: 217 10*3/uL (ref 150–400)
RBC: 4.05 MIL/uL — ABNORMAL LOW (ref 4.22–5.81)
RDW: 12.2 % (ref 11.5–15.5)
WBC: 7 10*3/uL (ref 4.0–10.5)

## 2013-12-07 LAB — COMPREHENSIVE METABOLIC PANEL
ALBUMIN: 3.2 g/dL — AB (ref 3.5–5.2)
ALT: 55 U/L — ABNORMAL HIGH (ref 0–53)
AST: 41 U/L — AB (ref 0–37)
Alkaline Phosphatase: 164 U/L — ABNORMAL HIGH (ref 39–117)
Anion gap: 12 (ref 5–15)
BILIRUBIN TOTAL: 0.8 mg/dL (ref 0.3–1.2)
BUN: 23 mg/dL (ref 6–23)
CHLORIDE: 99 meq/L (ref 96–112)
CO2: 27 mEq/L (ref 19–32)
Calcium: 8.8 mg/dL (ref 8.4–10.5)
Creatinine, Ser: 1.31 mg/dL (ref 0.50–1.35)
GFR calc Af Amer: 63 mL/min — ABNORMAL LOW (ref >90)
GFR calc non Af Amer: 55 mL/min — ABNORMAL LOW (ref >90)
Glucose, Bld: 90 mg/dL (ref 70–99)
POTASSIUM: 3.9 meq/L (ref 3.7–5.3)
SODIUM: 138 meq/L (ref 137–147)
Total Protein: 6.5 g/dL (ref 6.0–8.3)

## 2013-12-07 MED ORDER — GABAPENTIN 100 MG PO CAPS
200.0000 mg | ORAL_CAPSULE | Freq: Two times a day (BID) | ORAL | Status: DC
  Administered 2013-12-07 – 2013-12-08 (×3): 200 mg via ORAL
  Filled 2013-12-07 (×5): qty 2

## 2013-12-07 NOTE — Progress Notes (Signed)
Physical Medicine and Rehabilitation Consult  Reason for Consult: T 11 burst fracture, paraspinal muscle strain, L5 radiculopathy.  Referring Physician: Dr. Grandville Silos  HPI: Alexander Cox is a 68 y.o. male with history of celiac disease, HTN with recent medication changes with episodic of dizziness and near syncope. On 11/30/13, he was up 20 feet on a ladder when he developed recurrent dizziness but continued working. He had LOC with fall and onset of lower thoracic pain. In ED he was found to be hypotensive with SBP in 80's and MRI of spine revealed unstable T-11 burst fracture, non displaced T2 fracture as well as non-displaced fracture of T- 10 posterior elements, L5/S1 anterolisthesis with ligamentous injury and left greater than right low grade paraspinal muscle strain. He was noted to be dehydrated and hypotension improved with IVF. Dr. Annette Stable consulted and recommended immobilization with TLSO as patient without neurologic symptoms. 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 Cardiology recommended holding BP medications and 2D echo done revealing EF 50-55% with grade 1 diastolic dysfunction. PT/OT evaluations done today and CIR recommended by Rehab team and MD.  Review of Systems  HENT: Negative for hearing loss and tinnitus.  Eyes: Positive for blurred vision (Wears trifocals and lost his glasses during the fall. ).  Respiratory: Negative for cough and shortness of breath.  Cardiovascular: Negative for chest pain and palpitations.  Gastrointestinal: Positive for heartburn and constipation. Negative for nausea, vomiting, abdominal pain and diarrhea.  Genitourinary: Negative for dysuria and urgency.  Musculoskeletal: Positive for back pain and myalgias.  Neurological: Positive for dizziness and tingling (RLE tingling decreased  today). Negative for focal weakness and headaches.  Psychiatric/Behavioral: Negative for depression. The patient has insomnia (due to pain).   Past Medical History   Diagnosis  Date   .  Hypertension    .  High cholesterol    .  Celiac disease     History reviewed. No pertinent past surgical history.  Family History   Problem  Relation  Age of Onset   .  Hypertension  Paternal Grandfather     Social History: Married. Independent PTA. 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 He reports that he has quit smoking. He uses smokeless tobacco--chews 3-4/day. He reports that he does not drink alcohol or use illicit drugs.  Allergies   Allergen  Reactions   .  Gluten Meal     Medications Prior to Admission   Medication  Sig  Dispense  Refill   .  aspirin EC 81 MG tablet  Take 81 mg by mouth daily.     .  calcium carbonate (OS-CAL) 600 MG TABS tablet  Take 600 mg by mouth daily.     .  Cholecalciferol (VITAMIN D PO)  Take 1,000 Units by mouth daily.     .  hydrochlorothiazide (HYDRODIURIL) 12.5 MG tablet  Take 12.5 mg by mouth daily.     Marland Kitchen  lisinopril (PRINIVIL,ZESTRIL) 20 MG tablet  Take 10 mg by mouth 2 (two) times daily.     .  Multiple Vitamin (MULTIVITAMIN WITH MINERALS) TABS tablet  Take 1 tablet by mouth daily.     .  Omega-3 Fatty Acids (FISH OIL PO)  Take 1 capsule by mouth daily.     .  simvastatin (ZOCOR) 20 MG tablet  Take 20 mg by mouth daily.  Home:  Home Living  Family/patient expects to be discharged to:: Private residence  Living Arrangements: Spouse/significant other  Available Help at Discharge: Family  Type of Home: House  Home Access: Stairs to enter  Technical brewer of Steps: 5  Entrance Stairs-Rails: None  Home Layout: One [place]: Environmental consultant - 4 wheels;Other (comment) (was his mother's)  Additional Comments: tub shower with curtain, standard height toliets  Functional History:  Prior Function  Level of Independence: Independent   Functional Status:  Mobility:  Bed Mobility  Overal bed mobility: Needs Assistance  Bed Mobility: Rolling;Sidelying to Sit  Rolling: Mod assist  Sidelying to sit: Max assist  General bed mobility comments: significant increased pain with mobility, rolling side to side for donning of TLSO brace, assist to come to upright positioning, VCs for technique and sequencing  Transfers  Overall transfer level: Needs assistance  Equipment used: Rolling walker (2 wheeled)  Transfers: Sit to/from Stand  Sit to Stand: Mod assist;+2 physical assistance  General transfer comment: Increased assist, VCs for hand placement and safety, patient with significant pain during transitional movement, assist to elevate to standing and to provide support in standingf  Ambulation/Gait  Ambulation/Gait assistance: Min assist  Ambulation Distance (Feet): 12 Feet  Assistive device: Rolling walker (2 wheeled)  Gait Pattern/deviations: Step-to pattern;Decreased stride length;Decreased weight shift to right;Shuffle;Antalgic;Trunk flexed;Narrow base of support  Gait velocity: decreased  Gait velocity interpretation: <1.8 ft/sec, indicative of risk for recurrent falls  General Gait Details: Patient limited by pain, difficulty coming down through RLE secondary to increased hip pain   ADL:  ADL  Overall ADL's : Needs assistance/impaired  Grooming: Minimal assistance;Sitting  Grooming Details (indicate cue type and reason): needs to be supported. Unable to sit EOB without support of BUE  Upper Body Bathing: Minimal assitance;Sitting  Lower Body Bathing: Maximal assistance;Sit to/from stand  Upper Body Dressing : Moderate assistance;Sitting  Lower Body Dressing: Maximal assistance;Sit to/from stand  Toilet Transfer: +2 for physical assistance;Moderate assistance  Toileting- Clothing Manipulation and Hygiene: Maximal assistance;Sit to/from stand (to follow back precautions with TLSO)  Functional mobility during ADLs: +2  for physical assistance;Moderate assistance  General ADL Comments: Began education on back precautions and ADL. TLSO donned in supine  Cognition:  Cognition  Overall Cognitive Status: Within Functional Limits for tasks assessed  Orientation Level: Oriented X4  Cognition  Arousal/Alertness: Awake/alert  Behavior During Therapy: WFL for tasks assessed/performed  Overall Cognitive Status: Within Functional Limits for tasks assessed  Blood pressure 114/65, pulse 80, temperature 97.8 F (36.6 C), temperature source Oral, resp. rate 19, height 5\' 7"  (1.702 m), weight 70.9 kg (156 lb 4.9 oz), SpO2 99.00%.  Physical Exam  Nursing note and vitals reviewed.  Constitutional: He is oriented to person, place, and time. He appears well-developed and well-nourished.  HENT:  Head: Normocephalic and atraumatic.  Eyes: Conjunctivae are normal. Pupils are equal, round, and reactive to light.  Neck: Normal range of motion. Neck supple.  Cardiovascular: Normal rate and regular rhythm.  No murmur heard.  Respiratory: Effort normal and breath sounds normal. No respiratory distress. He has no wheezes.  GI: Soft. Bowel sounds are normal. He exhibits no distension. There is no tenderness.  Musculoskeletal: He exhibits no edema and no tenderness.  Pain right hip with SLR.  Neurological: He is alert and oriented to person, place, and time.  Skin: Skin is warm and dry.  Psychiatric: He has a normal mood and affect. His behavior is normal. Thought content normal.  no pain with straight leg raising bilaterally no pain with hip motion bilaterally  Motor strength is 5/5 bilateral deltoid, bicep, tricep, grip  4 minus/5 in the right hip flexor knee extensor ankle dorsiflexor plantar flexor  5/5 left hip flexor knee extensor ankle dorsiflexor plantar flexor  Sensation reduced to light touch right great toe  No results found for this or any previous visit (from the past 24 hour(s)).  Dg Thoracic Spine 2 View   12/03/2013 CLINICAL DATA: Thoracic spine fracture or EXAM: THORACIC SPINE - 2 VIEW COMPARISON: CT thoracic spine 11/30/2013 FINDINGS: Again noted is a burst fracture at T11 which was better characterized on the recent CT of the thoracic spine dated 11/30/2013. No significant interval change. Partially visualized is a nondisplaced fracture of the posterior elements of T10. The alignment is anatomic. There is no static listhesis. The disc spaces are maintained. The visualized portions of the lungs are clear. IMPRESSION: 1. Stable T11 burst fracture. 2. Stable nondisplaced T10 posterior element fracture. Electronically Signed By: Kathreen Devoid On: 12/03/2013 15:41  Dg Lumbar Spine 2-3 Views  12/03/2013 CLINICAL DATA: T11 fracture. Standing in brace lumbar spine EXAM: LUMBAR SPINE - 2-3 VIEW COMPARISON: 11/30/2013 lumbar spine CT FINDINGS: The nondisplaced left transverse process fracture at L1 is essentially radiographically occult, but was seen at recent CT scan. Right nephrolithiasis. 3 mm of grade 1 anterolisthesis at L4-5 with facet arthropathy especially on the left at L4-5 and L5-S1. There is 4 mm anterolisthesis at L5-S1. The T11 level is not included on today's lumbar spine exam. IMPRESSION: 1. Stable appearance of the lumbar spine, with left facet arthropathy at L4-5 and L5-S1, and grade 1 anterolisthesis at both of these levels as well. 2. The known left L1 transverse process fracture is not radiographically visible. 3. Right nephrolithiasis. Electronically Signed By: Sherryl Barters M.D. On: 12/03/2013 15:41  Mr Cervical Spine Wo Contrast  12/02/2013 CLINICAL DATA: Fall, loss of consciousness. EXAM: MRI CERVICAL SPINE WITHOUT CONTRAST TECHNIQUE: Multiplanar, multisequence MR imaging of the cervical spine was performed. No intravenous contrast was administered. COMPARISON: CT of the head and cervical spine November 30, 2013. FINDINGS: Cervical vertebral bodies and posterior elements appear intact and aligned  with maintenance of the cervical lordosis. Moderate to severe C6-7 degenerative disc disease, moderate at C4-5 with decreased T2 signal within all cervical discs most consistent with mild to moderate desiccation. Moderate to severe acute on chronic discogenic endplate changes I9-6. No STIR signal abnormality to suggest fracture though, STIR sequence is mildly motion degraded. Cervical spinal cord appears normal morphology and signal characteristics, no syrinx or myelomalacia. No cerebellar tonsillar ectopia. Craniocervical junction appears intact. Included prevertebral and paraspinal soft tissues are nonsuspicious. Level by level evaluation (vomiting motion degraded axial sequences limit evaluation): C2-3: Uncovertebral hypertrophy, mild facet arthropathy without canal stenosis or definite neural foraminal narrowing. C3-4: Canal bulging, uncovertebral hypertrophy. Severe right, mild left facet arthropathy. Mild canal stenosis with moderate to severe bilateral neural foraminal narrowing. C4-5: Annular bulging, uncovertebral hypertrophy. Moderate to severe bilateral facet arthropathy. Mild canal stenosis with apparent severe bilateral neural foraminal narrowing. C5-6: Minimal annular bulging, uncovertebral hypertrophy. Moderate right and severe left facet arthropathy. Mild canal stenosis. Apparent severe bilateral neural foraminal narrowing. C6-7: 2 mm broad-based disc bulge, uncovertebral hypertrophy at least mild facet arthropathy. Mild to moderate canal stenosis with severe bilateral neural foraminal narrowing. C7-T1: No disc bulge. Mild facet arthropathy without canal stenosis or neural foraminal narrowing. IMPRESSION: Cervical spondylosis without acute fracture nor malalignment. Mild motion degraded  examination without convincing evidence of cord contusion. Mild to moderate canal stenosis at C6-7, mild at C3-4 through C5-6. Neural foraminal narrowing C3-4 thru C6-7: Appearing severe at C4-5 through C6-7.  Electronically Signed By: Elon Alas On: 12/02/2013 01:05  Mr Thoracic Spine Wo Contrast  12/02/2013 ADDENDUM REPORT: 12/02/2013 01:53 ADDENDUM: Findings discussed with and reconfirmed by Dr.STERN on7/22/2015at1:50 am. Electronically Signed By: Elon Alas On: 12/02/2013 01:53  12/02/2013 CLINICAL DATA: Fall, loss of consciousness. EXAM: MRI THORACIC AND LUMBAR SPINE WITHOUT CONTRAST TECHNIQUE: Multiplanar and multiecho pulse sequences of the thoracic and lumbar spine were obtained without intravenous contrast. COMPARISON: CT of the chest, abdomen and pelvis December 08, 2013 FINDINGS: MR THORACIC SPINE FINDINGS Mild-to-moderate 3 column burst fracture T11 with less than 20% height loss, 1-2 mm retropulsed bony fragments. No malalignment. In addition, oblique nondisplaced fracture of T2 involving anterior and middle column without retropulsed bony fragments. Nondisplaced fractures of the posterior elements of T10 better seen on prior CT. Thoracic kyphosis maintained. Intervertebral disc demonstrate mild desiccation, with slight edema within mid T1 to disc, edema within the T10-11 and T11-12 discs. Scattered chronic Schmorl's nodes. Thoracic spinal cord appears normal morphology and signal characteristics the level of T12-L1 with the conus medullaris terminates. Upper thoracic grade 1 paraspinal muscle strain. Mid to lower thoracic grade 1/2 paraspinal muscle strain. Small bilateral pleural effusions. Annular bulging at T5-6. Right central small disc protrusion at T8-9. Mild lower lumbar facet arthropathy. No canal stenosis or neural foraminal narrowing at any level. MR LUMBAR SPINE FINDINGS Grade 1 L5-S1 anterolisthesis, perched left L5-S1 facet with fractured left L5 inferior articular facet, capsular edema. Widened L5-S1 interspinous space with bright signal consistent with ligamentous injury. Lumbar vertebra are intact. Maintenance of lumbar lordosis. The left L1 transverse process fracture seen on prior  examination is less conspicuous by MR imaging. Intervertebral disc heights generally preserved, with edema within the L5-S1 disc, as well as T11-12. Remaining discs are mildly desiccated, with equivocal disc injury at L3-4. Conus medullaris terminates at T12-L1 and appears normal in morphology and signal characteristics. Cauda equina is nonsuspicious. Interstitial edema about the left L5 facet capsule mid concerning for injury. Dependent subcutaneous edema with left greater than right grade 1 paraspinal muscle strain versus early denervation. Level by level evaluation: L1-2: Small extra foraminal right disc protrusion. Mild to moderate facet arthropathy and ligamentum flavum redundancy without canal stenosis. Minimal neural foraminal narrowing. L2-3: Minimal annular bulging. Mild facet arthropathy and ligamentum flavum redundancy with trace facet effusions which are likely reactive. No canal stenosis. Minimal neural foraminal narrowing bilaterally. L3-4: 3 mm broad-based disc bulge 5 mm nodular density posterior to the exiting right L3 nerve may reflect synovial cyst. Mild facet arthropathy and ligamentum flavum redundancy without canal stenosis. Moderate right and moderate to severe left neural foraminal narrowing. L4-5: Annular bulging, with left subarticular annular fissure. Moderate facet arthropathy and ligamentum flavum redundancy. Moderate facet arthropathy and ligamentum flavum redundancy, with trace left facet effusion which is likely reactive. No canal stenosis. Subcentimeter left facet synovial cyst directed posterior to the paraspinal soft tissues. Severe left greater than right neural foraminal narrowing. L5-S1: Moderate left central disc protrusion effacing the traversing left S1 nerve. Moderate to severe facet arthropathy, no canal stenosis. Moderate to severe right and at least moderate left neural foraminal narrowing. IMPRESSION: MR THORACIC SPINE IMPRESSION Three column unstable T11 burst fracture  with less than 20% height loss. No canal stenosis or neural foraminal narrowing. Nondisplaced T2 2 column burst fracture without retropulsed  bony fragments. Nondisplaced T10 posterior element fracture. No malalignment nor neurocompressive changes. Upper thoracic grade 1 paraspinal muscle strain, mid to lower thoracic grade 1/2 paraspinal muscle strain. MR LUMBAR SPINE IMPRESSION Grade 1 L5-S1 anterolisthesis, with perched left L5-S1 facet, fractured left L5 inferior articular facet, left L5-S1 capsular injury with interspinous L5-S1 ligamentous injury. Left greater than right low-grade paraspinal muscle strain. L4-5 annular fissure. Possible L3-4 intradiscal injury. No canal stenosis. Neural foraminal narrowing L2-3 through L5-S1: Severe at L4-5 bilaterally. Electronically Signed: By: Elon Alas On: 12/02/2013 01:37  Assessment/Plan:  Diagnosis: Multitrauma with T11 burst fracture as well as spondylolisthesis with right L5 radiculopathy after a fall 11/30/2013  1. Does the need for close, 24 hr/day medical supervision in concert with the patient's rehab needs make it unreasonable for this patient to be served in a less intensive setting? Yes 2. Co-Morbidities requiring supervision/potential complications: Hypertension, dehydration, pain management, renal insufficiency 3. Due to bladder management, bowel management, safety, skin/wound care, disease management, medication administration, pain management and patient education, does the patient require 24 hr/day rehab nursing? Yes 4. Does the patient require coordinated care of a physician, rehab nurse, PT (1-2 hrs/day, 5 days/week) and OT (1-2 hrs/day, 5 days/week) to address physical and functional deficits in the context of the above medical diagnosis(es)? Yes Addressing deficits in the following areas: balance, endurance, locomotion, strength, transferring, bowel/bladder control, bathing, dressing, feeding, grooming and toileting 5. Can the patient  actively participate in an intensive therapy program of at least 3 hrs of therapy per day at least 5 days per week? Yes 6. The potential for patient to make measurable gains while on inpatient rehab is excellent 7. Anticipated functional outcomes upon discharge from inpatient rehab are modified independent with PT, modified independent with OT, n/a with SLP. 8. Estimated rehab length of stay to reach the above functional goals is: 7 days 9. Does the patient have adequate social supports to accommodate these discharge functional goals? Yes 10. Anticipated D/C setting: Home 11. Anticipated post D/C treatments: [place] therapy 12. Overall Rehab/Functional Prognosis: excellent RECOMMENDATIONS:  This patient's condition is appropriate for continued rehabilitative care in the following setting: CIR  Patient has agreed to participate in recommended program. Yes  Note that insurance prior authorization may be required for reimbursement for recommended care.  Comment: Family very supportive  12/03/2013

## 2013-12-07 NOTE — Progress Notes (Signed)
Occupational Therapy Session Note  Patient Details  Name: Alexander Cox MRN: [id number] Date of Birth: 1945/11/25  Today's Date: 12/07/2013 Time: 1032-1132 Time Calculation (min): 60 min  Short Term Goals: Week 1:  OT Short Term Goal 1 (Week 1): Pt will tolerate bathing at shower level with TSLO on for 15 min  OT Short Term Goal 2 (Week 1): Pt will bathe with min A with AE prn at shower level  OT Short Term Goal 3 (Week 1): Pt will recall 3/3 back precautions functionally with min questioning cuing OT Short Term Goal 4 (Week 1): Pt will be able to instruct caregiver how to don brace properly with min questioning cuing OT Short Term Goal 5 (Week 1): Pt will transfer to toilet/ BSC with min A   Skilled Therapeutic Interventions/Progress Updates:    Pt resting in bed with wife at side upon arrival.  Pt stated that he had already cleaned LB and donned new pants prior to therapy session.  Discussed taking shower but patient stated that he didn't want to take a shower if he couldn't take his TLSO off while in shower.  Pt elected to bathe UB and change shirt while supine in bed.  Pt's wife initiated donning TLSO on patient and required min A only for positioning.  Pt required min A for supine->sit and sit stand for stand pivot transfer.  Pt completed grooming tasks seated in w/c before propelling to ADL tub room.  Pt and wife educated on use of tub bench but did not practice transfer.  Pt propelled back to room and requested to return to bed secondary to increased pain and fatigue.  Pt completed transfer with supervision but required mod A for sit->supine.  Focus on family education, donning/doffing TLSO, transfers, w/c mobility, activity tolerance, and safety awareness.  Therapy Documentation Precautions:  Precautions Precautions: Back Precaution Comments: no BAT  Required Braces or Orthoses: Spinal Brace Spinal Brace: Thoracolumbosacral orthotic;Applied in supine position Restrictions Weight  Bearing Restrictions: No   Pain: Pain Assessment Pain Assessment: 0-10 Pain Score: 5  Pain Type: Acute pain Pain Location: Hip Pain Orientation: Right Pain Radiating Towards: leg Pain Descriptors / Indicators: Aching Pain Frequency: Constant Pain Onset: Gradual Patients Stated Pain Goal: 0 Pain Intervention(s): RN made aware ADL: ADL ADL Comments: see FIM See FIM for current functional status  Therapy/Group: Individual Therapy  Leroy Libman 12/07/2013, 11:50 AM

## 2013-12-07 NOTE — Progress Notes (Signed)
PMR Admission Coordinator Pre-Admission Assessment   Patient: Alexander Cox is an 68 y.o., male MRN: [id number] DOB: 12-23-1945 Height: 5\' 7"  (170.2 cm) Weight: 70.9 kg (156 lb 4.9 oz)                                                                                                                                                  Insurance Information HMO:     PPO: yes     PCP:      IPA:      80/20:      OTHER: Medicare advantage plan          PRIMARY: A[employer] Medicare      Policy#: [id number]      Subscriber: pt CM Name: Joaquin Music      Phone#: [phone]     Fax#: [phone] Pre-Cert#: [id number]      Employer: retired to f/u with onsite Gaylan Gerold [phone] Benefits:  Phone #: [phone]     Name: 7/24 Eff. Date: 05/14/13     Deduct: none      Out of Pocket Max: $3200      Life Max: none CIR: $200 copay per day days 1-10       SNF: no copay days 1-20; $75 copay per day days 21-52; no copay days 53-100 Outpatient: 90%     Co-Pay: 10% Home Health: 100%      Co-Pay: none DME: 80%     Co-Pay: 20% Providers: in network  SECONDARY: none       Medicaid Application Date:       Case Manager:  Disability Application Date:       Case Worker:    Emergency Facilities manager Information     Name  Relation  Home  Work  Mobile     Cichy,Pat  Spouse  [phone]    [phone]       Current Medical History  Patient Admitting Diagnosis:Multitrauma with T11 burst fracture as well as spondylolisthesis with right L5 radiculopathy after a fall 11/30/2013   History of Present Illness: Alexander Cox is a 68 y.o. male with history of celiac disease, HTN with recent medication changes with episodic of dizziness and near syncope.    On 11/30/13, he was up 20 feet on a ladder when he developed recurrent dizziness but continued working. He had LOC with fall and onset of lower thoracic pain. In ED he was found to be hypotensive with SBP in 80's and MRI of spine revealed unstable T-11 burst  fracture, non displaced T2 fracture as well as non-displaced fracture of T- 10 posterior elements, L5/S1 anterolisthesis with ligamentous injury and left greater than right low grade paraspinal muscle strain. He was noted to be dehydrated and hypotension improved with IVF. Dr. Annette Stable consulted and recommended immobilization with TLSO as patient without neurologic symptoms. Patient with RLE pain  due to L5 radiculopathy from degenerative spondylolisthesis with nerve root irritation and NS recommends slow mobilization with question of steroids for symptom management v/s eventual decompression. Cardiology recommended holding BP medications and 2D echo done revealing EF 50-55% with grade 1 diastolic dysfunction.    Per Dr. Annette Stable overall stable. Patient may continue to undergo physical and occupational therapy. He is a good candidate for re\re of the location. I do not think any type of operative intervention will be necessary for his T11 fracture. With regard to his foraminal stenosis and L5 radiculopathy at L5-S1 I think the patient should gradually improve with time. If his symptoms become more bothersome we could consider an epidural steroid injection. I do not think that the Rex surgical decompression is necessary at this point.   Past Medical History  Past Medical History   Diagnosis  Date   .  Hypertension     .  High cholesterol     .  Celiac disease     .  Nephrolithiasis         right      Family History  family history includes Hypertension in his paternal grandfather.   Prior Rehab/Hospitalizations: none   Current Medications  Current facility-administered medications:aspirin EC tablet 81 mg, 81 mg, Oral, Daily, Lisette Abu, PA-C, 81 mg at 12/04/13 1013;  calcium carbonate (OS-CAL - dosed in mg of elemental calcium) tablet 500 mg of elemental calcium, 1 tablet, Oral, Q breakfast, Lisette Abu, PA-C, 500 mg of elemental calcium at 12/04/13 0809 cholecalciferol (VITAMIN D)  tablet 1,000 Units, 1,000 Units, Oral, Daily, Lisette Abu, PA-C, 1,000 Units at 12/04/13 1013;  docusate sodium (COLACE) capsule 100 mg, 100 mg, Oral, BID, Lisette Abu, PA-C, 100 mg at 12/04/13 1013;  enoxaparin (LOVENOX) injection 40 mg, 40 mg, Subcutaneous, Q24H, Lisette Abu, PA-C, 40 mg at 12/04/13 1013 HYDROmorphone (DILAUDID) injection 0.5 mg, 0.5 mg, Intravenous, Q4H PRN, Lisette Abu, PA-C, 0.5 mg at 12/01/13 1202;  ondansetron [facility]) injection 4 mg, 4 mg, Intravenous, Q6H PRN, Lisette Abu, PA-C;  ondansetron [facility]) tablet 4 mg, 4 mg, Oral, Q6H PRN, Lisette Abu, PA-C;  oxyCODONE (Oxy IR/ROXICODONE) immediate release tablet 5-15 mg, 5-15 mg, Oral, Q4H PRN, Lisette Abu, PA-C, 10 mg at 12/04/13 1459 pantoprazole (PROTONIX) EC tablet 40 mg, 40 mg, Oral, Daily, Lisette Abu, PA-C, 40 mg at 12/04/13 1013;  polyethylene glycol (MIRALAX / GLYCOLAX) packet 17 g, 17 g, Oral, Daily, Lisette Abu, PA-C, 17 g at 12/04/13 0809;  polyvinyl alcohol (LIQUIFILM TEARS) 1.4 % ophthalmic solution 1 drop, 1 drop, Both Eyes, PRN, Rolm Bookbinder, MD, 1 drop at 12/02/13 0151 simvastatin (ZOCOR) tablet 20 mg, 20 mg, Oral, Daily, Lisette Abu, PA-C, 20 mg at 12/03/13 1020;  tiZANidine (ZANAFLEX) tablet 4 mg, 4 mg, Oral, Q8H PRN, Zenovia Jarred, MD, 4 mg at 12/03/13 2249   Patients Current Diet: Gluten Restricted   Precautions / Restrictions Precautions Precautions: Back Precaution Booklet Issued: Yes (comment) Precaution Comments: pt able to state 2/3 precautions initially; at end could state 3/3; min cues to maintain (especially no twisting) Spinal Brace: Thoracolumbosacral orthotic;Applied in supine position Restrictions Weight Bearing Restrictions: No    Prior Activity Level Home Assistive Devices / Equipment Home Assistive Devices/Equipment: None Home Equipment: Walker - 4 wheels;Other (comment) (was his mother's)   Prior Functional  Level Prior Function Level of Independence: Independent Independent and retired. 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 Unable to sit EOB without support of BUE Upper Body Bathing: Minimal assitance;Sitting Lower Body Bathing: Maximal assistance;Sit to/from stand Upper Body Dressing : Moderate assistance;Sitting Lower Body Dressing: Maximal assistance;Sit to/from stand Toilet Transfer: +2 for physical assistance;Moderate assistance Toileting- Clothing Manipulation and Hygiene: Maximal assistance;Sit to/from stand (to follow back precautions with TLSO) Functional mobility during ADLs: +2 for physical assistance;Moderate assistance General ADL Comments: Began education on back precautions and ADL. TLSO donned in supine      Mobility   Overal bed mobility: Needs Assistance Bed Mobility: Rolling;Sidelying to Sit Rolling: Min assist (with rail) Sidelying to sit: Mod assist General bed mobility comments: rolled Lt for donning brace with vc for technique to prevent twisting and minimize pain; wife present and observed donning brace with verbal instructions given; vc and physical assist for roll and side to sit      Transfers   Overall transfer level: Needs assistance Equipment used: Rolling walker (2 wheeled) Transfers: Sit to/from Stand Sit to Stand: Mod assist;From elevated surface (bed elevated to simulate home) General transfer comment: VCs for hand placement and safety, patient with significant pain during transitional movement, assist to elevate to standing and to provide support in  standing      Ambulation / Gait / Stairs / Wheelchair Mobility   Ambulation/Gait Ambulation/Gait assistance: Min assist;+2 safety/equipment Ambulation Distance (Feet): 25 Feet Assistive device: Rolling walker (2 wheeled) Gait Pattern/deviations: Step-to pattern;Decreased weight shift to right;Antalgic;Wide base of support Gait velocity: decreased Gait velocity interpretation: <1.8 ft/sec, indicative of risk for recurrent falls General Gait Details: Pt continues with primary complaint of pain in Rt hip area and denies pain down into RLE; pt externally rotates RLE with limited weight bearing due to pain; heavy reliance on UE support on RW      Posture / Balance  Dynamic Sitting Balance Sitting balance - Comments: could not take hands off bed and maintain balance      Special needs/care consideration  Bowel mgmt: continent Bladder mgmt: continent       Previous Home Environment Living Arrangements: Spouse/significant other  Lives With: Spouse Available Help at Discharge: Family Type of Home: House Home Layout: One level Home Access: Stairs to enter Entrance Stairs-Rails: None Technical brewer of Steps: 5 Bathroom Shower/Tub: Multimedia programmer: Standard Bathroom Accessibility: Yes How Accessible: Accessible via walker Home Care Services: No Additional Comments: tub shower with curtain, standard height toliets   Discharge Living Setting Plans for Discharge Living Setting: Patient's home;Lives with (comment);Other (Comment) (spouse) Type of Home at Discharge: House Discharge Home Layout: One level Discharge Home Access: Stairs to enter Entrance Stairs-Rails: None Entrance Stairs-Number of Steps: 5 Discharge Bathroom Shower/Tub: Walk-in shower Discharge Bathroom Toilet: Standard Discharge Bathroom Accessibility: Yes How Accessible: Accessible via walker Does the patient have any problems obtaining your medications?: No   Social/Family/Support  Systems Patient Roles: Spouse;Parent Contact Information: Myna Hidalgo, spouse Anticipated Caregiver: spouse Anticipated Caregiver's Contact Information: see above Discharge Plan Discussed with Primary Caregiver: Yes Is Caregiver In Agreement with Plan?: Yes Does Caregiver/Family have Issues with Lodging/Transportation while Pt is in Rehab?: No Wife retired.  Goals/Additional Needs Patient/Family Goal for Rehab: Mod I with PT and OT Expected length of stay: ELOS 7 days Pt/Family Agrees to Admission and willing to participate: Yes Program Orientation Provided & Reviewed with Pt/Caregiver Including Roles  & Responsibilities: Yes   Decrease burden of Care through IP rehab admission: n/a   Possible need for SNF placement upon discharge:n/a   Patient Condition: This patient's condition remains as documented in the consult dated 12/03/2013, in which the Rehabilitation Physician determined and documented that the patient's condition is appropriate for intensive rehabilitative care in an inpatient rehabilitation facility. Will admit to inpatient rehab today.   Preadmission Screen Completed By:  Cleatrice Burke, 12/04/2013 3:47 PM ______________________________________________________________________    Discussed status with Dr. Naaman Plummer on 12/04/13 at  1547 and received telephone approval for admission today.   Admission Coordinator:  Cleatrice Burke, time 0932 Date 12/04/2013.          Cosigned by: Meredith Staggers, MD    [12/04/2013 3:58 PM]  Revision History...     Date/Time User Action   12/04/2013 3:58 PM Meredith Staggers, MD Cosign   12/04/2013 3:47 PM Cleatrice Burke, RN Sign   12/04/2013 3:47 PM Cleatrice Burke, RN Sign  View Details Report

## 2013-12-07 NOTE — Progress Notes (Signed)
Subjective/Complaints: Right leg still causing pain---affects sleep.  Review of Systems - Negative except as above, bowels moved Objective: Vital Signs: Blood pressure 128/65, pulse 80, temperature 98.6 F (37 C), temperature source Oral, resp. rate 18, weight 70.9 kg (156 lb 4.9 oz), SpO2 97.00%. No results found. Results for orders placed during the hospital encounter of 12/04/13 (from the past 72 hour(s))  CBC WITH DIFFERENTIAL     Status: Abnormal   Collection Time    12/07/13  6:47 AM      Result Value Ref Range   WBC 7.0  4.0 - 10.5 K/uL   RBC 4.05 (*) 4.22 - 5.81 MIL/uL   Hemoglobin 12.4 (*) 13.0 - 17.0 g/dL   HCT 37.7 (*) 39.0 - 52.0 %   MCV 93.1  78.0 - 100.0 fL   MCH 30.6  26.0 - 34.0 pg   MCHC 32.9  30.0 - 36.0 g/dL   RDW 12.2  11.5 - 15.5 %   Platelets 217  150 - 400 K/uL   Neutrophils Relative % 68  43 - 77 %   Neutro Abs 4.7  1.7 - 7.7 K/uL   Lymphocytes Relative 20  12 - 46 %   Lymphs Abs 1.4  0.7 - 4.0 K/uL   Monocytes Relative 9  3 - 12 %   Monocytes Absolute 0.6  0.1 - 1.0 K/uL   Eosinophils Relative 3  0 - 5 %   Eosinophils Absolute 0.2  0.0 - 0.7 K/uL   Basophils Relative 0  0 - 1 %   Basophils Absolute 0.0  0.0 - 0.1 K/uL  COMPREHENSIVE METABOLIC PANEL     Status: Abnormal   Collection Time    12/07/13  6:47 AM      Result Value Ref Range   Sodium 138  137 - 147 mEq/L   Potassium 3.9  3.7 - 5.3 mEq/L   Chloride 99  96 - 112 mEq/L   CO2 27  19 - 32 mEq/L   Glucose, Bld 90  70 - 99 mg/dL   BUN 23  6 - 23 mg/dL   Creatinine, Ser 1.31  0.50 - 1.35 mg/dL   Calcium 8.8  8.4 - 10.5 mg/dL   Total Protein 6.5  6.0 - 8.3 g/dL   Albumin 3.2 (*) 3.5 - 5.2 g/dL   AST 41 (*) 0 - 37 U/L   ALT 55 (*) 0 - 53 U/L   Alkaline Phosphatase 164 (*) 39 - 117 U/L   Total Bilirubin 0.8  0.3 - 1.2 mg/dL   GFR calc non Af Amer 55 (*) >90 mL/min   GFR calc Af Amer 63 (*) >90 mL/min   Comment: (NOTE)     The eGFR has been calculated using the CKD EPI equation.     This  calculation has not been validated in all clinical situations.     eGFR's persistently <90 mL/min signify possible Chronic Kidney     Disease.   Anion gap 12  5 - 15     HEENT: normal Cardio: RRR and no murmur Resp: CTA B/L and unlabored GI: BS positive and NT,ND Extremity:  No Edema Skin:   Intact Neuro: Alert/Oriented and Cranial Nerve II-XII normal Musc/Skel:  Other no tenderness over R hip, no pain with hip ROM, - SLR Gen NAD Motor strength is 5/5 bilateral deltoid, bicep, tricep, grip  3/5 in the right hip flexor knee extensor 4-/5 right ankle dorsiflexor plantar flexor  4/5 left hip flexor 4/5  knee extensor 5/5 ankle dorsiflexor plantar flexor    Assessment/Plan: 1. Functional deficits secondary to T 11 burst fx, R L4/5 Radic which require 3+ hours per day of interdisciplinary therapy in a comprehensive inpatient rehab setting. Physiatrist is providing close team supervision and 24 hour management of active medical problems listed below. Physiatrist and rehab team continue to assess barriers to discharge/monitor patient progress toward functional and medical goals.  FIM: FIM - Bathing Bathing Steps Patient Completed: Chest;Right Arm;Left Arm;Abdomen;Front perineal area Bathing: 3: Mod-Patient completes 5-7 64f10 parts or 50-74%  FIM - Upper Body Dressing/Undressing Upper body dressing/undressing steps patient completed: Thread/unthread left sleeve of pullover shirt/dress;Thread/unthread right sleeve of pullover shirt/dresss;Put head through opening of pull over shirt/dress Upper body dressing/undressing: 4: Min-Patient completed 75 plus % of tasks FIM - Lower Body Dressing/Undressing Lower body dressing/undressing: 1: Total-Patient completed less than 25% of tasks  FIM - TMusicianDevices: Grab bar or rail for support Toileting: 1: Total-Patient completed zero steps, helper did all 3  FIM - TRadio producerDevices: Bedside  commode;Walker;Grab bars Toilet Transfers: 4-To toilet/BSC: Min A (steadying Pt. > 75%);4-From toilet/BSC: Min A (steadying Pt. > 75%)  FIM - Bed/Chair Transfer Bed/Chair Transfer Assistive Devices: Arm rests;Bed rails;Walker Bed/Chair Transfer: 3: Bed > Chair or W/C: Mod A (lift or lower assist);3: Chair or W/C > Bed: Mod A (lift or lower assist);4: Supine > Sit: Min A (steadying Pt. > 75%/lift 1 leg)  FIM - Locomotion: Wheelchair Distance: 50 ft (limited by pain) Locomotion: Wheelchair: 0: Activity did not occur FIM - Locomotion: Ambulation Locomotion: Ambulation Assistive Devices: WAdministratorAmbulation/Gait Assistance: 4: Min assist Locomotion: Ambulation: 1: Travels less than 50 ft with moderate assistance (Pt: 50 - 74%) (w/c follow for safety)  Comprehension Comprehension Mode: Auditory Comprehension: 6-Follows complex conversation/direction: With extra time/assistive device  Expression Expression Mode: Verbal Expression: 6-Expresses complex ideas: With extra time/assistive device  Social Interaction Social Interaction: 6-Interacts appropriately with others with medication or extra time (anti-anxiety, antidepressant).  Problem Solving Problem Solving: 5-Solves basic problems: With no assist  Memory Memory: 6-More than reasonable amt of time  Medical Problem List and Plan:  1. Functional deficits secondary to Multitrauma with T11 burst fracture as well as spondylolisthesis with right L5 radiculopathy after a fall 11/30/2013  -Apply TLSO in supine  2. DVT Prophylaxis/Anticoagulation: Pharmaceutical: Lovenox  3. Pain Management: Will add oxycontin for more consistent pain management with flexeril for muscle spasms. Continue prn oxycodone.   -will slightly increase gabapentin 4. Mood: Seems to be upbeat. Will have LCSW follow for evaluation and support.  5. Neuropsych: This patient is capable of making decisions on his own behalf.  6. Skin/Wound Care: No problems noted.  Only mild abrasion to left elbow Continue to monitor for now.  7. ABLA: Due to trauma--hgb 13.9--->12.4. Still 12.4 8. Acute on chronic renal insufficiency: Continue to encourage po fluid intake--BUN up to 31 indicating dehydration. Cr improved off HCTZ and Ace 2.02--->1.78.  9. HTN: Monitor BP for recurrent orthostatic changes/dizziness. Allow some degree of hypertension.  10. Constipation: Ebowels moved this am cont current Rx Miralax   LOS (Days) 3 A FACE TO FACE EVALUATION WAS PERFORMED  SWARTZ,ZACHARY T 12/07/2013, 8:45 AM

## 2013-12-07 NOTE — Progress Notes (Signed)
Physical Therapy Session Note  Patient Details  Name: Alexander Cox MRN: [id number] Date of Birth: 1946-01-20  Today's Date: 12/07/2013 Time: 4801-6553 Time Calculation (min): 63 min  Short Term Goals: Week 1:  PT Short Term Goal 1 (Week 1): Pt will perform bed mobility with min A, HOB flat and no rail.  PT Short Term Goal 2 (Week 1): Pt will perform bed <> w/c transfers with supervision.  PT Short Term Goal 3 (Week 1): Pt will ambulate 30 ft using RW and min A.  PT Short Term Goal 4 (Week 1): Pt will negotiate up/down 5 stairs using 2 rails and min A.  PT Short Term Goal 5 (Week 1): Pt will propel w/c x 150 ft with supervision.  Skilled Therapeutic Interventions/Progress Updates:  Pt. Received supine in bed with HOB elevated. Wife present. Pt. Agreeable to physical therapy session. TLSO donned supine in bed. Wife assisted and participated in demonstration/education of proper donning. Pt. Stated pain 3/10 elevating to 5/10 while supine with activity pre-session; RN present and administered pain medications prior to start of therapy.  1:1 Treatment focused on functional transfers and R LE stretching. Pt. Assist level is min A secondary to pain with TLSO and RW for safety and additional support. Pt. Reported in creased pain and discomfort upon standing or seated EOB. Pain continued seated in w/c and cushion replaced with flatter/firmer support. Pt. Performed multiple transfers sit<>stand with RW and min A; supine>sit utilizing bed-rails with min-mod A; max A for donning footwear due to BAT precautions. Pt. Required multiple therapeutic rest/recovery breaks to allow pain to subside. Therapist performed L LE stretching of hip and mild nerve glides to decrease muscle tension and assist in alleviating radicular (down to calf) pain into centralizing mid lumbar region.  Pt. Responded favorably to manual stretching and being provided firmer/flatter support for pelvis and back. Pt. Indicated reduced pain  and displayed a more natural facial expression; unrated.  Pain: see below  Pt. Condition post therapy session: positioned seated in w/c with all needs within reach. Physical therapist and wife present.  Therapy Documentation Precautions:  Precautions Precautions: Back Precaution Comments: no BAT  Required Braces or Orthoses: Spinal Brace Spinal Brace: Thoracolumbosacral orthotic;Applied in supine position Restrictions Weight Bearing Restrictions: No  Pain: Pain Assessment Pain Assessment: 0-10 Pain Score: 8  Pain Type: Acute pain Pain Location: Hip Pain Orientation: Right Pain Descriptors / Indicators: Aching Pain Frequency: Constant Pain Onset: On-going Patients Stated Pain Goal: 3 Pain Intervention(s): Medication (See eMAR);Repositioned;Emotional support Multiple Pain Sites: No  See FIM for current functional status  Therapy/Group: Individual Therapy  Juluis Mire 12/07/2013, 4:36 PM

## 2013-12-07 NOTE — Progress Notes (Signed)
Physical Therapy Session Note  Patient Details  Name: Alexander Cox MRN: [id number] Date of Birth: 1945/10/01  Today's Date: 12/07/2013 Time: 6659-9357 and 0177-9390 Time Calculation (min): 88 min and 29 min  Short Term Goals: Week 1:  PT Short Term Goal 1 (Week 1): Pt will perform bed mobility with min A, HOB flat and no rail.  PT Short Term Goal 2 (Week 1): Pt will perform bed <> w/c transfers with supervision.  PT Short Term Goal 3 (Week 1): Pt will ambulate 30 ft using RW and min A.  PT Short Term Goal 4 (Week 1): Pt will negotiate up/down 5 stairs using 2 rails and min A.  PT Short Term Goal 5 (Week 1): Pt will propel w/c x 150 ft with supervision.  Skilled Therapeutic Interventions/Progress Updates:    First session: Patient received supine in bed. Session focused on increasing activity tolerance with bed mobility, functional transfers, gait training, stair negotiation, and functional strengthening. Patient's wife donned TLSO in supine without cues from therapist. Patient performing sit<>supine with minA overall, stand pivot transfers with RW and minA progressing to supervision. Wheelchair mobility x160' with B LE for strengthening. Gait training 38' x1 with RW and min guard progressing to close supervision, cues for increasing L step length. Stair negotiation x5 steps with B handrails and minA, cues for proper sequencing. Repeated sit<>stands with emphasis on proper hand placement and controlled stand>sit for improved B eccentric quad control. Furniture transfers on low, cushioned couches in ADL apartment and family room with minA. Patient returned to room and left semi-reclined in bed, wife doffing TLSO.  Second session: Patient received supine in bed. Session focused on functional transfers and functional strengthening. Patient's wife donned TLSO in supine without cues from therapist. Patient performing sit<>supine with minA for R LE management. Patient in [place]: performed mini squats  with varying levels of UE assist. First round with B hands on bar, second round with B hand holding with therapist, third round with B hand holding with therapist biasing UE placement (UEs to the R or above) to facilitate increased weight bearing through R LE and decrease use of UEs. Patient left semi-reclined in bed with all needs within reach and wife present.  Therapy Documentation Precautions:  Precautions Precautions: Back Precaution Comments: no BAT  Required Braces or Orthoses: Spinal Brace Spinal Brace: Thoracolumbosacral orthotic;Applied in supine position Restrictions Weight Bearing Restrictions: No Pain: Pain Assessment Pain Assessment: 0-10 Pain Score: 6  Pain Type: Acute pain Pain Location: Hip Pain Orientation: Right Pain Radiating Towards: leg Pain Descriptors / Indicators: Aching Pain Frequency: Constant Pain Onset: On-going Patients Stated Pain Goal: 3 Pain Intervention(s): Medication (See eMAR);Repositioned;Emotional support Multiple Pain Sites: No Locomotion : Ambulation Ambulation/Gait Assistance: 5: Supervision;4: Min guard Wheelchair Mobility Distance: 160   See FIM for current functional status  Therapy/Group: Individual Therapy  Lillia Abed. Avia Merkley, PT, DPT 12/07/2013, 3:01 PM

## 2013-12-07 NOTE — Progress Notes (Signed)
Social Work Assessment and Plan  Patient Details  Name: Alexander Cox MRN: [id number] Date of Birth: Jun 10, 1945  Today's Date: 12/07/2013  Problem List:  Patient Active Problem List   Diagnosis Date Noted  . Traumatic burst fracture of thoracic vertebra 12/04/2013  . Dehydration 12/01/2013  . Multiple fractures of ribs of left side 12/01/2013  . HTN (hypertension) 12/01/2013  . Hyperlipidemia 12/01/2013  . Acute renal insufficiency 12/01/2013  . T11 vertebral fracture 11/30/2013  . Hypotension 11/30/2013  . Syncope 11/30/2013   Past Medical History:  Past Medical History  Diagnosis Date  . Hypertension   . High cholesterol   . Celiac disease   . Nephrolithiasis     right   Past Surgical History: No past surgical history on file. Social History:  reports that he has quit smoking. He uses smokeless tobacco. He reports that he does not drink alcohol or use illicit drugs.  Family / Support Systems Marital Status: Married How Long?: 9 years Patient Roles: Spouse;Parent;Other (Comment) (handyman) Spouse/Significant Other: Merrill Villarruel - wife  [phone] (h) [phone] (c) Children: 2 sons and 2 daughters-in-law Other Supports: family - 3 grandchildren Anticipated Caregiver: spouse Ability/Limitations of Caregiver: none Caregiver Availability: 24/7 Family Dynamics: close, supportive wife.  sons are helpful/supportive too.  Social History Preferred language: English Religion: Catholic Cultural Background: Catholic Education: some college Read: Yes Write: Yes Employment Status: Retired Date Retired/Disabled/Unemployed: 2010 Age Retired: 41 Public relations account executive Issues: None reported Guardian/Conservator: N/A   Abuse/Neglect Physical Abuse: Denies Verbal Abuse: Denies Sexual Abuse: Denies Exploitation of patient/patient's resources: Denies Self-Neglect: Denies  Emotional Status Pt's affect, behavior and adjustment status: Pt was tired during assessment, but  smiled often and answered questions with good eye contact.  He is taking things as they come.  Wife is supportive and positive. Recent Psychosocial Issues: None reported Psychiatric History: None reported Substance Abuse History: None reported  Patient / Family Perceptions, Expectations & Goals Pt/Family understanding of illness & functional limitations: Pt and wife report good understanding of pt's condition and limitations.  They feel their medical questions have been answered. 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  Enjoys hunting, fishing, golfing, working in the yard, Social research officer, government. Anticipated changes in roles/activities/participation: Pt's sons will care for the yard and wife can take care of inside of house. Pt/family expectations/goals: Pt just wants to be free of pain and able to be independent again.  Community Resources [employer] Scripts: None Premorbid Home Care/DME Agencies: None Transportation available at discharge: wife  Discharge Planning Living Arrangements: Spouse/significant other Support Systems: Children;Other relatives Type of Residence: Private residence Insurance Resources: Multimedia programmer (specify) Retail buyer) Museum/gallery curator Resources: Fish farm manager;Other (Comment) (retirement ) Financial Screen Referred: No Money Management: Spouse Does the patient have any problems obtaining your medications?: No Home Management: Pt and wife share these responsibilities.  Wife can keep things going while pt recovers. Patient/Family Preliminary Plans: Pt will go home to the care of his wife with support from his sons as needed. Barriers to Discharge: Steps Social Work Anticipated Follow Up Needs: HH/OP Expected length of stay: 10 to 12 days  Clinical Impression CSW met with pt and his wife to introduce self and role of CSW, as well as complete assessment.  Pt and wife were very appreciative of CSW's visit and  they feel things are going well with pt's rehab.  They are pleased with the care they have received thus far.  Pt will return to his home  with support from his wife.  Pt is used to being very independent and wants to get back to a place where he can do things for himself.  He reports feeling frustrated about the fall and subsequent hospitalization/rehabilitation, but is otherwise doing well emotionally.  CSW will continue to check in with pt. Pt's wife is present for therapies during the day, but goes home at night to get good rest.  CSW will continue to follow and assist with d/c needs.  Pershing Skidmore, Silvestre Mesi 12/07/2013, 1:31 PM

## 2013-12-07 NOTE — IPOC Note (Signed)
Overall Plan of Care [facility]) Patient Details Name: SCOTTI KOSTA MRN: [id number] DOB: [birth year]  Admitting Diagnosis: t11 fx  Hospital Problems: Active Problems:   Traumatic burst fracture of thoracic vertebra     Functional Problem List: Nursing Bowel;Endurance;Medication Management;Motor;Pain;Safety;Skin Integrity  PT Balance;Endurance;Motor;Pain;Perception;Sensory  OT Balance;Endurance;Motor;Pain;Safety  SLP    TR         Basic ADL's: OT Grooming;Bathing;Dressing;Toileting     Advanced  ADL's: OT Simple Meal Preparation     Transfers: PT Bed Mobility;Bed to Chair;Car;Furniture  OT Toilet;Tub/Shower     Locomotion: PT Ambulation;Wheelchair Mobility;Stairs     Additional Impairments: OT None  SLP        TR      Anticipated Outcomes Item Anticipated Outcome  Self Feeding no goal  Swallowing      Basic self-care  supervision   Toileting  mod I    Bathroom Transfers mod I to toilet - supervision to shower  Bowel/Bladder  Bowel/bladder with minimal assistance  Transfers  mod I  Locomotion  mod I to supervision  Communication     Cognition     Pain  Pain level 5 or less on a scale of 0-10  Safety/Judgment  Safety/judement with minimal assistance.   Therapy Plan: PT Intensity: Minimum of 1-2 x/day ,45 to 90 minutes PT Frequency: 5 out of 7 days PT Duration Estimated Length of Stay: 10-12 days OT Intensity: Minimum of 1-2 x/day, 45 to 90 minutes OT Frequency: 5 out of 7 days OT Duration/Estimated Length of Stay: 10-12 days         Team Interventions: Nursing Interventions Patient/Family Education;Bowel Management;Disease Management/Prevention;Pain Management;Medication Management;Skin Care/Wound Management;Discharge Planning;Psychosocial Support  PT interventions Ambulation/gait training;Balance/vestibular training;Discharge planning;DME/adaptive equipment instruction;Functional mobility training;Neuromuscular re-education;Pain  management;Patient/family education;Stair training;Therapeutic Activities;Therapeutic Exercise;UE/LE Strength taining/ROM;UE/LE Coordination activities;Wheelchair propulsion/positioning  OT Interventions Journalist, newspaper;Discharge planning;Pain management;Self Care/advanced ADL retraining;Therapeutic Activities;Therapeutic Exercise;UE/LE Strength taining/ROM;Psychosocial support;Functional mobility training;Patient/family education;Wheelchair propulsion/positioning  SLP Interventions    TR Interventions    SW/CM Interventions Discharge Planning;Psychosocial Support;Patient/Family Education    Team Discharge Planning: Destination: PT-Home ,OT- Home , SLP-  Projected Follow-up: PT-Home health PT, OT-  Home health OT, SLP-  Projected Equipment Needs: PT-Wheelchair (measurements);Wheelchair cushion (measurements);Rolling walker with 5" wheels, OT- Tub/shower seat;Tub/shower bench;3 in 1 bedside comode, SLP-  Equipment Details: PT-anticipate needs above, TBD upon d/c, OT-  Patient/family involved in discharge planning: PT- Patient,  OT-Patient;Family member/caregiver, SLP-   MD ELOS: 10-12 days Medical Rehab Prognosis:  Excellent Assessment: The patient has been admitted for CIR therapies with the diagnosis of spinal fractures, lumbar radiculopathy after fall. The team will be addressing functional mobility, strength, stamina, balance, safety, adaptive techniques and equipment, self-care, bowel and bladder mgt, patient and caregiver education, pain mgt, back precautions, brace wear, egosupport, community reintegration. Goals have been set at mod I for basic mobility, and mod I to supervision for self-care. Pain has been a major factor especially due to the radiculopathy.    Meredith Staggers, MD, FAAPMR      See Team Conference Notes for weekly updates to the plan of care

## 2013-12-07 NOTE — Progress Notes (Signed)
[facility] Individual Statement of Services  Patient Name:  Alexander Cox  Date:  12/07/2013  Welcome to the [facility].  Our goal is to provide you with an individualized program based on your diagnosis and situation, designed to meet your specific needs.  With this comprehensive rehabilitation program, you will be expected to participate in at least 3 hours of rehabilitation therapies Monday-Friday, with modified therapy programming on the weekends.  Your rehabilitation program will include the following services:  Physical Therapy (PT), Occupational Therapy (OT), 24 hour per day rehabilitation nursing, Case Management (Social Worker), Rehabilitation Medicine, Nutrition Services and Pharmacy Services  Weekly team conferences will be held on Tuesdays to discuss your progress.  Your Social Worker will talk with you frequently to get your input and to update you on team discussions.  Team conferences with you and your family in attendance may also be held.  Expected length of stay:  10 to 12 days  Overall anticipated outcome:  Modified Independent with supervision for bathing/dressing and assistance with donning brace  Depending on your progress and recovery, your program may change. Your Social Worker will coordinate services and will keep you informed of any changes. Your Social Worker's name and contact numbers are listed  below.  The following services may also be recommended but are not provided by the [facility] will be made to provide these services after discharge if needed.  Arrangements include referral to agencies that provide these services.  Your insurance has been verified to be:  [employer] Your primary doctor is:  Dr. Serita Grammes  Pertinent information will be shared with your  doctor and your insurance company.  Social Worker:  Alfonse Alpers, LCSW  [phone] or [phone]  Information discussed with and copy given to patient by: Trey Sailors, 12/07/2013, 1:37 PM

## 2013-12-08 ENCOUNTER — Encounter (HOSPITAL_COMMUNITY): Payer: Self-pay | Admitting: Occupational Therapy

## 2013-12-08 ENCOUNTER — Inpatient Hospital Stay (HOSPITAL_COMMUNITY): Payer: Medicare Other | Admitting: *Deleted

## 2013-12-08 ENCOUNTER — Inpatient Hospital Stay (HOSPITAL_COMMUNITY): Payer: Medicare Other

## 2013-12-08 ENCOUNTER — Inpatient Hospital Stay (HOSPITAL_COMMUNITY): Payer: Self-pay | Admitting: Occupational Therapy

## 2013-12-08 ENCOUNTER — Inpatient Hospital Stay (HOSPITAL_COMMUNITY): Payer: Medicare Other | Admitting: Occupational Therapy

## 2013-12-08 DIAGNOSIS — I959 Hypotension, unspecified: Secondary | ICD-10-CM

## 2013-12-08 DIAGNOSIS — Z5189 Encounter for other specified aftercare: Secondary | ICD-10-CM

## 2013-12-08 DIAGNOSIS — IMO0002 Reserved for concepts with insufficient information to code with codable children: Secondary | ICD-10-CM

## 2013-12-08 DIAGNOSIS — S22009A Unspecified fracture of unspecified thoracic vertebra, initial encounter for closed fracture: Secondary | ICD-10-CM

## 2013-12-08 DIAGNOSIS — N289 Disorder of kidney and ureter, unspecified: Secondary | ICD-10-CM

## 2013-12-08 DIAGNOSIS — W19XXXA Unspecified fall, initial encounter: Secondary | ICD-10-CM

## 2013-12-08 DIAGNOSIS — S2249XA Multiple fractures of ribs, unspecified side, initial encounter for closed fracture: Secondary | ICD-10-CM

## 2013-12-08 MED ORDER — GABAPENTIN 100 MG PO CAPS
200.0000 mg | ORAL_CAPSULE | Freq: Three times a day (TID) | ORAL | Status: DC
  Administered 2013-12-08 (×2): 200 mg via ORAL
  Filled 2013-12-08 (×6): qty 2

## 2013-12-08 MED ORDER — OXYCODONE HCL ER 10 MG PO T12A
20.0000 mg | EXTENDED_RELEASE_TABLET | Freq: Two times a day (BID) | ORAL | Status: DC
  Administered 2013-12-08 – 2013-12-10 (×4): 20 mg via ORAL
  Filled 2013-12-08 (×4): qty 2

## 2013-12-08 NOTE — Progress Notes (Signed)
Occupational Therapy Session Notes  Patient Details  Name: Alexander Cox MRN: [id number] Date of Birth: [birth year]  Today's Date: 12/08/2013  Short Term Goals: Week 1:  OT Short Term Goal 1 (Week 1): Pt will tolerate bathing at shower level with TSLO on for 15 min  OT Short Term Goal 2 (Week 1): Pt will bathe with min A with AE prn at shower level  OT Short Term Goal 3 (Week 1): Pt will recall 3/3 back precautions functionally with min questioning cuing OT Short Term Goal 4 (Week 1): Pt will be able to instruct caregiver how to don brace properly with min questioning cuing OT Short Term Goal 5 (Week 1): Pt will transfer to toilet/ BSC with min A   Skilled Therapeutic Interventions/Progress Updates:   Session #1 [phone] - 60 Minutes Individual Therapy Patient with 3/10 complaints of pain in right hip, RN aware and present for medication management Therapist reviewed TLSO wearing orders with patient and wrote them on safety plan for other staff members. Patient with decreased memory regarding orders and therapist educated patient on importance of donning/doffing TLSO in supine. Therapist also reviewed back precautions with patient and patient independently stated 3/3 precautions. Therapist set-up basin and patient completed UB bathing & peri cleansing supine in bed, then UB dressing supine in bed secondary to TLSO wearing orders. Patient's wife present and assisted with donning of TLSO, patient required total assist for donning of TLSO. Patient engaged in bed mobility and sat EOB for LB dressing. Patient used AE, reacher & sock aid, to increase independence with this task. Patient stood to pull pants > waist with min assist from therapist. Patient then transferred EOB>w/c with min assist. Patient took rest break, then stood at sink for grooming task of brushing teeth; patient required steady assist during standing secondary to decreased dynamic standing balance. Patient then performed 3 sets of  10X w/c pushups. Patient with increased fatigue/sleepiness during therapy session, patient stated he didn't sleep well last night due to pain. At end of session, left patient seated in w/c with all needs within reach and wife present.   *Recommending tub transfer bench and BSC for home use, discussed this with patient and wife. Wife stated they have a rollator at home and therapist asked wife to bring in rollator for practice with therapies.   Session #2 [id number] - 30 Minutes Individual Therapy Patient with 3-7/10 complaints of pain, 3 at beginning of session - 7 during standing - 4 after standing; RN aware Patient received supine in bed with TLSO donned. Patient engaged in bed mobility and sat EOB for LB dressing of donning bilateral shoes. Patient stood with RW and ambulated into bathroom for focus on functional ambulation & toilet transfer. Patient ambulated back to w/c and propelled self from room > therapy gym. Once in gym, therapeutic activity focusing on sit<>stands, dynamic standing balance/tolerance/endurance, pain management, and overall activity tolerance/endurance. See above for pain ratings during session. For activity, patient performed pipe tree activity. Patient's wife present during entire session and provided support prn. Therapist propelled patient back to room and patient transferred w/c>EOB>supine. Left patient supine in bed with wife assisting to doff TLSO, all needs within reach.   Precautions:  Precautions Precautions: Back Precaution Comments: no BAT  Required Braces or Orthoses: Spinal Brace Spinal Brace: Thoracolumbosacral orthotic;Applied in supine position Restrictions Weight Bearing Restrictions: No  See FIM for current functional status  Tiernan Suto 12/08/2013, 7:16 AM

## 2013-12-08 NOTE — Progress Notes (Signed)
Subjective/Complaints: Still didn't sleep well due to pain. Feels therapy went fairly well yesterday.   Review of Systems - Negative except as above, bowels moved Objective: Vital Signs: Blood pressure 126/66, pulse 70, temperature 98.6 F (37 C), temperature source Oral, resp. rate 18, weight 70.9 kg (156 lb 4.9 oz), SpO2 94.00%. No results found. Results for orders placed during the hospital encounter of 12/04/13 (from the past 72 hour(s))  CBC WITH DIFFERENTIAL     Status: Abnormal   Collection Time    12/07/13  6:47 AM      Result Value Ref Range   WBC 7.0  4.0 - 10.5 K/uL   RBC 4.05 (*) 4.22 - 5.81 MIL/uL   Hemoglobin 12.4 (*) 13.0 - 17.0 g/dL   HCT 37.7 (*) 39.0 - 52.0 %   MCV 93.1  78.0 - 100.0 fL   MCH 30.6  26.0 - 34.0 pg   MCHC 32.9  30.0 - 36.0 g/dL   RDW 12.2  11.5 - 15.5 %   Platelets 217  150 - 400 K/uL   Neutrophils Relative % 68  43 - 77 %   Neutro Abs 4.7  1.7 - 7.7 K/uL   Lymphocytes Relative 20  12 - 46 %   Lymphs Abs 1.4  0.7 - 4.0 K/uL   Monocytes Relative 9  3 - 12 %   Monocytes Absolute 0.6  0.1 - 1.0 K/uL   Eosinophils Relative 3  0 - 5 %   Eosinophils Absolute 0.2  0.0 - 0.7 K/uL   Basophils Relative 0  0 - 1 %   Basophils Absolute 0.0  0.0 - 0.1 K/uL  COMPREHENSIVE METABOLIC PANEL     Status: Abnormal   Collection Time    12/07/13  6:47 AM      Result Value Ref Range   Sodium 138  137 - 147 mEq/L   Potassium 3.9  3.7 - 5.3 mEq/L   Chloride 99  96 - 112 mEq/L   CO2 27  19 - 32 mEq/L   Glucose, Bld 90  70 - 99 mg/dL   BUN 23  6 - 23 mg/dL   Creatinine, Ser 1.31  0.50 - 1.35 mg/dL   Calcium 8.8  8.4 - 10.5 mg/dL   Total Protein 6.5  6.0 - 8.3 g/dL   Albumin 3.2 (*) 3.5 - 5.2 g/dL   AST 41 (*) 0 - 37 U/L   ALT 55 (*) 0 - 53 U/L   Alkaline Phosphatase 164 (*) 39 - 117 U/L   Total Bilirubin 0.8  0.3 - 1.2 mg/dL   GFR calc non Af Amer 55 (*) >90 mL/min   GFR calc Af Amer 63 (*) >90 mL/min   Comment: (NOTE)     The eGFR has been calculated using  the CKD EPI equation.     This calculation has not been validated in all clinical situations.     eGFR's persistently <90 mL/min signify possible Chronic Kidney     Disease.   Anion gap 12  5 - 15     HEENT: normal Cardio: RRR and no murmur Resp: CTA B/L and unlabored GI: BS positive and NT,ND Extremity:  No Edema Skin:   Intact Neuro: Alert/Oriented and Cranial Nerve II-XII normal Musc/Skel:  Other no tenderness over R hip, no pain with hip ROM, - SLR Gen NAD Motor strength is 5/5 bilateral deltoid, bicep, tricep, grip  3/5 in the right hip flexor knee extensor 4-/5 right ankle dorsiflexor  plantar flexor  4/5 left hip flexor 4/5 knee extensor 5/5 ankle dorsiflexor plantar flexor    Assessment/Plan: 1. Functional deficits secondary to T 11 burst fx, R L4/5 Radic which require 3+ hours per day of interdisciplinary therapy in a comprehensive inpatient rehab setting. Physiatrist is providing close team supervision and 24 hour management of active medical problems listed below. Physiatrist and rehab team continue to assess barriers to discharge/monitor patient progress toward functional and medical goals.  FIM: FIM - Bathing Bathing Steps Patient Completed: Chest;Right Arm;Left Arm;Abdomen (pt declined LB bathing (already completed)) Bathing: 3: Mod-Patient completes 5-7 31f10 parts or 50-74%  FIM - Upper Body Dressing/Undressing Upper body dressing/undressing steps patient completed: Thread/unthread left sleeve of pullover shirt/dress;Thread/unthread right sleeve of pullover shirt/dresss;Put head through opening of pull over shirt/dress;Pull shirt over trunk Upper body dressing/undressing: 5: Supervision: Safety issues/verbal cues FIM - Lower Body Dressing/Undressing Lower body dressing/undressing: 0: Activity did not occur  FIM - TMusicianDevices: Grab bar or rail for support Toileting: 1: Total-Patient completed zero steps, helper did all 3  FIM - TGlass blower/designerDevices: Bedside commode;Walker;Grab bars Toilet Transfers: 4-To toilet/BSC: Min A (steadying Pt. > 75%);4-From toilet/BSC: Min A (steadying Pt. > 75%)  FIM - Bed/Chair Transfer Bed/Chair Transfer Assistive Devices: Bed rails;Walker;Arm rests Bed/Chair Transfer: 4: Supine > Sit: Min A (steadying Pt. > 75%/lift 1 leg);4: Bed > Chair or W/C: Min A (steadying Pt. > 75%);3: Sit > Supine: Mod A (lifting assist/Pt. 50-74%/lift 2 legs);4: Chair or W/C > Bed: Min A (steadying Pt. > 75%)  FIM - Locomotion: Wheelchair Distance: 160 Locomotion: Wheelchair: 5: Travels 150 ft or more: maneuvers on rugs and over door sills with supervision, cueing or coaxing FIM - Locomotion: Ambulation Locomotion: Ambulation Assistive Devices: WAdministratorAmbulation/Gait Assistance: 5: Supervision;4: Min guard Locomotion: Ambulation: 1: Travels less than 50 ft with supervision/safety issues  Comprehension Comprehension Mode: Auditory Comprehension: 6-Follows complex conversation/direction: With extra time/assistive device  Expression Expression Mode: Verbal Expression: 6-Expresses complex ideas: With extra time/assistive device  Social Interaction Social Interaction: 6-Interacts appropriately with others with medication or extra time (anti-anxiety, antidepressant).  Problem Solving Problem Solving: 5-Solves basic problems: With no assist  Memory Memory: 6-More than reasonable amt of time  Medical Problem List and Plan:  1. Functional deficits secondary to Multitrauma with T11 burst fracture as well as spondylolisthesis with right L5 radiculopathy after a fall 11/30/2013  -Apply TLSO in supine  2. DVT Prophylaxis/Anticoagulation: Pharmaceutical: Lovenox  3. Pain Management: increase oxycontin to 276m  - flexeril for muscle spasms. Continue prn oxycodone.   -will   increase gabapentin to tid 4. Mood: Seems to be upbeat. Will have LCSW follow for evaluation and  support.  5. Neuropsych: This patient is capable of making decisions on his own behalf.  6. Skin/Wound Care: No problems noted. Only mild abrasion to left elbow Continue to monitor for now.  7. ABLA: Due to trauma--hgb 13.9--->12.4. Still 12.4 8. Acute on chronic renal insufficiency: Continue to encourage po fluid intake--BUN down to 23. Cr improved off HCTZ and Ace 2.02--->1.78 >>1.31 9. HTN: Monitor BP for recurrent orthostatic changes/dizziness. Allow some degree of hypertension.  10. Constipation: Ebowels moved this am cont current Rx Miralax   LOS (Days) 4 A FACE TO FACE EVALUATION WAS PERFORMED  Nasiah Polinsky T 12/08/2013, 8:04 AM

## 2013-12-08 NOTE — Progress Notes (Addendum)
Occupational Therapy Session Note  Patient Details  Name: Alexander Cox MRN: [id number] Date of Birth: 02-01-1946  Today's Date: 12/08/2013 Time: 7628-3151 Time Calculation (min): 45 min (missed 15 min due to pain)  Short Term Goals: Week 1:  OT Short Term Goal 1 (Week 1): Pt will tolerate bathing at shower level with TSLO on for 15 min  OT Short Term Goal 2 (Week 1): Pt will bathe with min A with AE prn at shower level  OT Short Term Goal 3 (Week 1): Pt will recall 3/3 back precautions functionally with min questioning cuing OT Short Term Goal 4 (Week 1): Pt will be able to instruct caregiver how to don brace properly with min questioning cuing OT Short Term Goal 5 (Week 1): Pt will transfer to toilet/ BSC with min A   Skilled Therapeutic Interventions/Progress Updates:  Patient sleeping upon arrival with TLSO on and wife at his side.  Engaged in bed mobility, toilet transfer, and toileting with focus on adhering to back precautions, sit><stands, dynamic standing balance, activity tolerance, bed positioning for pain management of RLE, review of back precautions to include discussion on movements and tasks performed in BADL & IADL throughout the day.  Both patient and wife verbalized understanding.  Patient resting-painfree-on his left side with pillows under his RLE and one at his back to remind him to stay on his side and not to twist.  Patient required vcs during all above tasks to adhere to back precautions and occasional steady assist during dynamic standing.  Therapy Documentation Precautions:  Precautions Precautions: Back Precaution Comments: no BAT  Required Braces or Orthoses: Spinal Brace Spinal Brace: Thoracolumbosacral orthotic;Applied in supine position Restrictions Weight Bearing Restrictions: No Pain: 6/10 RLE, rest and repositioned  Therapy/Group: Individual Therapy  Lama Narayanan 12/08/2013, 4:13 PM

## 2013-12-08 NOTE — Progress Notes (Signed)
Physical Therapy Session Note  Patient Details  Name: Alexander Cox MRN: [id number] Date of Birth: 1946-03-04  Today's Date: 12/08/2013 Time: 5956-3875 & 6433-2951 Time Calculation (min): 61 min & 29 min  Short Term Goals: Week 1:  PT Short Term Goal 1 (Week 1): Pt will perform bed mobility with min A, HOB flat and no rail.  PT Short Term Goal 2 (Week 1): Pt will perform bed <> w/c transfers with supervision.  PT Short Term Goal 3 (Week 1): Pt will ambulate 30 ft using RW and min A.  PT Short Term Goal 4 (Week 1): Pt will negotiate up/down 5 stairs using 2 rails and min A.  PT Short Term Goal 5 (Week 1): Pt will propel w/c x 150 ft with supervision.  Skilled Therapeutic Interventions/Progress Updates:  AM Session (61 min): Pt. Received 6-7/ 10 pain with pt. Seated EOB having completed breakfast. Pt. Notified therapist he was recently given pain medication by RN. Pt. Agreeable to therapy session and wife eager to learn stretching and transfers from therapist.  2:1 (spouse education) Treatment focused on transfers with BAT precautions, w/c mgmt, stretching/nerve glide R hip for pain mgmt and functional performance increase. Pt. Assist level is min guard to (S)  with RW. Pain continues to be limiting factor with predictable gait/ambulation and activity participation during session. W/C distance greater than 200 feet with multiple seated rest breaks secondary to pain. Pt. Spouse instructed on how to stretch and perform nerve glides on pt. To manage pain. Spouse able to provide teach-back after demonstration and instruction. Pt. Indicated pain levels decreased and fell asleep requiring arousal while in gym. Therapist instructed and demonstrated proper log-rolling with supine<>sit; spouse and pt. Able to perform proper and safe transfer with occasion verbal cue and correction to technique. Both pt. And spouse demonstrated improved transfer with minimal or no patient discomfort.  Pain: 5-8/10 during  session with RN notified.  Pt. Condition post therapy session: positioned supine in bed with with all needs within reach. Spouse present to attend to additional needs.   PM Session (29 min): Pt. Received 3-4/10 pain supine in bed with wife assisting pt. Donn TLSO with min A from therapist for postioning and safety.  2:1 (spouse education) Treatment focused on ambulation, transfers with BAT precautions, and spouse educaton. Pt. Assist level is min guard A to (S)  with RW and w/c follow for safety. Pt. Ambulated Ambulation distances: 90, 45, and 30 feet with several therapeutic rest/recovery breaks. Spouse instructed on visible gait changes that occur with pt's increased R Hip pain as precursor to needing to rest; pt. Provides little to no audible communication before needing to sit and must be cued for safety.  Pt. Sit>supine with spouse; doff TLSO with additional assistance from therapist for bed positioning.  Pain: 5-6/10 during ambulation, subsiding to 3/10 with supine bed positioning. RN notified.  Pt. Condition post therapy session: positioned supine in bed with HOB elevated by spouse with therapist supervision and additional cues for safety with all needs within reach.    Therapy Documentation Precautions:  Precautions Precautions: Back Precaution Comments: no BAT  Required Braces or Orthoses: Spinal Brace Spinal Brace: Thoracolumbosacral orthotic;Applied in supine position Restrictions Weight Bearing Restrictions: No Pain: Pain Assessment Pain Assessment: 0-10 Pain Score: 3  Pain Type: Acute pain Pain Location: Hip Pain Orientation: Right Pain Radiating Towards: leg Pain Descriptors / Indicators: Aching Pain Onset: On-going Pain Intervention(s): Medication (See eMAR) Locomotion : Ambulation Ambulation/Gait Assistance: 4: Min guard;5: Supervision  Wheelchair Mobility Distance: 200   See FIM for current functional status  Therapy/Group: Individual Therapy  Juluis Mire 12/08/2013, 4:14 PM

## 2013-12-09 ENCOUNTER — Inpatient Hospital Stay (HOSPITAL_COMMUNITY): Payer: Self-pay | Admitting: Rehabilitation

## 2013-12-09 ENCOUNTER — Inpatient Hospital Stay (HOSPITAL_COMMUNITY): Payer: Self-pay | Admitting: Occupational Therapy

## 2013-12-09 ENCOUNTER — Encounter (HOSPITAL_COMMUNITY): Payer: Self-pay | Admitting: Occupational Therapy

## 2013-12-09 MED ORDER — GABAPENTIN 300 MG PO CAPS
300.0000 mg | ORAL_CAPSULE | Freq: Three times a day (TID) | ORAL | Status: DC
  Administered 2013-12-09 – 2013-12-10 (×3): 300 mg via ORAL
  Filled 2013-12-09 (×6): qty 1

## 2013-12-09 MED ORDER — NORTRIPTYLINE HCL 25 MG PO CAPS
25.0000 mg | ORAL_CAPSULE | Freq: Every day | ORAL | Status: DC
  Administered 2013-12-09 – 2013-12-10 (×2): 25 mg via ORAL
  Filled 2013-12-09 (×4): qty 1

## 2013-12-09 NOTE — Progress Notes (Signed)
Physical Therapy Session Note  Patient Details  Name: Alexander Cox MRN: [id number] Date of Birth: 02/28/1946  Today's Date: 12/09/2013 Time: 0354-6568 Time Calculation (min): 61 min  Short Term Goals: Week 1:  PT Short Term Goal 1 (Week 1): Pt will perform bed mobility with min A, HOB flat and no rail.  PT Short Term Goal 2 (Week 1): Pt will perform bed <> w/c transfers with supervision.  PT Short Term Goal 3 (Week 1): Pt will ambulate 30 ft using RW and min A.  PT Short Term Goal 4 (Week 1): Pt will negotiate up/down 5 stairs using 2 rails and min A.  PT Short Term Goal 5 (Week 1): Pt will propel w/c x 150 ft with supervision.  Skilled Therapeutic Interventions/Progress Updates:   Pt received lying in bed with wife present in room, agreeable to therapy session.  Note pt with increased pain earlier and missed OT session, but has received pain meds and note slight improvement.  Wife assisted with donning back brace and was able to do so without assist.  Performed bed mobility at S level with use of bedrails.  Transferred to chair without AD at mod A level due to increased pain, speed and decreased safety with transfer.  Max verbal cues for safety.  Pt self propelled to therapy gym using BLEs to increase overall strength and endurance x 150', requires several rest breaks due to fatigue.  Skilled session focused on car transfers, gait in controlled environment, gait around/over obstacles and also practicing how to turn and sit on rollator when fatigued.  Performed car transfer at S level with mod cues for safety with RW, as it was noted that turns tend to be difficult and rollator tends to be kept far from pt.  Cues for safety when turning into car.  Ambulated 100' x 1, 50' x 1, and another 63' x 1 during session, all at S level.  Note decreased antalgic gait as pain continued to improve.  Performed obstacle negotiation at close S to min/guard level for safety and max verbal cues for positioning of  rollator for safety.  Educated on performing nerve glides/stretching with use sheet around LE to increase independence with task.  Pt/wife verbalized understanding.  Ended session with gait towards pt room and having pt turn to sit on rollator.  Cues for maintaining back precautions, other did at S level.  Needs continued practice.  Pt left in w/c in room with all needs in reach.    Therapy Documentation Precautions:  Precautions Precautions: Back Precaution Comments: no BAT  Required Braces or Orthoses: Spinal Brace Spinal Brace: Thoracolumbosacral orthotic;Applied in supine position Restrictions Weight Bearing Restrictions: No   Vital Signs: Therapy Vitals Temp: 98.1 F (36.7 C) Temp src: Oral Pulse Rate: 73 Resp: 18 BP: 128/70 mmHg Patient Position (if appropriate): Lying Oxygen Therapy SpO2: 100 % Pain: Pain Assessment Pain Assessment: 0-10 Pain Score: 3  Pain Type: Acute pain Pain Location: Hip Pain Orientation: Right Pain Radiating Towards: leg Pain Descriptors / Indicators: Aching Pain Frequency: Intermittent Pain Onset: Gradual Patients Stated Pain Goal: 1 Pain Intervention(s): Medication (See eMAR) Pt with 6-7/10 pain initially, however progressed to 3-4/10 by end of session.    See FIM for current functional status  Therapy/Group: Individual Therapy  Denice Bors 12/09/2013, 4:17 PM

## 2013-12-09 NOTE — Progress Notes (Signed)
Subjective/Complaints: Had another difficult night with pain in right hip and leg. Didn't sleep much as he couldn't find a comfortable position   Review of Systems - Negative except as above, bowels moved Objective: Vital Signs: Blood pressure 134/77, pulse 69, temperature 98.9 F (37.2 C), temperature source Oral, resp. rate 18, weight 70.9 kg (156 lb 4.9 oz), SpO2 97.00%. No results found. Results for orders placed during the hospital encounter of 12/04/13 (from the past 72 hour(s))  CBC WITH DIFFERENTIAL     Status: Abnormal   Collection Time    12/07/13  6:47 AM      Result Value Ref Range   WBC 7.0  4.0 - 10.5 K/uL   RBC 4.05 (*) 4.22 - 5.81 MIL/uL   Hemoglobin 12.4 (*) 13.0 - 17.0 g/dL   HCT 37.7 (*) 39.0 - 52.0 %   MCV 93.1  78.0 - 100.0 fL   MCH 30.6  26.0 - 34.0 pg   MCHC 32.9  30.0 - 36.0 g/dL   RDW 12.2  11.5 - 15.5 %   Platelets 217  150 - 400 K/uL   Neutrophils Relative % 68  43 - 77 %   Neutro Abs 4.7  1.7 - 7.7 K/uL   Lymphocytes Relative 20  12 - 46 %   Lymphs Abs 1.4  0.7 - 4.0 K/uL   Monocytes Relative 9  3 - 12 %   Monocytes Absolute 0.6  0.1 - 1.0 K/uL   Eosinophils Relative 3  0 - 5 %   Eosinophils Absolute 0.2  0.0 - 0.7 K/uL   Basophils Relative 0  0 - 1 %   Basophils Absolute 0.0  0.0 - 0.1 K/uL  COMPREHENSIVE METABOLIC PANEL     Status: Abnormal   Collection Time    12/07/13  6:47 AM      Result Value Ref Range   Sodium 138  137 - 147 mEq/L   Potassium 3.9  3.7 - 5.3 mEq/L   Chloride 99  96 - 112 mEq/L   CO2 27  19 - 32 mEq/L   Glucose, Bld 90  70 - 99 mg/dL   BUN 23  6 - 23 mg/dL   Creatinine, Ser 1.31  0.50 - 1.35 mg/dL   Calcium 8.8  8.4 - 10.5 mg/dL   Total Protein 6.5  6.0 - 8.3 g/dL   Albumin 3.2 (*) 3.5 - 5.2 g/dL   AST 41 (*) 0 - 37 U/L   ALT 55 (*) 0 - 53 U/L   Alkaline Phosphatase 164 (*) 39 - 117 U/L   Total Bilirubin 0.8  0.3 - 1.2 mg/dL   GFR calc non Af Amer 55 (*) >90 mL/min   GFR calc Af Amer 63 (*) >90 mL/min   Comment:  (NOTE)     The eGFR has been calculated using the CKD EPI equation.     This calculation has not been validated in all clinical situations.     eGFR's persistently <90 mL/min signify possible Chronic Kidney     Disease.   Anion gap 12  5 - 15     HEENT: normal Cardio: RRR and no murmur Resp: CTA B/L and unlabored GI: BS positive and NT,ND Extremity:  No Edema Skin:   Intact Neuro: Alert/Oriented and Cranial Nerve II-XII normal Musc/Skel:  Other no tenderness over R hip, no pain with hip ROM, - SLR Gen NAD Motor strength is 5/5 bilateral deltoid, bicep, tricep, grip  3/5 in the right  hip flexor knee extensor 4-/5 right ankle dorsiflexor plantar flexor  4/5 left hip flexor 4/5 knee extensor 5/5 ankle dorsiflexor plantar flexor    Assessment/Plan: 1. Functional deficits secondary to T 11 burst fx, R L4/5 Radic which require 3+ hours per day of interdisciplinary therapy in a comprehensive inpatient rehab setting. Physiatrist is providing close team supervision and 24 hour management of active medical problems listed below. Physiatrist and rehab team continue to assess barriers to discharge/monitor patient progress toward functional and medical goals.  Making functional progress despite pain   FIM: FIM - Bathing Bathing Steps Patient Completed: Chest;Right Arm;Left Arm;Abdomen;Front perineal area;Buttocks;Right upper leg;Left upper leg Bathing: 4: Min-Patient completes 8-9 35f 10 parts or 75+ percent  FIM - Upper Body Dressing/Undressing Upper body dressing/undressing steps patient completed: Thread/unthread left sleeve of pullover shirt/dress;Thread/unthread right sleeve of pullover shirt/dresss;Put head through opening of pull over shirt/dress;Pull shirt over trunk Upper body dressing/undressing: 5: Supervision: Safety issues/verbal cues FIM - Lower Body Dressing/Undressing Lower body dressing/undressing steps patient completed: Thread/unthread right pants leg;Thread/unthread left  pants leg;Pull pants up/down;Don/Doff right sock;Don/Doff left sock;Don/Doff right shoe;Don/Doff left shoe Lower body dressing/undressing: 4: Min-Patient completed 75 plus % of tasks (min assist during standing secondary to decreased dynamic standing balance/tolerance/endurance)  FIM - Toileting Toileting Assistive Devices: Grab bar or rail for support Toileting: 1: Total-Patient completed zero steps, helper did all 3  FIM - Radio producer Devices: Walker;Grab bars;Bedside commode Toilet Transfers: 4-To toilet/BSC: Min A (steadying Pt. > 75%);4-From toilet/BSC: Min A (steadying Pt. > 75%)  FIM - Bed/Chair Transfer Bed/Chair Transfer Assistive Devices: Bed rails;Walker;Arm rests Bed/Chair Transfer: 4: Supine > Sit: Min A (steadying Pt. > 75%/lift 1 leg);4: Bed > Chair or W/C: Min A (steadying Pt. > 75%);3: Sit > Supine: Mod A (lifting assist/Pt. 50-74%/lift 2 legs);4: Chair or W/C > Bed: Min A (steadying Pt. > 75%)  FIM - Locomotion: Wheelchair Distance: 200 Locomotion: Wheelchair: 5: Travels 150 ft or more: maneuvers on rugs and over door sills with supervision, cueing or coaxing FIM - Locomotion: Ambulation Locomotion: Ambulation Assistive Devices: Administrator Ambulation/Gait Assistance: 4: Min guard;5: Supervision Locomotion: Ambulation: 2: Travels 50 - 149 ft with supervision/safety issues  Comprehension Comprehension Mode: Auditory Comprehension: 5-Follows basic conversation/direction: With extra time/assistive device  Expression Expression Mode: Verbal Expression: 5-Expresses basic needs/ideas: With extra time/assistive device  Social Interaction Social Interaction: 6-Interacts appropriately with others with medication or extra time (anti-anxiety, antidepressant).  Problem Solving Problem Solving: 5-Solves basic 90% of the time/requires cueing < 10% of the time  Memory Memory: 5-Recognizes or recalls 90% of the time/requires cueing < 10% of  the time  Medical Problem List and Plan:  1. Functional deficits secondary to Multitrauma with T11 burst fracture as well as spondylolisthesis with right L5 radiculopathy after a fall 11/30/2013  -Apply TLSO in supine  2. DVT Prophylaxis/Anticoagulation: Pharmaceutical: Lovenox  3. Pain Management: despite continual adjustment, he continues to have pain  -increased oxycontin to $RemoveBefo'20mg'byOqHRrDRBU$ .  - flexeril for muscle spasms. Continue prn oxycodone.   -increase gabapentin to $RemoveBefor'300mg'EtZclMQYVmbU$  tid  -will add pamelor $RemoveBef'25mg'QGtMyIcOma$  qhs 4. Mood: Seems to be upbeat. Will have LCSW follow for evaluation and support.  5. Neuropsych: This patient is capable of making decisions on his own behalf.  6. Skin/Wound Care: No problems noted. Only mild abrasion to left elbow Continue to monitor for now.  7. ABLA: Due to trauma--hgb 13.9--->12.4. Still 12.4 8. Acute on chronic renal insufficiency: Continue to encourage po fluid  intake--BUN down to 23. Cr improved off HCTZ and Ace 2.02--->1.78 >>1.31 9. HTN: Monitor BP for recurrent orthostatic changes/dizziness. Allow some degree of hypertension.  10. Constipation: Ebowels moved this am cont current Rx Miralax   LOS (Days) 5 A FACE TO FACE EVALUATION WAS PERFORMED  Alexander Cox T 12/09/2013, 8:11 AM

## 2013-12-09 NOTE — Progress Notes (Signed)
Patient states that his pain has decreased, voices no complaint, resting in bed. Oval Linsey, RN

## 2013-12-09 NOTE — Progress Notes (Signed)
Social Work Patient ID: Alexander Cox, male   DOB: 13-Jun-1945, 68 y.o.   MRN: [id number]  CSW met with pt and his wife to update them on team conference discussion.  They are pleased with Saturday, 12-12-13, d/c date and feel pt will be ready to go home by then.  Pt will need follow up home health and a tub bench and bedside commode.  They have no preference of agency/company, so CSW will arrange for pt in preparation for Saturday d/c.  CSW to assist with any other needs as they arise.

## 2013-12-09 NOTE — Progress Notes (Signed)
Physical Therapy Session Note  Patient Details  Name: Alexander Cox MRN: [id number] Date of Birth: [birth year]  Today's Date: 12/09/2013 Time: 4098-1191 Time Calculation (min): 77 min  Short Term Goals: Week 1:  PT Short Term Goal 1 (Week 1): Pt will perform bed mobility with min A, HOB flat and no rail.  PT Short Term Goal 2 (Week 1): Pt will perform bed <> w/c transfers with supervision.  PT Short Term Goal 3 (Week 1): Pt will ambulate 30 ft using RW and min A.  PT Short Term Goal 4 (Week 1): Pt will negotiate up/down 5 stairs using 2 rails and min A.  PT Short Term Goal 5 (Week 1): Pt will propel w/c x 150 ft with supervision.  Skilled Therapeutic Interventions/Progress Updates:   Pt received sitting in w/c in room, wife present during session to observe.  Wife concerned with home entry with stairs and also ambulation over grass to get to/from stairs.  Skilled session then focused on gait with using personal rollator, stair negotiation to simulate home entry and also community and outdoor ambulation to simulate home entry, as well as to simulate ambulation through tight/busy area in gift shop.  Initiated session with R LE stretched w/ education on how to perform seated in chair.  Pt performed gait in controlled environment at S level with min cues for upright and relaxed posture and increased step length on LLE to decrease antalgic gait pattern.  Performed >200' gait, however requires seated rest breaks following aprox 70-90' due to fatigue and pain in RLE.  Ambulated up/down incline at close S level.  Ambulated over uneven grass area at min A level with mod cues for maintaining closer position to rollator and decreased gait speed for safety.  Pt tolerated well, again with several seated rest breaks.  Ended with gait through gift shop at close S level with single rest break.  Also performed bed mobility in therapy gym at S level with good carry over of log roll technique.  Pt assisted back to  room and transferred to recliner.  All needs in reach and wife in room.  Wife feeling better about home set up of getting into/out of house following session.   Therapy Documentation Precautions:  Precautions Precautions: Back Precaution Comments: no BAT  Required Braces or Orthoses: Spinal Brace Spinal Brace: Thoracolumbosacral orthotic;Applied in supine position Restrictions Weight Bearing Restrictions: No     Pain: Pain Assessment Pain Assessment: 0-10 Pain Score: 8  Pain Type: Acute pain Pain Location: Hip Pain Orientation: Right Pain Radiating Towards: leg Pain Descriptors / Indicators: Aching Pain Frequency: Intermittent Pain Onset: On-going Patients Stated Pain Goal: 2 Pain Intervention(s): Medication (See eMAR) Ambulation Ambulation/Gait Assistance: 5: Supervision Wheelchair Mobility Distance: 150   See FIM for current functional status  Therapy/Group: Individual Therapy  Denice Bors 12/09/2013, 11:27 AM

## 2013-12-09 NOTE — Progress Notes (Signed)
Provided ice pack and educated the patient on ice therapy benefits, patient stated that he "might" use it, asked me to put ice pack on bedside table.  Assisted patient with repositioning, applying pillow along right side, elevating right leg and foot for comfort.  Oval Linsey, RN

## 2013-12-09 NOTE — Progress Notes (Signed)
Patient rating pain 7/10, hip and back, provided pain med, offered Kpad, patient declined. Patient turned onto left side for comfort. Will continue to monitor. Oval Linsey, RN

## 2013-12-09 NOTE — Progress Notes (Signed)
Occupational Therapy Session Notes  Patient Details  Name: Alexander Cox MRN: [id number] Date of Birth: 1946-01-15  Today's Date: 12/09/2013  Short Term Goals: Week 1:  OT Short Term Goal 1 (Week 1): Pt will tolerate bathing at shower level with TSLO on for 15 min  OT Short Term Goal 2 (Week 1): Pt will bathe with min A with AE prn at shower level  OT Short Term Goal 3 (Week 1): Pt will recall 3/3 back precautions functionally with min questioning cuing OT Short Term Goal 4 (Week 1): Pt will be able to instruct caregiver how to don brace properly with min questioning cuing OT Short Term Goal 5 (Week 1): Pt will transfer to toilet/ BSC with min A   Skilled Therapeutic Interventions/Progress Updates:   Session #1 1517-6160 - 45 Minutes Individual Therapy Patient with 6/10 complaints of pain at beginning of session and 8/10 during standing; RN made aware Patient received supine in bed without TLSO donned with wife present. Therapist found patient at 20* of HOB raised. Therapist reiterated the importance of keeping bed at 0*/supine when TLSO not donned. Therapist set-up basin and patient completed UB bathing & dressing while in supine position. Wife assisted with donning of TLSO, patient sat EOB with steady assist, donned shoes with min assist (elastic shoe strings on shoes), stood with rollator, and ambulated into BR for toilet transfer onto BSC seated over elevated toilet seat. Patient able to perform clothing management prior to & post toileting, but required assistance with peri care. Patient ambulated back to w/c and sat at sink for grooming task of brushing teeth. Patient's wife assisted prn and provided appropriate verbal cues for adherence to back precautions. This therapist checked patient's wife off on safety plan for assisting patient <>BR. At end of session, left patient seated in w/c with wife present assisting with completion of LB ADLs secondary to running out of time with this OT.    Session #2 1330-1400 - 30 Minutes 30 Minutes missed time secondary to pain Individual Therapy Patient with complaints of 10/10 pain in RLE (hip>ankle) Patient received side lying (>left side) with wife at bedside. Patient grimacing in pain and stating his pain was 10/10. Therapist worked with patient on pain management (re-positioning, PROM > RLE, encouraging pursed lip breathing as a relaxation technique). Therapist notified RN of patient's complaints of pain and RN present for medication management. Therapist discussed possible Lavender scent with TR as a relaxation technique; patient tends to tense up when in a lot of pain. At end of session, left patient in side lying position > left side with pillows.  Per patient's wife report, he gets increased "spouts" of pain throughout the day; encouraged her to make a journal of his day and when his pain is the worse. RN and therapy team aware of missed time.   Precautions:  Precautions Precautions: Back Precaution Comments: no BAT  Required Braces or Orthoses: Spinal Brace Spinal Brace: Thoracolumbosacral orthotic;Applied in supine position Restrictions Weight Bearing Restrictions: No  See FIM for current functional status  Alexander Cox 12/09/2013, 7:20 AM

## 2013-12-09 NOTE — Patient Care Conference (Signed)
Inpatient RehabilitationTeam Conference and Plan of Care Update Date: 12/08/2013   Time: 2:00 PM    Patient Name: Alexander Cox      Medical Record Number: [id number]  Date of Birth: [birth year] Sex: Male         Room/Bed: 4W06C/4W06C-01 Payor Info: Payor: Marine scientist / Plan: [employer] MEDICARE COMPLETE / Product Type: *No Product type* /    Admitting Diagnosis: t11 fx  Admit Date/Time:  12/04/2013  5:18 PM Admission Comments: No comment available   Primary Diagnosis:  <principal problem not specified> Principal Problem: <principal problem not specified>  Patient Active Problem List   Diagnosis Date Noted  . Traumatic burst fracture of thoracic vertebra 12/04/2013  . Dehydration 12/01/2013  . Multiple fractures of ribs of left side 12/01/2013  . HTN (hypertension) 12/01/2013  . Hyperlipidemia 12/01/2013  . Acute renal insufficiency 12/01/2013  . T11 vertebral fracture 11/30/2013  . Hypotension 11/30/2013  . Syncope 11/30/2013    Expected Discharge Date: Expected Discharge Date: 12/12/13  Team Members Present: Physician leading conference: Dr. Alger Simons Social Worker Present: Alfonse Alpers, LCSW Nurse Present: Elliot Cousin, RN PT Present: Rada Hay, Cecille Rubin, PT OT Present: Chrys Racer, Olena Leatherwood, OT Other (Discipline and Name): Danne Baxter, RN [facility]) PPS Coordinator present : Daiva Nakayama, RN, CRRN;Becky Alwyn Ren, PT     Current Status/Progress Goal Weekly Team Focus  Medical   thoracic compression fx, lumbar fx with radiculopathy  improve functional mobility  improve pain control and activity tolerance   Bowel/Bladder   Continent of bowel and bladder  Min A  Remain continent of bowel and bladder   Swallow/Nutrition/ Hydration             ADL's   Overall min assist  overall supervision>mod I  ADL retraining, functional transfers, sit<>stands, dynamic standing balance/tolerance/endurance, pain managment, overall activity  tolerance/endurance   Mobility   min A 2* to pain  Mod I overall, (S) Car transfer  Pain control: positioning & stretching, transfers (BAT precautions), safety/awareness   Communication             Safety/Cognition/ Behavioral Observations  Min A  Supervision  Call for assistance   Pain   Oxycodone 12hr. tablet 20mg ., Oxy IR 10-15mg . q 4hr. PRN  <4 on a 0-10 scale  Assess pain q 4hr. and after each PRN medication intervention and as needed   Skin   Bruising to to bottom and L flank  Remain free from infection and breakdown while on rehab  Assess skin q shift    Rehab Goals Patient on target to meet rehab goals: Yes Rehab Goals Revised: None *See Care Plan and progress notes for long and short-term goals.  Barriers to Discharge: pain mgt, brace    Possible Resolutions to Barriers:  pain control, egosupport    Discharge Planning/Teaching Needs:  Pt plans to return to his home with his wife and sons to assist as needed.  Wife is present daily for therapies.  Can complete formal family education as needed.   Team Discussion:  Pt continues to have pain issues and MD is working on this.  He is using brace and wife is helping him with this.  Pt had some kidney dysfunction that seems to be improving.  Wife has been taught stretching routine by PT.  Pt's tibia is sensitive and he continues to have jerking and spasms.  Pt did walk 100' and is at min assist with transfers.  Revisions to Treatment Plan:  None   Continued Need for Acute Rehabilitation Level of Care: The patient requires daily medical management by a physician with specialized training in physical medicine and rehabilitation for the following conditions: Daily direction of a multidisciplinary physical rehabilitation program to ensure safe treatment while eliciting the highest outcome that is of practical value to the patient.: Yes Daily medical management of patient stability for increased activity during participation in an  intensive rehabilitation regime.: Yes Daily analysis of laboratory values and/or radiology reports with any subsequent need for medication adjustment of medical intervention for : Post surgical problems;Neurological problems  Keitra Carusone, Silvestre Mesi 12/09/2013, 12:46 PM

## 2013-12-10 ENCOUNTER — Inpatient Hospital Stay (HOSPITAL_COMMUNITY): Payer: Self-pay

## 2013-12-10 ENCOUNTER — Encounter (HOSPITAL_COMMUNITY): Payer: Self-pay | Admitting: Occupational Therapy

## 2013-12-10 ENCOUNTER — Inpatient Hospital Stay (HOSPITAL_COMMUNITY): Payer: Self-pay | Admitting: Occupational Therapy

## 2013-12-10 LAB — BASIC METABOLIC PANEL
Anion gap: 16 — ABNORMAL HIGH (ref 5–15)
BUN: 23 mg/dL (ref 6–23)
CHLORIDE: 98 meq/L (ref 96–112)
CO2: 22 meq/L (ref 19–32)
CREATININE: 1.24 mg/dL (ref 0.50–1.35)
Calcium: 9.1 mg/dL (ref 8.4–10.5)
GFR calc Af Amer: 68 mL/min — ABNORMAL LOW (ref >90)
GFR calc non Af Amer: 58 mL/min — ABNORMAL LOW (ref >90)
Glucose, Bld: 110 mg/dL — ABNORMAL HIGH (ref 70–99)
Potassium: 4.6 mEq/L (ref 3.7–5.3)
SODIUM: 136 meq/L — AB (ref 137–147)

## 2013-12-10 LAB — URINALYSIS, ROUTINE W REFLEX MICROSCOPIC
BILIRUBIN URINE: NEGATIVE
Glucose, UA: NEGATIVE mg/dL
HGB URINE DIPSTICK: NEGATIVE
Ketones, ur: NEGATIVE mg/dL
Leukocytes, UA: NEGATIVE
Nitrite: NEGATIVE
PROTEIN: NEGATIVE mg/dL
Specific Gravity, Urine: 1.011 (ref 1.005–1.030)
Urobilinogen, UA: 0.2 mg/dL (ref 0.0–1.0)
pH: 5.5 (ref 5.0–8.0)

## 2013-12-10 MED ORDER — BACLOFEN 5 MG HALF TABLET: 5.0000 mg | ORAL_TABLET | Freq: Two times a day (BID) | ORAL | Status: DC

## 2013-12-10 MED ORDER — BACLOFEN 5 MG HALF TABLET
5.0000 mg | ORAL_TABLET | Freq: Two times a day (BID) | ORAL | Status: DC
  Filled 2013-12-10 (×3): qty 1

## 2013-12-10 MED ORDER — GABAPENTIN 100 MG PO CAPS
200.0000 mg | ORAL_CAPSULE | Freq: Three times a day (TID) | ORAL | Status: DC
  Administered 2013-12-11 – 2013-12-13 (×8): 200 mg via ORAL
  Filled 2013-12-10 (×13): qty 2

## 2013-12-10 MED ORDER — OXYCODONE HCL ER 10 MG PO T12A: 10.0000 mg | EXTENDED_RELEASE_TABLET | Freq: Two times a day (BID) | ORAL | Status: DC

## 2013-12-10 MED ORDER — GABAPENTIN 100 MG PO CAPS
200.0000 mg | ORAL_CAPSULE | Freq: Three times a day (TID) | ORAL | Status: DC
  Filled 2013-12-10 (×3): qty 2

## 2013-12-10 MED ORDER — BACLOFEN 10 MG PO TABS
10.0000 mg | ORAL_TABLET | Freq: Every day | ORAL | Status: DC
  Administered 2013-12-10: 10 mg via ORAL
  Filled 2013-12-10 (×2): qty 1

## 2013-12-10 MED ORDER — GABAPENTIN 100 MG PO CAPS
200.0000 mg | ORAL_CAPSULE | Freq: Three times a day (TID) | ORAL | Status: DC
  Filled 2013-12-10 (×2): qty 2

## 2013-12-10 MED ORDER — BACLOFEN 5 MG HALF TABLET
5.0000 mg | ORAL_TABLET | Freq: Two times a day (BID) | ORAL | Status: DC
  Administered 2013-12-10: 5 mg via ORAL
  Filled 2013-12-10 (×4): qty 1

## 2013-12-10 NOTE — Progress Notes (Signed)
OT reporting patient confused and hallucinating this am. Wife reports he has been a little disoriented since yesterday but is worse this am. She is concerned about all the medications he is getting and hopes that he will be able to get off some of this by discharge. Patient having occasional pain "sharp twisting pain" that grabs him and last 20-30 minutes.   On exam patient sedated and confused appearing with slurred speech and difficulty keeping eyes open. Talking about ants on the ceiling. Discussed recent medications changes and that patient likely having SE to these. Will decrease neurontin back to 200 mg tid (keep at 600mg /day) and decrease oxycontin to 10 mg bid. Flexeril changed to baclofen but not initiated yes. Encouraged wife to push po fluids as dehydration can cause meds to build up. Will check lytes as well as UA/UCS to rule out UTI.

## 2013-12-10 NOTE — Progress Notes (Signed)
Physical Therapy Session Note  Patient Details  Name: Alexander Cox MRN: [id number] Date of Birth: 1946/04/05  Today's Date: 12/10/2013 Time: 1219-7588 Time Calculation (min): 30 min  Short Term Goals: Week 1:  PT Short Term Goal 1 (Week 1): Pt will perform bed mobility with min A, HOB flat and no rail.  PT Short Term Goal 2 (Week 1): Pt will perform bed <> w/c transfers with supervision.  PT Short Term Goal 3 (Week 1): Pt will ambulate 30 ft using RW and min A.  PT Short Term Goal 4 (Week 1): Pt will negotiate up/down 5 stairs using 2 rails and min A.  PT Short Term Goal 5 (Week 1): Pt will propel w/c x 150 ft with supervision.  Skilled Therapeutic Interventions/Progress Updates:  Pt. Received seated in w/c in gym; denies pain. Wife states he is having visual hallucinations; RN aware. Pt. Is agreeable to therapy.  1:1 Treatment focused on gait and bilat LE strengthening and coordination. Pt. Assist level is min A with RW for safety with w/c follow by spouse.  Pt. Not oriented; wife states pt has been having adverse reactions to pain medication. Pt. Able to follow directions, but requires frequent verbal cues for task performance and continuity of activity. Pt. Ambulated 160 feet with RW with 2 therapeutic rest/recovery breaks with brief increase in pain to 8/10 and subsiding in <34min with rest in w/c. Pt. Performed BLE exercises for coordination and muscle sequencing utilizing basket ball under sole of shoes with circular movements. Bilateral LE LAQ with basketball for resistance with hip adduction in isometric contraction while seated in W/C. Pt. Spouse provided additional education on exercise purpose and adherance to similar activities for muscle function and sequential muscle relaxation.   Pt. Condition post therapy session: positioned w/c with all needs within reach. SW present and addressing patients additional needs.  PA consulted on pt. Condition and current functional status with  pain medication dosing.   Therapy Documentation Precautions:  Precautions Precautions: Back Precaution Comments: no BAT  Required Braces or Orthoses: Spinal Brace Spinal Brace: Thoracolumbosacral orthotic;Applied in supine position Restrictions Weight Bearing Restrictions: No General: Amount of Missed PT Time (min): 30 Minutes Missed Time Reason: Other (comment) (Patient inappropriate for therapy due to medication reaction. PA Consulted) Pain: Pain Assessment Pain Assessment: No/denies pain Pain Score: 0-No pain Locomotion : Ambulation Ambulation/Gait Assistance: 5: Supervision   See FIM for current functional status  Therapy/Group: Individual Therapy  Juluis Mire 12/10/2013, 3:51 PM

## 2013-12-10 NOTE — Progress Notes (Signed)
Occupational Therapy Note  Patient Details  Name: RANDY CASTREJON MRN: [id number] Date of Birth: Sep 07, 1945 Today's Date: 12/10/2013  Time: 72 Pt c/o 5/10 pain in Rt hip and Rt calf; premedicated before therapy; repositioned Individual Therapy  Pt engaged in functional amb with RW for home mgmt and simple kitchen tasks.  Pt gathered clothing in ADL apartment and kitchen.  Pt issued walker bag and reacher bag for walker.  Pt amb with RW to therapy gym to engage in dynamic standing activities and functional amb with RW to pick up items from floor using reacher.  Wife present and provided appropriate level of supervision throughout session.  Focus on activity tolerance, functional amb with RW, safety awareness, dynamic standing balance, continued family education, and sit<>stand.   Leotis Shames [facility] 12/10/2013, 1:58 PM

## 2013-12-10 NOTE — Progress Notes (Signed)
Subjective/Complaints: Pain control still an issue. Has a sharp, shooting pain in right hip at night which woke him last night   Review of Systems - Negative except as above, bowels moved Objective: Vital Signs: Blood pressure 145/78, pulse 71, temperature 97.7 F (36.5 C), temperature source Oral, resp. rate 18, weight 70.9 kg (156 lb 4.9 oz), SpO2 94.00%. No results found. No results found for this or any previous visit (from the past 72 hour(s)).   HEENT: normal Cardio: RRR and no murmur Resp: CTA B/L and unlabored GI: BS positive and NT,ND Extremity:  No Edema Skin:   Intact Neuro: Alert/Oriented and Cranial Nerve II-XII normal Musc/Skel:  Other no tenderness over R hip, no pain with hip ROM, - SLR Gen NAD Motor strength is 5/5 bilateral deltoid, bicep, tricep, grip  3/5 in the right hip flexor knee extensor 4-/5 right ankle dorsiflexor plantar flexor  4/5 left hip flexor 4/5 knee extensor 5/5 ankle dorsiflexor plantar flexor    Assessment/Plan: 1. Functional deficits secondary to T 11 burst fx, R L4/5 Radic which require 3+ hours per day of interdisciplinary therapy in a comprehensive inpatient rehab setting. Physiatrist is providing close team supervision and 24 hour management of active medical problems listed below. Physiatrist and rehab team continue to assess barriers to discharge/monitor patient progress toward functional and medical goals.  Finalizing dc planning   FIM: FIM - Bathing Bathing Steps Patient Completed: Chest;Right Arm;Left Arm;Abdomen;Front perineal area;Buttocks;Right upper leg;Left upper leg Bathing: 4: Min-Patient completes 8-9 22f 10 parts or 75+ percent  FIM - Upper Body Dressing/Undressing Upper body dressing/undressing steps patient completed: Thread/unthread left sleeve of pullover shirt/dress;Thread/unthread right sleeve of pullover shirt/dresss;Put head through opening of pull over shirt/dress;Pull shirt over trunk Upper body  dressing/undressing: 5: Supervision: Safety issues/verbal cues FIM - Lower Body Dressing/Undressing Lower body dressing/undressing steps patient completed: Thread/unthread right pants leg;Thread/unthread left pants leg;Pull pants up/down;Don/Doff right sock;Don/Doff left sock;Don/Doff right shoe;Don/Doff left shoe Lower body dressing/undressing: 4: Min-Patient completed 75 plus % of tasks (min assist during standing secondary to decreased dynamic standing balance/tolerance/endurance)  FIM - Toileting Toileting Assistive Devices: Grab bar or rail for support Toileting: 1: Total-Patient completed zero steps, helper did all 3  FIM - Radio producer Devices: Walker;Grab bars;Bedside commode Toilet Transfers: 4-To toilet/BSC: Min A (steadying Pt. > 75%);4-From toilet/BSC: Min A (steadying Pt. > 75%)  FIM - Bed/Chair Transfer Bed/Chair Transfer Assistive Devices: Bed rails;Walker;Arm rests Bed/Chair Transfer: 5: Supine > Sit: Supervision (verbal cues/safety issues);5: Sit > Supine: Supervision (verbal cues/safety issues);5: Chair or W/C > Bed: Supervision (verbal cues/safety issues);5: Bed > Chair or W/C: Supervision (verbal cues/safety issues)  FIM - Locomotion: Wheelchair Distance: 150 Locomotion: Wheelchair: 2: Travels 50 - 149 ft with supervision, cueing or coaxing FIM - Locomotion: Ambulation Locomotion: Ambulation Assistive Devices: Administrator (rollator) Ambulation/Gait Assistance: 5: Supervision Locomotion: Ambulation: 2: Travels 50 - 149 ft with supervision/safety issues  Comprehension Comprehension Mode: Auditory Comprehension: 5-Follows basic conversation/direction: With extra time/assistive device  Expression Expression Mode: Verbal Expression: 5-Expresses basic needs/ideas: With extra time/assistive device  Social Interaction Social Interaction: 6-Interacts appropriately with others with medication or extra time (anti-anxiety,  antidepressant).  Problem Solving Problem Solving: 5-Solves basic 90% of the time/requires cueing < 10% of the time  Memory Memory: 5-Recognizes or recalls 90% of the time/requires cueing < 10% of the time  Medical Problem List and Plan:  1. Functional deficits secondary to Multitrauma with T11 burst fracture as well as spondylolisthesis with right  L5 radiculopathy after a fall 11/30/2013  -Apply TLSO in supine  2. DVT Prophylaxis/Anticoagulation: Pharmaceutical: Lovenox  3. Pain Management: despite continual adjustment, he continues to have pain  -increased oxycontin to 20mg .  - dc flexeril----add baclofen.   - Continue prn oxycodone.   -increase gabapentin to 300mg  tid  -will add pamelor 25mg  qhs 4. Mood: Seems to be upbeat. Will have LCSW follow for evaluation and support.  5. Neuropsych: This patient is capable of making decisions on his own behalf.  6. Skin/Wound Care: No problems noted. Only mild abrasion to left elbow Continue to monitor for now.  7. ABLA: Due to trauma--hgb 13.9--->12.4. Still 12.4 8. Acute on chronic renal insufficiency: Continue to encourage po fluid intake--BUN down to 23. Cr improved off HCTZ and Ace 2.02--->1.78 >>1.31 9. HTN: Monitor BP for recurrent orthostatic changes/dizziness. Allow some degree of hypertension.  10. Constipation: Ebowels moved this am cont current Rx Miralax   LOS (Days) 6 A FACE TO FACE EVALUATION WAS PERFORMED  Jernee Murtaugh T 12/10/2013, 8:27 AM

## 2013-12-10 NOTE — Progress Notes (Signed)
Recreational Therapy Session Note  Patient Details  Name: Alexander Cox MRN: [id number] Date of Birth: 12/25/1945 Today's Date: 12/10/2013  Met with pt & wife to discuss TR services on 7/28, no formal TR treatment implemented due to short LOS.  Provided use of aromatherapy in room today per team request for assistance with anxiety management.  Reviewed use of essential oils including potential side effects/complications, pt & wife agreeable.  2 drops of lavender oil applied to gauze pad and left in room.  Team made aware. SIMPSON,LISA 12/10/2013, 3:42 PM  

## 2013-12-10 NOTE — Progress Notes (Signed)
Occupational Therapy Session Notes  Patient Details  Name: Alexander Cox MRN: [id number] Date of Birth: Oct 01, 1945  Today's Date: 12/10/2013  Short Term Goals: Week 1:  OT Short Term Goal 1 (Week 1): Pt will tolerate bathing at shower level with TSLO on for 15 min  OT Short Term Goal 2 (Week 1): Pt will bathe with min A with AE prn at shower level  OT Short Term Goal 3 (Week 1): Pt will recall 3/3 back precautions functionally with min questioning cuing OT Short Term Goal 4 (Week 1): Pt will be able to instruct caregiver how to don brace properly with min questioning cuing OT Short Term Goal 5 (Week 1): Pt will transfer to toilet/ BSC with min A   Skilled Therapeutic Interventions/Progress Updates:   Session #1 0900-0930 - 30 Minutes 0945-1005 - 20 Minutes Missed 10 minutes secondary to pain and hallucinations (PA in room) Individual Therapy Patient with increased pain and hallucinations this am, PA made aware Patient received supine in bed with TLSO donned and wife engaging patient in BLE exercises. While supine in bed, patient with hallucinations of ants crawling all over the walls. Therapist and wife re-directed patient. Patient then sat EOB and wife assisted with donning of bilateral socks and shoes; therapist encouraged wife to purchase a hip kit to increase patient's independence with LB ADLs. Patient can cross legs when pain is decreased, but unable when pain increased. Patient stood with rollator and ambulated > sink side. Patient required moderate multimodal cues for safety with rollator this am. Patient sat at sink on rollator for grooming task of brushing teeth, this task simulated like at home (if patient uses rollator for home use). Patient with increased pain while seated on rollator and would weight shift>left side to relieve some of the pain. Patient worked on standing in attempt to walk out of room, but due to increased pain, patient was unable to start functional ambulation  and had to sit on bed. While seated EOB, patient perseverated on seeing ants crawling all over the walls. Therapist notified PA of this and PA present to discuss medication management with wife and patient. Therefore, patient missed 10 Minutes of skilled therapy. Once PA left, patient willing to perform in additional therapy. Patient engaged in bed mobility to get OOB and performed functional ambulation activity using RW. Patient with increased independence with RW, recommending RW vs rollator. At end of session, left patient seated in w/c with wife present.   Session #2 [id number] - 70 Minutes Made up 10 minutes secondary to missed time earlier Individual Therapy Patient with increased pain during activity/mobility, RN aware Patient received supine in bed. Patient engaged in bed mobility, sat EOB, and donned bilateral shoes by crossing legs. Patient stood with RW and worked on functional ambulation. During this session, focused skilled intervention on functional mobility with RW, side-stepping with RW, tub/shower transfer, sit<>stands, NuStep on level 3 for ~5 minutes, supine<>sit, education > patient & wife regarding d/c planning, pain management, and overall activity tolerance/endurance. Patient with decreased hallucination during this session.   *Downgraded goals to an overall supervision level secondary to pain and hallucinations, both patient and wife aware.   *Recommending tub transfer bench and BSC for home use.   Precautions:  Precautions Precautions: Back Precaution Comments: no BAT  Required Braces or Orthoses: Spinal Brace Spinal Brace: Thoracolumbosacral orthotic;Applied in supine position Restrictions Weight Bearing Restrictions: No  See FIM for current functional status  Kyrstan Gotwalt 12/10/2013, 7:24 AM

## 2013-12-10 NOTE — Progress Notes (Signed)
Social Work Discharge Note  The overall goal for the admission was met for:   Discharge location: Yes - home  Length of Stay: Yes -10 days  Discharge activity level: Yes - modified independent with supervision for bathing/dressing and assistance with donning brace  Home/community participation: Yes  Services provided included: MD, RD, PT, OT, RN, Pharmacy and [place]: Private Insurance: [facility] Medicare  Follow-up services arranged: Home Health: [facility] for PT and OT, DME: tub transfer bench and bedside commode from [place] and Patient/Family has no preference for HH/DME agencies  Comments (or additional information):  Patient/Family verbalized understanding of follow-up arrangements: Yes  Individual responsible for coordination of the follow-up plan: pt and his wife  Confirmed correct DME delivered: Trey Sailors 12/10/2013    [place]att, Silvestre Mesi

## 2013-12-11 ENCOUNTER — Inpatient Hospital Stay (HOSPITAL_COMMUNITY): Payer: Medicare Other

## 2013-12-11 ENCOUNTER — Inpatient Hospital Stay (HOSPITAL_COMMUNITY): Payer: Self-pay | Admitting: Occupational Therapy

## 2013-12-11 ENCOUNTER — Inpatient Hospital Stay (HOSPITAL_COMMUNITY): Payer: Self-pay | Admitting: Physical Therapy

## 2013-12-11 DIAGNOSIS — IMO0002 Reserved for concepts with insufficient information to code with codable children: Secondary | ICD-10-CM

## 2013-12-11 DIAGNOSIS — N289 Disorder of kidney and ureter, unspecified: Secondary | ICD-10-CM

## 2013-12-11 DIAGNOSIS — I959 Hypotension, unspecified: Secondary | ICD-10-CM

## 2013-12-11 DIAGNOSIS — W19XXXA Unspecified fall, initial encounter: Secondary | ICD-10-CM

## 2013-12-11 DIAGNOSIS — S2249XA Multiple fractures of ribs, unspecified side, initial encounter for closed fracture: Secondary | ICD-10-CM

## 2013-12-11 DIAGNOSIS — S22009A Unspecified fracture of unspecified thoracic vertebra, initial encounter for closed fracture: Secondary | ICD-10-CM

## 2013-12-11 DIAGNOSIS — Z5189 Encounter for other specified aftercare: Secondary | ICD-10-CM

## 2013-12-11 LAB — URINE CULTURE
Colony Count: NO GROWTH
Culture: NO GROWTH

## 2013-12-11 MED ORDER — NORTRIPTYLINE HCL 10 MG PO CAPS
10.0000 mg | ORAL_CAPSULE | Freq: Every day | ORAL | Status: DC
  Administered 2013-12-11 – 2013-12-13 (×3): 10 mg via ORAL
  Filled 2013-12-11 (×4): qty 1

## 2013-12-11 MED ORDER — OXYCODONE HCL 5 MG PO TABS
5.0000 mg | ORAL_TABLET | ORAL | Status: DC | PRN
  Administered 2013-12-14: 10 mg via ORAL
  Filled 2013-12-11: qty 2

## 2013-12-11 MED ORDER — OXYCODONE HCL 5 MG PO TABS
10.0000 mg | ORAL_TABLET | Freq: Once | ORAL | Status: AC
  Administered 2013-12-11: 10 mg via ORAL
  Filled 2013-12-11: qty 2

## 2013-12-11 MED ORDER — BACLOFEN 5 MG HALF TABLET
5.0000 mg | ORAL_TABLET | Freq: Three times a day (TID) | ORAL | Status: DC
  Administered 2013-12-11 – 2013-12-14 (×9): 5 mg via ORAL
  Filled 2013-12-11 (×12): qty 1

## 2013-12-11 MED ORDER — PREDNISONE 20 MG PO TABS
20.0000 mg | ORAL_TABLET | Freq: Three times a day (TID) | ORAL | Status: DC
  Administered 2013-12-11 – 2013-12-13 (×8): 20 mg via ORAL
  Filled 2013-12-11 (×12): qty 1

## 2013-12-11 NOTE — Progress Notes (Signed)
IR aware of request for L5-S1 nerve root block. Chart and imaging reviewed by Dr. Barbie Banner Pt received Lovenox last pm at 9:00pm Needs to be off Lovenox 24hrs in order to do injection safely. Per Dr. Barbie Banner, procedure can be done Monday if Lovenox is held Sunday.  Ascencion Dike PA-C Interventional Radiology 12/11/2013 9:48 AM

## 2013-12-11 NOTE — Progress Notes (Signed)
Physical Therapy Session Note  Patient Details  Name: Alexander Cox MRN: [id number] Date of Birth: 1946/03/07  Today's Date: 12/11/2013 Pt. Missed 30 minutes ([id number]) of physical therapy due to sedation; PA ordered pain medication at 4am due to patient not sleeping, and has advised therapist that physical therapy session(s) should not occur this morning.  Pt. Found sleeping in bed with HOB elevated and was unable to be aroused by therapist; PA consulted on patient's condition. Bed alarm was armed and patient remained asleep. Spouse arrived at 0807.  Juluis Mire 12/11/2013, 8:14 AM

## 2013-12-11 NOTE — Progress Notes (Signed)
Physical Therapy Discharge Summary  Patient Details  Name: Alexander Cox MRN: [id number] Date of Birth: [birth year]  Today's Date: 12/11/2013 Time:  Time Calculation (min):   Patient has met 10 of 10 long term goals due to improved activity tolerance, improved balance, increased strength and decreased pain.  Patient to discharge at an ambulatory level Modified Independent with TLSO and RW for longer distances; pt has been cleared to ambulate without AD short, household distances.   Patient's care partner is independent to provide the necessary supervision assistance at discharge.  Pt initially to D/C on Saturday 12/12/13 but secondary to medication complications D/C held until 12/14/13.  Reasons goals not met: All goals met and exceeded  Recommendation:  Patient will benefit from ongoing skilled PT services in home health setting to continue to advance safe functional mobility, address ongoing impairments in LE and core weakness, impaired activity tolerance/endurance, pain, balance, gait, and minimize fall risk.  Equipment: RW  Reasons for discharge: treatment goals met and discharge from hospital  Patient/family agrees with progress made and goals achieved: Yes  PT Discharge Precautions/Restrictions  TLSO, don/doff in supine Vital Signs Therapy Vitals Temp: 97.5 F (36.4 C) Temp src: Oral Pulse Rate: 77 Resp: 16 BP: 155/90 mmHg Patient Position (if appropriate): Sitting Oxygen Therapy SpO2: 98 % Pain Pain Assessment Pain Assessment: 0-10 Pain Score: 3  Pain Type: Acute pain Pain Location: Ankle Pain Orientation: Right Pain Descriptors / Indicators: Radiating Pain Onset: On-going Patients Stated Pain Goal: 0 Pain Intervention(s): RN made aware Multiple Pain Sites: Yes 2nd Pain Site Pain Score: 4 Pain Type: Acute pain Pain Location: Hip Pain Orientation: Right Pain Descriptors / Indicators: Aching;Radiating Pain Frequency: Constant Pain Onset: On-going Patient's  Stated Pain Goal: 0 Pain Intervention(s): RN made aware Sensation Sensation Light Touch: Impaired Detail Light Touch Impaired Details: Impaired RLE Stereognosis: Not tested Hot/Cold: Not tested Proprioception: Impaired Detail Proprioception Impaired Details: Impaired RLE Additional Comments: nerve pain between R ankle/foot and increased numbness/tingling RLE with standing/activity Coordination Gross Motor Movements are Fluid and Coordinated: Yes Motor  Motor Motor - Discharge Observations: Generalized weakness RLE with radicular pain and spasms  Mobility Bed Mobility Bed Mobility: Rolling Right;Rolling Left;Sit to Supine;Supine to Sit Rolling Right: 6: Modified independent (Device/Increase time) Rolling Left: 6: Modified independent (Device/Increase time) Supine to Sit: 6: Modified independent (Device/Increase time) Sit to Supine: 6: Modified independent (Device/Increase time) Transfers Stand Pivot Transfers: 6: Modified independent (Device/Increase time);Other (comment) (once orthosis donned in supine and with RW) Locomotion  Ambulation Ambulation/Gait Assistance: 6: Modified independent (Device/Increase time) Ambulation Distance (Feet): 150 Feet Gait Gait Pattern: Impaired Gait Pattern: Step-through pattern;Decreased stance time - right;Decreased stride length;Decreased weight shift to right;Antalgic;Trunk flexed;Narrow base of support Stairs / Scientist, research (life sciences) Assistance: 5: Modified independent Stair Management Technique: Two rails;Forwards;Step to pattern Number of Stairs: 10-15 Height of Stairs: 6 Product manager Assistance: 6: Modified independent (Device/Increase time) Environmental health practitioner: Both upper extremities Wheelchair Parts Management: Independent Distance: 150  Trunk/Postural Assessment  Cervical Assessment Cervical Assessment: Within Functional Limits Thoracic Assessment Thoracic Assessment:  (in TLSO) Lumbar  Assessment Lumbar Assessment:  (in TLSO)  Balance Static Sitting Balance Static Sitting - Balance Support: No upper extremity supported Static Sitting - Level of Assistance: 6: Modified independent (Device/Increase time) Dynamic Sitting Balance Dynamic Sitting - Balance Support: Right upper extremity supported;Left upper extremity supported Dynamic Sitting - Level of Assistance: 6: Modified independent (Device/Increase time) Static Standing Balance Static Standing - Balance Support: Right upper extremity supported;Left upper extremity supported  Static Standing - Level of Assistance: 6: Modified independent (Device/Increase time) Dynamic Standing Balance Dynamic Standing - Balance Support: Right upper extremity supported;Left upper extremity supported Dynamic Standing - Level of Assistance: 6: Modified independent (Device/Increase time) Extremity Assessment  RLE Strength RLE Overall Strength: Deficits;Due to pain RLE Overall Strength Comments: 4-/5 LLE Assessment LLE Assessment: Within Functional Limits  See FIM for current functional status  Alexander Cox [facility] 12/11/2013, 4:33 PM

## 2013-12-11 NOTE — Progress Notes (Signed)
Occupational Therapy Session Note  Patient Details  Name: Alexander Cox MRN: [id number] Date of Birth: 06-22-1945  Today's Date: 12/11/2013 Time: 8144-8185 Time Calculation (min): 60 min  Short Term Goals: Week 1:  OT Short Term Goal 1 (Week 1): Pt will tolerate bathing at shower level with TSLO on for 15 min  OT Short Term Goal 2 (Week 1): Pt will bathe with min A with AE prn at shower level  OT Short Term Goal 3 (Week 1): Pt will recall 3/3 back precautions functionally with min questioning cuing OT Short Term Goal 4 (Week 1): Pt will be able to instruct caregiver how to don brace properly with min questioning cuing OT Short Term Goal 5 (Week 1): Pt will transfer to toilet/ BSC with min A   Skilled Therapeutic Interventions/Progress Updates:    Engaged in functional ambulation with RW with focus activity tolerance, safety awareness, dynamic standing balance during home management and self-care tasks in ADL apartment. Addressed bed mobility and bathroom transfers with pt requiring cues initially for back precautions with bed mobility.  Supervision with toilet and tub/shower transfer, discussed procedure for bathing at shower level and changing TLSO pads at bed level.  Obtained and transported items in kitchen and ADL apartment with use of RW and reacher while adhering to back precautions and safety.  Performed furniture transfers at supervision level.  Pt's wife present and provided appropriate level of supervision throughout session.  Therapy Documentation Precautions:  Precautions Precautions: Back Precaution Comments: no BAT  Required Braces or Orthoses: Spinal Brace Spinal Brace: Thoracolumbosacral orthotic;Applied in supine position Restrictions Weight Bearing Restrictions: No General:   Vital Signs: Therapy Vitals Temp: 97.5 F (36.4 C) Temp src: Oral Pulse Rate: 77 Resp: 16 BP: 155/90 mmHg Patient Position (if appropriate): Sitting Oxygen Therapy SpO2: 98  % Pain: Pain Assessment Pain Assessment: 0-10 Pain Score: 3  Pain Type: Acute pain Pain Location: Ankle Pain Orientation: Right Pain Descriptors / Indicators: Radiating Pain Onset: On-going Patients Stated Pain Goal: 0 Pain Intervention(s): RN made aware Multiple Pain Sites: Yes 2nd Pain Site Pain Score: 4 Pain Type: Acute pain Pain Location: Hip Pain Orientation: Right Pain Descriptors / Indicators: Aching;Radiating Pain Frequency: Constant Pain Onset: On-going Patient's Stated Pain Goal: 0 Pain Intervention(s): RN made aware ADL: ADL ADL Comments: see FIM  See FIM for current functional status  Therapy/Group: Individual Therapy  Simonne Come 12/11/2013, 3:17 PM

## 2013-12-11 NOTE — Progress Notes (Signed)
Physical Therapy Session Note  Patient Details  Name: Alexander Cox MRN: [id number] Date of Birth: 12/30/1945  Today's Date: 12/11/2013 Time: 1250-1400 Time Calculation (min): 70 min  Short Term Goals: Week 1:  PT Short Term Goal 1 (Week 1): Pt will perform bed mobility with min A, HOB flat and no rail.  PT Short Term Goal 2 (Week 1): Pt will perform bed <> w/c transfers with supervision.  PT Short Term Goal 3 (Week 1): Pt will ambulate 30 ft using RW and min A.  PT Short Term Goal 4 (Week 1): Pt will negotiate up/down 5 stairs using 2 rails and min A.  PT Short Term Goal 5 (Week 1): Pt will propel w/c x 150 ft with supervision.  Skilled Therapeutic Interventions/Progress Updates:  Pt. Received seated in w/c with guests; pt agreeable to therapy. Pt. States 3/10 pain at start of session with RN providing scheduled medication at 1300.  2:1 (spouse present for family education/skill acquisition() Treatment focused on transfers (stairs, car, variable surfaces) gait tolerance, w/c mgmt, and dynamic balance exercises. Pt. Assist level is (S) with RW with w/c follow secondary to pain. Treatment consisted of car transfer x 4  With RW at various heights with demonstration, and adherence to BAT precautions. Stairs 8 steps up/down with 2 rails at min guard assist; therapist demonstration and pt teach-back for proper sequencing for pain control and safety. See ambulation distances below; Pt. Required several therapeutic rest/recovery breaks due to increased pain. Pt. Performed therapeutic exercises with dynamic balance component at min A for safety and postural control; Pt. Single leg stance LLE with RLE circumducting clockwise/anti-clockwise for multiple trials to tolerance. Pt. Response appropriate, with stated pain goals, that facilitated therapeutic participation.   Pain: see below  Pt. Condition post therapy session: seated in w/c with orthosis donned with all needs within reach. Spouse present.  Pain rated at 5/10 post tx session.  Therapy Documentation Precautions:  Precautions Precautions: Back Precaution Comments: no BAT  Required Braces or Orthoses: Spinal Brace Spinal Brace: Thoracolumbosacral orthotic;Applied in supine position Restrictions Weight Bearing Restrictions: No Pain: Pain Assessment Pain Assessment: 0-10 Pain Score: 3  Pain Type: Acute pain Pain Location: Ankle Pain Orientation: Right Pain Descriptors / Indicators: Radiating Pain Onset: On-going Patients Stated Pain Goal: 0 Pain Intervention(s): RN made aware Multiple Pain Sites: Yes 2nd Pain Site Pain Score: 4 Pain Type: Acute pain Pain Location: Hip Pain Orientation: Right Pain Descriptors / Indicators: Aching;Radiating Pain Frequency: Constant Pain Onset: On-going Patient's Stated Pain Goal: 0 Pain Intervention(s): RN made aware Mobility: Ambulated 50 feet, 100 feet, and 170 feet with RW and orthosis donned. Locomotion : Ambulation Ambulation/Gait Assistance: 5: Supervision Wheelchair Mobility Distance: 250   See FIM for current functional status  Therapy/Group: Individual Therapy  Juluis Mire 12/11/2013, 3:36 PM

## 2013-12-11 NOTE — Progress Notes (Signed)
Occupational Therapy Session Note  Patient Details  Name: Alexander Cox MRN: [id number] Date of Birth: Jul 12, 1945  Today's Date: 12/11/2013 Time: 2248-2500 Time Calculation (min): 35 min  Short Term Goals: Week 1:  OT Short Term Goal 1 (Week 1): Pt will tolerate bathing at shower level with TSLO on for 15 min  OT Short Term Goal 2 (Week 1): Pt will bathe with min A with AE prn at shower level  OT Short Term Goal 3 (Week 1): Pt will recall 3/3 back precautions functionally with min questioning cuing OT Short Term Goal 4 (Week 1): Pt will be able to instruct caregiver how to don brace properly with min questioning cuing OT Short Term Goal 5 (Week 1): Pt will transfer to toilet/ BSC with min A   Skilled Therapeutic Interventions/Progress Updates:    1:1 Pt only oriented to self (not place, situation or time). Pt difficult to fully understand and had difficulty keeping eyes open during session. Pt's wife present and reports this decr cognition and awareness has been ongoing the last few days limiting his ability to participate fully and safely in therapy.  Pt unable to state his back precautions and demonstrated no knowledge of back injury. Focused on education with wife on performing bathing and dressing at bed level, donning brace and safe bed mobility. Pt required total A for bathing and dressing in supine in t[he bed today with wife's assistance. TLSO donning and pt positioned in bed in order to participate in eating his breakfast- pt required total A to setup tray and needed encouragement to eat. Recommended pt stay in brace this am (now that he is awake) to maintain back precautions.   D/C has been deferred to possibly Monday due to cognitive decline.   Therapy Documentation Precautions:  Precautions Precautions: Back Precaution Comments: no BAT  Required Braces or Orthoses: Spinal Brace Spinal Brace: Thoracolumbosacral orthotic;Applied in supine position Restrictions Weight Bearing  Restrictions: No General: General Amount of Missed OT Time (min): 25 Minutes Missed Time Reason: due to pain and inability to fully participate due decr cognition and awareness    Pain: Pain Assessment Pain Score: Asleep ADL: ADL ADL Comments: see FIM  See FIM for current functional status  Therapy/Group: Individual Therapy  [name] 12/11/2013, 10:58 AM

## 2013-12-11 NOTE — Progress Notes (Signed)
Subjective/Complaints: Pain still a problem. Had to reduce and hold meds due lethargy. Didn't sleep at all last night as a result   Review of Systems - Negative except as above, bowels moved Objective: Vital Signs: Blood pressure 148/81, pulse 67, temperature 98.2 F (36.8 C), temperature source Oral, resp. rate 18, weight 70.9 kg (156 lb 4.9 oz), SpO2 94.00%. No results found. Results for orders placed during the hospital encounter of 12/04/13 (from the past 72 hour(s))  BASIC METABOLIC PANEL     Status: Abnormal   Collection Time    12/10/13 10:55 AM      Result Value Ref Range   Sodium 136 (*) 137 - 147 mEq/L   Potassium 4.6  3.7 - 5.3 mEq/L   Chloride 98  96 - 112 mEq/L   CO2 22  19 - 32 mEq/L   Glucose, Bld 110 (*) 70 - 99 mg/dL   BUN 23  6 - 23 mg/dL   Creatinine, Ser 1.24  0.50 - 1.35 mg/dL   Calcium 9.1  8.4 - 10.5 mg/dL   GFR calc non Af Amer 58 (*) >90 mL/min   GFR calc Af Amer 68 (*) >90 mL/min   Comment: (NOTE)     The eGFR has been calculated using the CKD EPI equation.     This calculation has not been validated in all clinical situations.     eGFR's persistently <90 mL/min signify possible Chronic Kidney     Disease.   Anion gap 16 (*) 5 - 15  URINALYSIS, ROUTINE W REFLEX MICROSCOPIC     Status: None   Collection Time    12/10/13  4:19 PM      Result Value Ref Range   Color, Urine YELLOW  YELLOW   APPearance CLEAR  CLEAR   Specific Gravity, Urine 1.011  1.005 - 1.030   pH 5.5  5.0 - 8.0   Glucose, UA NEGATIVE  NEGATIVE mg/dL   Hgb urine dipstick NEGATIVE  NEGATIVE   Bilirubin Urine NEGATIVE  NEGATIVE   Ketones, ur NEGATIVE  NEGATIVE mg/dL   Protein, ur NEGATIVE  NEGATIVE mg/dL   Urobilinogen, UA 0.2  0.0 - 1.0 mg/dL   Nitrite NEGATIVE  NEGATIVE   Leukocytes, UA NEGATIVE  NEGATIVE   Comment: MICROSCOPIC NOT DONE ON URINES WITH NEGATIVE PROTEIN, BLOOD, LEUKOCYTES, NITRITE, OR GLUCOSE <1000 mg/dL.     HEENT: normal Cardio: RRR and no murmur Resp: CTA B/L  and unlabored GI: BS positive and NT,ND Extremity:  No Edema Skin:   Intact Neuro: Alert/Oriented and Cranial Nerve II-XII normal Musc/Skel:  Other no tenderness over R hip, no pain with hip ROM, - SLR Gen: sedated, difficult to arouse Motor strength is 5/5 bilateral deltoid, bicep, tricep, grip  3/5 in the right hip flexor knee extensor 4-/5 right ankle dorsiflexor plantar flexor  4/5 left hip flexor 4/5 knee extensor 5/5 ankle dorsiflexor plantar flexor    Assessment/Plan: 1. Functional deficits secondary to T 11 burst fx, R L4/5 Radic which require 3+ hours per day of interdisciplinary therapy in a comprehensive inpatient rehab setting. Physiatrist is providing close team supervision and 24 hour management of active medical problems listed below. Physiatrist and rehab team continue to assess barriers to discharge/monitor patient progress toward functional and medical goals.  Will need to hold dc today given pain and sedation   FIM: FIM - Bathing Bathing Steps Patient Completed: Chest;Right Arm;Left Arm;Abdomen;Front perineal area;Buttocks;Right upper leg;Left upper leg Bathing: 4: Min-Patient completes 8-9 [id number]  parts or 75+ percent  FIM - Upper Body Dressing/Undressing Upper body dressing/undressing steps patient completed: Thread/unthread left sleeve of pullover shirt/dress;Thread/unthread right sleeve of pullover shirt/dresss;Put head through opening of pull over shirt/dress;Pull shirt over trunk Upper body dressing/undressing: 5: Supervision: Safety issues/verbal cues FIM - Lower Body Dressing/Undressing Lower body dressing/undressing steps patient completed: Thread/unthread right pants leg;Thread/unthread left pants leg;Pull pants up/down;Don/Doff right sock;Don/Doff left sock;Don/Doff right shoe;Don/Doff left shoe Lower body dressing/undressing: 4: Min-Patient completed 75 plus % of tasks (min assist during standing secondary to decreased dynamic standing  balance/tolerance/endurance)  FIM - Toileting Toileting Assistive Devices: Grab bar or rail for support Toileting: 1: Total-Patient completed zero steps, helper did all 3  FIM - Radio producer Devices: Walker;Grab bars;Bedside commode Toilet Transfers: 4-To toilet/BSC: Min A (steadying Pt. > 75%);4-From toilet/BSC: Min A (steadying Pt. > 75%)  FIM - Bed/Chair Transfer Bed/Chair Transfer Assistive Devices: Bed rails;Walker;Arm rests Bed/Chair Transfer: 0: Activity did not occur  FIM - Locomotion: Wheelchair Distance: 150 Locomotion: Wheelchair: 0: Activity did not occur FIM - Locomotion: Ambulation Locomotion: Ambulation Assistive Devices: Administrator Ambulation/Gait Assistance: 5: Supervision Locomotion: Ambulation: 2: Travels 50 - 149 ft with supervision/safety issues  Comprehension Comprehension Mode: Auditory Comprehension: 5-Follows basic conversation/direction: With extra time/assistive device  Expression Expression Mode: Verbal Expression: 5-Expresses basic needs/ideas: With extra time/assistive device  Social Interaction Social Interaction: 6-Interacts appropriately with others with medication or extra time (anti-anxiety, antidepressant).  Problem Solving Problem Solving: 5-Solves basic 90% of the time/requires cueing < 10% of the time  Memory Memory: 5-Recognizes or recalls 90% of the time/requires cueing < 10% of the time  Medical Problem List and Plan:  1. Functional deficits secondary to Multitrauma with T11 burst fracture as well as spondylolisthesis with right L5 radiculopathy after a fall 11/30/2013  -Apply TLSO in supine  2. DVT Prophylaxis/Anticoagulation: Pharmaceutical: Lovenox  3. Pain Management: sedated from medications---had to hold/reduce meds  -will d/w NS regarding a right transforaminal L5 block 4. Mood: Seems to be upbeat. Will have LCSW follow for evaluation and support.  5. Neuropsych: This patient is capable  of making decisions on his own behalf.  6. Skin/Wound Care: No problems noted. Only mild abrasion to left elbow Continue to monitor for now.  7. ABLA: Due to trauma--hgb 13.9--->12.4. Still 12.4 8. Acute on chronic renal insufficiency: Continue to encourage po fluid intake--BUN down to 23. Cr improved off HCTZ and Ace 2.02--->1.78 >>1.31 9. HTN: Monitor BP for recurrent orthostatic changes/dizziness. Allow some degree of hypertension.  10. Constipation: Ebowels moved this am cont current Rx Miralax   LOS (Days) 7 A FACE TO FACE EVALUATION WAS PERFORMED  SWARTZ,ZACHARY T 12/11/2013, 8:27 AM

## 2013-12-11 NOTE — Progress Notes (Signed)
Physical Therapy Session Note  Patient Details  Name: Alexander Cox MRN: [id number] Date of Birth: March 29, 1946  Today's Date: 12/11/2013 Time: 6004-5997 Time Calculation (min): 43 min  Short Term Goals: Week 1:  PT Short Term Goal 1 (Week 1): Pt will perform bed mobility with min A, HOB flat and no rail.  PT Short Term Goal 2 (Week 1): Pt will perform bed <> w/c transfers with supervision.  PT Short Term Goal 3 (Week 1): Pt will ambulate 30 ft using RW and min A.  PT Short Term Goal 4 (Week 1): Pt will negotiate up/down 5 stairs using 2 rails and min A.  PT Short Term Goal 5 (Week 1): Pt will propel w/c x 150 ft with supervision.  Skilled Therapeutic Interventions/Progress Updates:   Pt disoriented earlier this am but more alert and oriented during this session.  Pt reporting minimal pain but disappointed about D/C being held until Monday.  Pt agreeable to therapy.  Discussed equipment, education and goals completed with pt and wife and areas to continue to focus on.  Pt performed gait x 150' with RW and TLSO and supervision but noted to have increased antalgic gait this am with decreased stance time on RLE, increased trunk flexion/shoulder elevation and narrow BOS.  Pt cued to widen BOS and allowed one sitting rest break secondary to pain 7/10; pain meds requested from RN.  In gym pt performed supine <> sit on flat mat Mod I with TLSO.  Performed RLE stretches, nerve glides, and isometric glute and hip ABD activation for pain management.  Pt reporting 5/10 after stretches.  Performed stair negotiation training; pt performed up/down 5 stairs x 2 reps with 2 rails and supervision but verbal cues needed for safe step to sequence.  Pt performed w/c mobility back to room mod I.    Therapy Documentation Precautions:  Precautions Precautions: Back Precaution Comments: no BAT  Required Braces or Orthoses: Spinal Brace Spinal Brace: Thoracolumbosacral orthotic;Applied in supine  position Restrictions Weight Bearing Restrictions: No Pain: Pain Assessment Pain Score: 0-No pain Pain Type: Neuropathic pain Pain Location: Hip Pain Orientation: Right Pain Descriptors / Indicators: Radiating Pain Onset: With Activity Pain Intervention(s): RN made aware;Other (Comment) (stretching) Mobility: Bed Mobility Bed Mobility: Rolling Right;Rolling Left;Sit to Supine;Supine to Sit Rolling Right: 6: Modified independent (Device/Increase time) Rolling Left: 6: Modified independent (Device/Increase time) Supine to Sit: 6: Modified independent (Device/Increase time) Sit to Supine: 6: Modified independent (Device/Increase time) Transfers Stand Pivot Transfers: 6: Modified independent (Device/Increase time);Other (comment) (once orthosis donned in supine and with RW) Locomotion : Ambulation Ambulation/Gait Assistance: Supervision (Device/Increase time) Ambulation Distance (Feet): 150 Feet Gait Gait Pattern: Impaired Gait Pattern: Step-through pattern;Decreased stance time - right;Decreased stride length;Decreased weight shift to right;Antalgic;Trunk flexed;Narrow base of support Stairs / Scientist, research (life sciences) Assistance: 5: Supervision Stairs Assistance Details: Verbal cues for sequencing Stair Management Technique: Two rails;Forwards;Step to pattern Number of Stairs: 10 Height of Stairs: 6 Product manager Assistance: 6: Modified independent (Device/Increase time) Environmental health practitioner: Both upper extremities Wheelchair Parts Management: Independent Distance: 150   See FIM for current functional status  Therapy/Group: Individual Therapy  [name] [facility] 12/11/2013, 12:18 PM

## 2013-12-12 ENCOUNTER — Inpatient Hospital Stay (HOSPITAL_COMMUNITY): Payer: Medicare Other

## 2013-12-12 ENCOUNTER — Inpatient Hospital Stay (HOSPITAL_COMMUNITY): Payer: Medicare Other | Admitting: *Deleted

## 2013-12-12 NOTE — Progress Notes (Addendum)
Physical Therapy Session Note  Patient Details  Name: Alexander Cox MRN: [id number] Date of Birth: 09/24/1945  Today's Date: 12/12/2013 Time: 0800-0845 Time Calculation (min): 45 min  Short Term Goals: Week 1:  PT Short Term Goal 1 (Week 1): Pt will perform bed mobility with min A, HOB flat and no rail.  PT Short Term Goal 2 (Week 1): Pt will perform bed <> w/c transfers with supervision.  PT Short Term Goal 3 (Week 1): Pt will ambulate 30 ft using RW and min A.  PT Short Term Goal 4 (Week 1): Pt will negotiate up/down 5 stairs using 2 rails and min A.  PT Short Term Goal 5 (Week 1): Pt will propel w/c x 150 ft with supervision. Week 2:     Skilled Therapeutic Interventions/Progress Updates:  Pt. Received seated in w/c with 3/10 pain in R Hip. Pt agreeable to therapy; wife present and participating in "grad day" like activities. TLSO donned prior to session starting.  2:1 (spouse present) Treatment focused on overall functional movements: gait, transfers/balance control, endurance, and safety. Pt. Assist level is (S) with RW. Treatment consisted of gait/ambulation: 100', 136', 95', and 175' with RW and w/c follow for safety. W/C propulsion with BLE 180' at mod-I. Stairs 15 steps with one rail and TLSO donned. Pt. Utilized step-over-step ascending, and step-to descending with min guard assist (new activity in new setting). Car transfer with RW and TLSO BAT precautions x 2 with spouse guiding transfers. Demonstrated standing HS stretches with step with teach-back from pt. And spouse x 5 ea. Pt. Response to therapy was increased compared to previous sessions; pain well managed and spouse demonstrates ability to manage patient's safety and plan appropriately.  Pain: 3/10 post session  Pt. Condition post therapy session: positioned seated in W/C with all needs within reach. SPouse present.  Therapy Documentation Precautions:  Precautions Precautions: Back Precaution Comments: no BAT   Required Braces or Orthoses: Spinal Brace Spinal Brace: Thoracolumbosacral orthotic;Applied in supine position Restrictions Weight Bearing Restrictions: No Pain: Pain Assessment Pain Assessment: 3/10 pain; managed with medications prior to therapy, and rest/repositioning to maintain/decrease current level of pain. Locomotion : Ambulation Ambulation/Gait Assistance: 6: Modified independent (Device/Increase time) Wheelchair Mobility Distance: 250   See FIM for current functional status  Therapy/Group: Individual Therapy  Alexander Cox 12/12/2013, 9:52 AM

## 2013-12-12 NOTE — Progress Notes (Signed)
Subjective/Complaints: Had a much better night. Wife place him in his TLSO which seems to have helped pain too. Got up once on his own without assistance  Review of Systems - Negative except as above, bowels moved Objective: Vital Signs: Blood pressure 132/71, pulse 73, temperature 98.5 F (36.9 C), temperature source Oral, resp. rate 18, weight 70.9 kg (156 lb 4.9 oz), SpO2 98.00%. No results found. Results for orders placed during the hospital encounter of 12/04/13 (from the past 72 hour(s))  BASIC METABOLIC PANEL     Status: Abnormal   Collection Time    12/10/13 10:55 AM      Result Value Ref Range   Sodium 136 (*) 137 - 147 mEq/L   Potassium 4.6  3.7 - 5.3 mEq/L   Chloride 98  96 - 112 mEq/L   CO2 22  19 - 32 mEq/L   Glucose, Bld 110 (*) 70 - 99 mg/dL   BUN 23  6 - 23 mg/dL   Creatinine, Ser 1.24  0.50 - 1.35 mg/dL   Calcium 9.1  8.4 - 10.5 mg/dL   GFR calc non Af Amer 58 (*) >90 mL/min   GFR calc Af Amer 68 (*) >90 mL/min   Comment: (NOTE)     The eGFR has been calculated using the CKD EPI equation.     This calculation has not been validated in all clinical situations.     eGFR's persistently <90 mL/min signify possible Chronic Kidney     Disease.   Anion gap 16 (*) 5 - 15  URINALYSIS, ROUTINE W REFLEX MICROSCOPIC     Status: None   Collection Time    12/10/13  4:19 PM      Result Value Ref Range   Color, Urine YELLOW  YELLOW   APPearance CLEAR  CLEAR   Specific Gravity, Urine 1.011  1.005 - 1.030   pH 5.5  5.0 - 8.0   Glucose, UA NEGATIVE  NEGATIVE mg/dL   Hgb urine dipstick NEGATIVE  NEGATIVE   Bilirubin Urine NEGATIVE  NEGATIVE   Ketones, ur NEGATIVE  NEGATIVE mg/dL   Protein, ur NEGATIVE  NEGATIVE mg/dL   Urobilinogen, UA 0.2  0.0 - 1.0 mg/dL   Nitrite NEGATIVE  NEGATIVE   Leukocytes, UA NEGATIVE  NEGATIVE   Comment: MICROSCOPIC NOT DONE ON URINES WITH NEGATIVE PROTEIN, BLOOD, LEUKOCYTES, NITRITE, OR GLUCOSE <1000 mg/dL.  URINE CULTURE     Status: None    Collection Time    12/10/13  4:19 PM      Result Value Ref Range   Specimen Description URINE, RANDOM     Special Requests NONE     Culture  Setup Time       Value: 12/10/2013 21:57     Performed at [facility]       Value: NO GROWTH     Performed at [facility]   Culture       Value: NO GROWTH     Performed at [facility]   Report Status 12/11/2013 FINAL       HEENT: normal Cardio: RRR and no murmur Resp: CTA B/L and unlabored GI: BS positive and NT,ND Extremity:  No Edema Skin:   Intact Neuro: Alert/Oriented and Cranial Nerve II-XII normal Musc/Skel:  Other no tenderness over R hip, no pain with hip ROM,   Gen: very alert and appropriate--wearing TLSO Motor strength is 5/5 bilateral deltoid, bicep, tricep, grip  3/5 in the right  hip flexor knee extensor 4-/5 right ankle dorsiflexor plantar flexor  4/5 left hip flexor 4/5 knee extensor 5/5 ankle dorsiflexor plantar flexor    Assessment/Plan: 1. Functional deficits secondary to T 11 burst fx, R L4/5 Radic which require 3+ hours per day of interdisciplinary therapy in a comprehensive inpatient rehab setting. Physiatrist is providing close team supervision and 24 hour management of active medical problems listed below. Physiatrist and rehab team continue to assess barriers to discharge/monitor patient progress toward functional and medical goals.  Will need to hold dc today given pain and sedation   FIM: FIM - Bathing Bathing Steps Patient Completed: Chest;Right Arm;Left Arm;Abdomen;Front perineal area;Buttocks;Right upper leg;Left upper leg Bathing: 4: Min-Patient completes 8-9 56f 10 parts or 75+ percent  FIM - Upper Body Dressing/Undressing Upper body dressing/undressing steps patient completed: Thread/unthread left sleeve of pullover shirt/dress;Thread/unthread right sleeve of pullover shirt/dresss;Put head through opening of pull over shirt/dress;Pull shirt over trunk Upper  body dressing/undressing: 5: Supervision: Safety issues/verbal cues FIM - Lower Body Dressing/Undressing Lower body dressing/undressing steps patient completed: Thread/unthread right pants leg;Thread/unthread left pants leg;Pull pants up/down;Don/Doff right sock;Don/Doff left sock;Don/Doff right shoe;Don/Doff left shoe Lower body dressing/undressing: 4: Min-Patient completed 75 plus % of tasks (min assist during standing secondary to decreased dynamic standing balance/tolerance/endurance)  FIM - Toileting Toileting Assistive Devices: Grab bar or rail for support Toileting: 1: Total-Patient completed zero steps, helper did all 3  FIM - Radio producer Devices: Walker;Grab bars;Bedside commode Toilet Transfers: 4-To toilet/BSC: Min A (steadying Pt. > 75%);4-From toilet/BSC: Min A (steadying Pt. > 75%)  FIM - Control and instrumentation engineer Devices: Orthosis Bed/Chair Transfer: 0: Activity did not occur  FIM - Locomotion: Wheelchair Distance: 150 Locomotion: Wheelchair: 6: Travels 150 ft or more, turns around, maneuvers to table, bed or toilet, negotiates 3% grade: maneuvers on rugs and over door sills independently FIM - Locomotion: Ambulation Locomotion: Ambulation Assistive Devices: Walker - Rolling;Orthosis Ambulation/Gait Assistance: 6: Modified independent (Device/Increase time) Locomotion: Ambulation: 5: Travels 150 ft or more with supervision/safety issues  Comprehension Comprehension Mode: Auditory Comprehension: 5-Follows basic conversation/direction: With extra time/assistive device  Expression Expression Mode: Verbal Expression: 5-Expresses basic needs/ideas: With extra time/assistive device  Social Interaction Social Interaction: 6-Interacts appropriately with others with medication or extra time (anti-anxiety, antidepressant).  Problem Solving Problem Solving: 5-Solves basic 90% of the time/requires cueing < 10% of the  time  Memory Memory: 5-Recognizes or recalls 90% of the time/requires cueing < 10% of the time  Medical Problem List and Plan:  1. Functional deficits secondary to Multitrauma with T11 burst fracture as well as spondylolisthesis with right L5 radiculopathy after a fall 11/30/2013  -Apply TLSO in supine  2. DVT Prophylaxis/Anticoagulation: Pharmaceutical: Lovenox  3. Pain Management: meds reduced/adjusted to help with sedation  -Radiology unwilling to do ESI due to lovenox not being held 24 hours  -placed on oral prednisone which seems to be helping---will continue same dosing at least for today  -continue current meds as is  -he may wear his TLSO as much as wants! 4. Mood: egossuport from team/family  5. Neuropsych: This patient is capable of making decisions on his own behalf.  6. Skin/Wound Care: No problems noted. Only mild abrasion to left elbow Continue to monitor for now.  7. ABLA: Due to trauma--hgb 13.9--->12.4. Still 12.4 8. Acute on chronic renal insufficiency: Continue to encourage po fluid intake--BUN down to 23. Cr improved off HCTZ and Ace 2.02--->1.78 >>1.31 9. HTN: Monitor BP for recurrent orthostatic  changes/dizziness. Allow some degree of hypertension.  10. Constipation: improved   LOS (Days) 8 A FACE TO FACE EVALUATION WAS PERFORMED  Adeel Guiffre T 12/12/2013, 8:51 AM

## 2013-12-12 NOTE — Progress Notes (Signed)
Occupational Therapy Session Note  Patient Details  Name: Alexander Cox MRN: [id number] Date of Birth: 09/27/1945  Today's Date: 12/12/2013 Time:  -   1415-1500  (45 min)    Short Term Goals: Week 1:  OT Short Term Goal 1 (Week 1): Pt will tolerate bathing at shower level with TSLO on for 15 min  OT Short Term Goal 2 (Week 1): Pt will bathe with min A with AE prn at shower level  OT Short Term Goal 3 (Week 1): Pt will recall 3/3 back precautions functionally with min questioning cuing OT Short Term Goal 4 (Week 1): Pt will be able to instruct caregiver how to don brace properly with min questioning cuing OT Short Term Goal 5 (Week 1): Pt will transfer to toilet/ BSC with min A  Week 2:     Skilled Therapeutic Interventions/Progress Updates:    Engaged in functional ambulation with RW with focus activity tolerance, safety awareness, dynamic standing balance during self-care tasks in ADL apartment. Addressed donning shoes requiring cues initially for back precautions with not bending oever.  Needed minimal assist with shoes. . Pt. Failed to lock wc brakes with sit to stand but did the rest of the time.  Ambulated for 75-100 feet  x3 with rest breaks prn .  Used nustep for 4 wkld for 5 minutes.  Pt. Left in room with call bell in hand.     Therapy Documentation Precautions:  Precautions Precautions: Back Precaution Comments: no BAT  Required Braces or Orthoses: Spinal Brace Spinal Brace: Thoracolumbosacral orthotic;Applied in supine position Restrictions Weight Bearing Restrictions: No General:   Vital Signs: Pain:  3/10 right hip   ADL: ADL ADL Comments: see FIM        See FIM for current functional status  Therapy/Group: Individual Therapy  Lisa Roca 12/12/2013, 2:29 PM

## 2013-12-13 ENCOUNTER — Inpatient Hospital Stay (HOSPITAL_COMMUNITY): Payer: Self-pay | Admitting: Physical Therapy

## 2013-12-13 ENCOUNTER — Encounter (HOSPITAL_COMMUNITY): Payer: Self-pay | Admitting: Occupational Therapy

## 2013-12-13 NOTE — Progress Notes (Signed)
Occupational Therapy Session Note  Patient Details  Name: Alexander Cox MRN: [id number] Date of Birth: [birth year]  Today's Date: 12/13/2013 Time: 0830-0930 Time Calculation (min): 60 min  Short Term Goals: Week 1:  OT Short Term Goal 1 (Week 1): Pt will tolerate bathing at shower level with TSLO on for 15 min  OT Short Term Goal 2 (Week 1): Pt will bathe with min A with AE prn at shower level  OT Short Term Goal 3 (Week 1): Pt will recall 3/3 back precautions functionally with min questioning cuing OT Short Term Goal 4 (Week 1): Pt will be able to instruct caregiver how to don brace properly with min questioning cuing OT Short Term Goal 5 (Week 1): Pt will transfer to toilet/ BSC with min A   Skilled Therapeutic Interventions/Progress Updates:    Engaged in therapeutic activity with focus on adherence to back precautions and functional mobility. Upon arrival pt seated in w/c reporting already bathed and dressed this AM.  Pt and wife reports he completed all tasks with supervision/setup except donning TLSO, which he is able to instruct wife on how to do.  They also report that he is sleeping in his TLSO as it decreases his pain and allows him to sleep better.  Engaged in functional mobility at w/c level and ambulation with RW on various surfaces to simulate home environment.  Engaged in curb step negotiation and walking across grass outside as he will encounter these situations to access his home and in the community.  Pt's wife present throughout session and providing appropriate cues for RW placement and appropriate speed to increase safety.  Bed mobility completed in ADL apartment with pt with recall and carryover of education from previous session.  Pt and wife report no further questions and ready to d/c home tomorrow.  Therapy Documentation Precautions:  Precautions Precautions: Back Precaution Comments: no BAT  Required Braces or Orthoses: Spinal Brace Spinal Brace: Thoracolumbosacral  orthotic;Applied in supine position Restrictions Weight Bearing Restrictions: No General:   Vital Signs: Therapy Vitals Temp: 98.2 F (36.8 C) Temp src: Oral Pulse Rate: 70 Resp: 18 BP: 132/75 mmHg Patient Position (if appropriate): Lying Oxygen Therapy SpO2: 97 % O2 Device: None (Room air) Pain: Pain Assessment Pain Assessment: No/denies pain ADL: ADL ADL Comments: see FIM  See FIM for current functional status  Therapy/Group: Individual Therapy  Simonne Come 12/13/2013, 10:26 AM

## 2013-12-13 NOTE — Progress Notes (Signed)
Subjective/Complaints: Continues to do well. Had a good night also Review of Systems - Negative except as above, bowels moved  Objective: Vital Signs: Blood pressure 132/75, pulse 70, temperature 98.2 F (36.8 C), temperature source Oral, resp. rate 18, weight 70.9 kg (156 lb 4.9 oz), SpO2 97.00%. No results found. Results for orders placed during the hospital encounter of 12/04/13 (from the past 72 hour(s))  BASIC METABOLIC PANEL     Status: Abnormal   Collection Time    12/10/13 10:55 AM      Result Value Ref Range   Sodium 136 (*) 137 - 147 mEq/L   Potassium 4.6  3.7 - 5.3 mEq/L   Chloride 98  96 - 112 mEq/L   CO2 22  19 - 32 mEq/L   Glucose, Bld 110 (*) 70 - 99 mg/dL   BUN 23  6 - 23 mg/dL   Creatinine, Ser 1.24  0.50 - 1.35 mg/dL   Calcium 9.1  8.4 - 10.5 mg/dL   GFR calc non Af Amer 58 (*) >90 mL/min   GFR calc Af Amer 68 (*) >90 mL/min   Comment: (NOTE)     The eGFR has been calculated using the CKD EPI equation.     This calculation has not been validated in all clinical situations.     eGFR's persistently <90 mL/min signify possible Chronic Kidney     Disease.   Anion gap 16 (*) 5 - 15  URINALYSIS, ROUTINE W REFLEX MICROSCOPIC     Status: None   Collection Time    12/10/13  4:19 PM      Result Value Ref Range   Color, Urine YELLOW  YELLOW   APPearance CLEAR  CLEAR   Specific Gravity, Urine 1.011  1.005 - 1.030   pH 5.5  5.0 - 8.0   Glucose, UA NEGATIVE  NEGATIVE mg/dL   Hgb urine dipstick NEGATIVE  NEGATIVE   Bilirubin Urine NEGATIVE  NEGATIVE   Ketones, ur NEGATIVE  NEGATIVE mg/dL   Protein, ur NEGATIVE  NEGATIVE mg/dL   Urobilinogen, UA 0.2  0.0 - 1.0 mg/dL   Nitrite NEGATIVE  NEGATIVE   Leukocytes, UA NEGATIVE  NEGATIVE   Comment: MICROSCOPIC NOT DONE ON URINES WITH NEGATIVE PROTEIN, BLOOD, LEUKOCYTES, NITRITE, OR GLUCOSE <1000 mg/dL.  URINE CULTURE     Status: None   Collection Time    12/10/13  4:19 PM      Result Value Ref Range   Specimen Description  URINE, RANDOM     Special Requests NONE     Culture  Setup Time       Value: 12/10/2013 21:57     Performed at [facility]       Value: NO GROWTH     Performed at [facility]   Culture       Value: NO GROWTH     Performed at [facility]   Report Status 12/11/2013 FINAL       HEENT: normal Cardio: RRR and no murmur Resp: CTA B/L and unlabored GI: BS positive and NT,ND Extremity:  No Edema Skin:   Intact Neuro: Alert/Oriented and Cranial Nerve II-XII normal Musc/Skel:  Other no tenderness over R hip, no pain with hip ROM,   Gen: very alert and appropriate--wearing TLSO Motor strength is 5/5 bilateral deltoid, bicep, tricep, grip  3/5 in the right hip flexor knee extensor 4-/5 right ankle dorsiflexor plantar flexor  4/5 left hip flexor 4/5 knee  extensor 5/5 ankle dorsiflexor plantar flexor    Assessment/Plan: 1. Functional deficits secondary to T 11 burst fx, R L4/5 Radic which require 3+ hours per day of interdisciplinary therapy in a comprehensive inpatient rehab setting. Physiatrist is providing close team supervision and 24 hour management of active medical problems listed below. Physiatrist and rehab team continue to assess barriers to discharge/monitor patient progress toward functional and medical goals.  Much improved can go home tomorrow  FIM: FIM - Bathing Bathing Steps Patient Completed: Chest;Right Arm;Left Arm;Abdomen;Front perineal area;Buttocks;Right upper leg;Left upper leg Bathing: 4: Min-Patient completes 8-9 44f10 parts or 75+ percent  FIM - Upper Body Dressing/Undressing Upper body dressing/undressing steps patient completed: Thread/unthread left sleeve of pullover shirt/dress;Thread/unthread right sleeve of pullover shirt/dresss;Put head through opening of pull over shirt/dress;Pull shirt over trunk Upper body dressing/undressing: 5: Supervision: Safety issues/verbal cues FIM - Lower Body Dressing/Undressing Lower  body dressing/undressing steps patient completed: Thread/unthread right pants leg;Thread/unthread left pants leg;Pull pants up/down;Don/Doff right sock;Don/Doff left sock;Don/Doff right shoe;Don/Doff left shoe Lower body dressing/undressing: 4: Min-Patient completed 75 plus % of tasks (min assist during standing secondary to decreased dynamic standing balance/tolerance/endurance)  FIM - Toileting Toileting Assistive Devices: Grab bar or rail for support Toileting: 1: Total-Patient completed zero steps, helper did all 3  FIM - TRadio producerDevices: Walker;Grab bars;Bedside commode Toilet Transfers: 4-To toilet/BSC: Min A (steadying Pt. > 75%);4-From toilet/BSC: Min A (steadying Pt. > 75%)  FIM - BControl and instrumentation engineerDevices: Orthosis Bed/Chair Transfer: 0: Activity did not occur  FIM - Locomotion: Wheelchair Distance: 250 Locomotion: Wheelchair: 6: Travels 150 ft or more, turns around, maneuvers to table, bed or toilet, negotiates 3% grade: maneuvers on rugs and over door sills independently FIM - Locomotion: Ambulation Locomotion: Ambulation Assistive Devices: Walker - Rolling;Orthosis Ambulation/Gait Assistance: 6: Modified independent (Device/Increase time) Locomotion: Ambulation: 5: Travels 150 ft or more with supervision/safety issues  Comprehension Comprehension Mode: Auditory Comprehension: 5-Understands complex 90% of the time/Cues < 10% of the time  Expression Expression Mode: Verbal Expression: 6-Expresses complex ideas: With extra time/assistive device  Social Interaction Social Interaction: 6-Interacts appropriately with others with medication or extra time (anti-anxiety, antidepressant).  Problem Solving Problem Solving: 5-Solves basic 90% of the time/requires cueing < 10% of the time  Memory Memory: 5-Recognizes or recalls 90% of the time/requires cueing < 10% of the time  Medical Problem List and Plan:   1. Functional deficits secondary to Multitrauma with T11 burst fracture as well as spondylolisthesis with right L5 radiculopathy after a fall 11/30/2013  -Apply TLSO in supine  2. DVT Prophylaxis/Anticoagulation: Pharmaceutical: Lovenox  3. Pain Management: meds reduced/adjusted to help with sedation  -Radiology unwilling to do ESI due to lovenox not being held 24 hours  -continue prednisone 217mTID---will slowly taper upon discharge  -continue current meds as is  -he may wear his TLSO as much as wants! 4. Mood: egossuport from team/family  5. Neuropsych: This patient is capable of making decisions on his own behalf.  6. Skin/Wound Care: No problems noted. Only mild abrasion to left elbow Continue to monitor for now.  7. ABLA: Due to trauma--hgb 13.9--->12.4. Still 12.4 8. Acute on chronic renal insufficiency: Continue to encourage po fluid intake--BUN down to 23. Cr improved off HCTZ and Ace 2.02--->1.78 >>1.31 9. HTN: Monitor BP for recurrent orthostatic changes/dizziness. Allow some degree of hypertension.  10. Constipation: improved   LOS (Days) 9 A FACE TO FACE EVALUATION WAS PERFORMED  SWARTZ,ZACHARY T 12/13/2013,  8:21 AM

## 2013-12-13 NOTE — Progress Notes (Signed)
Physical Therapy Session Note  Patient Details  Name: Alexander Cox MRN: [id number] Date of Birth: August 13, 1945  Today's Date: 12/13/2013 Time: 2778-2423 and 5361-4431 Time Calculation (min): 60 min and 48 minutes  Short Term Goals: Week 1:  PT Short Term Goal 1 (Week 1): Pt will perform bed mobility with min A, HOB flat and no rail.  PT Short Term Goal 1 - Progress (Week 1): Met PT Short Term Goal 2 (Week 1): Pt will perform bed <> w/c transfers with supervision.  PT Short Term Goal 2 - Progress (Week 1): Met PT Short Term Goal 3 (Week 1): Pt will ambulate 30 ft using RW and min A.  PT Short Term Goal 3 - Progress (Week 1): Met PT Short Term Goal 4 (Week 1): Pt will negotiate up/down 5 stairs using 2 rails and min A.  PT Short Term Goal 4 - Progress (Week 1): Met PT Short Term Goal 5 (Week 1): Pt will propel w/c x 150 ft with supervision. PT Short Term Goal 5 - Progress (Week 1): Met  Skilled Therapeutic Interventions/Progress Updates:   Pt more alert and clear today; received in w/c, wife present.  Pt reporting no pain at rest.  Pt and wife reporting that pt has been sleeping in the TLSO with improved rest at night. Performed ambulation x 150' with RW and TLSO mod I with decreased dependence on UE and decreased antalgic gait today.  Transitioned sit > supine log roll mod I.  Performed NMR in supine, see below.  Transitioned supine > sit mod I.  Performed stair negotiation training up/down 15 stairs with 2 rails with pt verbalizing and return demonstrating safe step to sequence mod I.  Performed dynamic standing balance training with focus on activation of TA in standing during dynamic UE movements in various planes of motion reaching and retrieving basketball + mini squats.  Also performed dynamic balance training with R and L lateral stepping/weight shifting without UE support but min A for balance.  Returned to room in w/c with pt performing propulsion mod I 150'.    PM session: pt received  in w/c reporting minimal pain.  Pt made Mod I in room for ambulation and transfers with TLSO and RW.  Pt transferred outside in w/c total A.  Pt performed gait training outside in community environment with focus on gait uphill/downhill on long ramp, over uneven cement/paved surfaces, up/down curb, over compliant grassy terrain all with supervision but intermittent verbal cues for safety and sequencing with RW.  Also performed gait training x 50' x 2 without use of RW progressing from requiring min A to supervision with pt reporting decrease in pain during gait without AD.  Pt required frequent sitting rest breaks secondary to pain, especially after gait uphill.  Back on unit pt continued gait training in controlled environment and room without AD with supervision.  Discussed with patient and wife allowing pt to ambulate in the home without the RW as long as his pain is controlled but may need the RW in the community for longer distances and uneven surfaces in the yard.  Pt and wife agreed; both pleased with pt progress and are looking forwards to D/C tomorrow.    Therapy Documentation Precautions:  Precautions Precautions: Back Precaution Comments: no BAT  Required Braces or Orthoses: Spinal Brace Spinal Brace: Thoracolumbosacral orthotic;Applied in supine position Restrictions Weight Bearing Restrictions: No Pain: Pain Assessment Pain Assessment: No/denies pain Pain Score: 3  Pain Type: Neuropathic pain Pain Location:  Hip Pain Orientation: Right Pain Descriptors / Indicators: Sore Pain Onset: On-going Pain Intervention(s): Therapeutic touch;Repositioned;Rest Locomotion : Ambulation Ambulation/Gait Assistance: 6: Modified independent (Device/Increase time) Wheelchair Mobility Distance: 150  Other Treatments: Treatments Neuromuscular Facilitation: Activity to increase motor control;Activity to increase timing and sequencing;Activity to increase grading with focus on TA activation and  maintaining activation during chin tuck, and 10 reps each isometric hip IR/ADD with towel squeezes and 10 reps isometric hip ABD in closed chain.  Pt required multiple rest breaks due to pain in R hip.  Pt does report pain is no longer radiating but is staying localized to R hip.      See FIM for current functional status  Therapy/Group: Individual Therapy  [name] [facility] 12/13/2013, 11:04 AM

## 2013-12-14 ENCOUNTER — Encounter (HOSPITAL_COMMUNITY): Payer: Self-pay | Admitting: Occupational Therapy

## 2013-12-14 ENCOUNTER — Inpatient Hospital Stay (HOSPITAL_COMMUNITY): Payer: Self-pay | Admitting: Physical Therapy

## 2013-12-14 DIAGNOSIS — I959 Hypotension, unspecified: Secondary | ICD-10-CM

## 2013-12-14 DIAGNOSIS — S22009A Unspecified fracture of unspecified thoracic vertebra, initial encounter for closed fracture: Secondary | ICD-10-CM

## 2013-12-14 DIAGNOSIS — N289 Disorder of kidney and ureter, unspecified: Secondary | ICD-10-CM

## 2013-12-14 DIAGNOSIS — R5383 Other fatigue: Secondary | ICD-10-CM | POA: Diagnosis not present

## 2013-12-14 DIAGNOSIS — Z5189 Encounter for other specified aftercare: Secondary | ICD-10-CM

## 2013-12-14 DIAGNOSIS — W19XXXA Unspecified fall, initial encounter: Secondary | ICD-10-CM

## 2013-12-14 DIAGNOSIS — S2249XA Multiple fractures of ribs, unspecified side, initial encounter for closed fracture: Secondary | ICD-10-CM

## 2013-12-14 DIAGNOSIS — IMO0002 Reserved for concepts with insufficient information to code with codable children: Secondary | ICD-10-CM

## 2013-12-14 HISTORY — DX: Other fatigue: R53.83

## 2013-12-14 MED ORDER — PREDNISONE 10 MG PO TABS: 10.0000 mg | ORAL_TABLET | Freq: Every day | ORAL | Status: DC

## 2013-12-14 MED ORDER — PREDNISONE 20 MG PO TABS
20.0000 mg | ORAL_TABLET | Freq: Two times a day (BID) | ORAL | Status: DC
  Filled 2013-12-14 (×2): qty 1

## 2013-12-14 MED ORDER — PREDNISONE 5 MG PO TABS: 5.0000 mg | ORAL_TABLET | Freq: Every day | ORAL | Status: DC

## 2013-12-14 MED ORDER — NORTRIPTYLINE HCL 10 MG PO CAPS: 10.0000 mg | ORAL_CAPSULE | Freq: Every day | ORAL | Status: DC

## 2013-12-14 MED ORDER — PANTOPRAZOLE SODIUM 40 MG PO TBEC: 40.0000 mg | DELAYED_RELEASE_TABLET | Freq: Every day | ORAL | Status: DC

## 2013-12-14 MED ORDER — GABAPENTIN 300 MG PO CAPS
300.0000 mg | ORAL_CAPSULE | Freq: Two times a day (BID) | ORAL | Status: DC
  Administered 2013-12-14: 300 mg via ORAL
  Filled 2013-12-14 (×3): qty 1

## 2013-12-14 MED ORDER — GABAPENTIN 300 MG PO CAPS: 300.0000 mg | ORAL_CAPSULE | Freq: Two times a day (BID) | ORAL | Status: DC

## 2013-12-14 MED ORDER — BACLOFEN 10 MG PO TABS: 5.0000 mg | ORAL_TABLET | Freq: Three times a day (TID) | ORAL | Status: DC

## 2013-12-14 MED ORDER — PREDNISONE 10 MG PO TABS: 10.0000 mg | ORAL_TABLET | Freq: Two times a day (BID) | ORAL | Status: DC

## 2013-12-14 MED ORDER — TRAMADOL HCL 50 MG PO TABS: 50.0000 mg | ORAL_TABLET | Freq: Four times a day (QID) | ORAL | Status: DC | PRN

## 2013-12-14 MED ORDER — GABAPENTIN 300 MG PO CAPS
300.0000 mg | ORAL_CAPSULE | Freq: Two times a day (BID) | ORAL | Status: DC
  Filled 2013-12-14 (×2): qty 1

## 2013-12-14 MED ORDER — PREDNISONE 20 MG PO TABS
20.0000 mg | ORAL_TABLET | Freq: Two times a day (BID) | ORAL | Status: DC
Start: 2013-12-14 — End: 2014-01-01

## 2013-12-14 MED ORDER — POLYETHYLENE GLYCOL 3350 17 G PO PACK: 17.0000 g | PACK | Freq: Two times a day (BID) | ORAL | Status: DC

## 2013-12-14 NOTE — Progress Notes (Signed)
Patient and caregiver received discharge instructions from [facility] PA-C with verbal understanding. Patient discharged to home with caregiver and belongings. Patient had no c/o pain at discharge.

## 2013-12-14 NOTE — Discharge Summary (Signed)
Physician Discharge Summary  Patient ID: Alexander Cox MRN: [id number] DOB/AGE: 01-13-1946 68 y.o.  Admit date: 12/04/2013 Discharge date: 12/14/2013  Discharge Diagnoses:  Principal Problem:   Traumatic burst fracture of thoracic vertebra Active Problems:   Dehydration   HTN (hypertension)   Acute renal insufficiency   Lethargy due to narcotics   Discharged Condition: Stable.    Labs:  Basic Metabolic Panel:  Recent Labs Lab 12/10/13 1055  NA 136*  K 4.6  CL 98  CO2 22  GLUCOSE 110*  BUN 23  CREATININE 1.24  CALCIUM 9.1    CBC: CBC Latest Ref Rng 12/07/2013 12/02/2013 12/01/2013  WBC 4.0 - 10.5 K/uL 7.0 8.8 9.7  Hemoglobin 13.0 - 17.0 g/dL 12.4(L) 12.8(L) 12.4(L)  Hematocrit 39.0 - 52.0 % 37.7(L) 38.5(L) 36.7(L)  Platelets 150 - 400 K/uL 217 169 199     CBG: No results found for this basename: GLUCAP,  in the last 168 hours  Brief HPI:   Alexander Cox is a 68 y.o. male with history of celiac disease, HTN with recent medication who fell off 20 feet off a  ladder on  11/30/13 due to  recurrent dizziness with LOC. He was found to be dehydrated, hypotensive with SBP in 80's and was treated with IVF.  MRI of spine revealed unstable T-11 burst fracture, non displaced T2 fracture as well as non-displaced fracture of T- 10 posterior elements, L5/S1 anterolisthesis with ligamentous injury and left greater than right low grade paraspinal muscle strain.  Dr. Annette Stable consulted and recommended immobilization with TLSO as patient without neurologic symptoms. 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 He continues to have impairments in mobility and self care and CIR was recommended for follow up therapy.   Hospital Course: Alexander Cox was admitted to rehab 12/04/2013 for inpatient therapies to consist of PT, ST and OT at least  three hours five days a week. Past admission physiatrist, therapy team and rehab RN have worked together to provide customized collaborative inpatient rehab. Blood pressures were monitored every 8 hours and have been well controlled off medications. Check of lytes revealed that acute renal failure has resolved. He was started on OxyContin for more consistent pain management and low dose gabapentin was used for neuropathy RLE. He continue to have complaints of pain with insomnia and medications were titrated upwards but patient developed lethargy with hallucinations due to increase.    Oxycontin was discontinued and radiology was contacted for input on ESI. Due to lovenox as well as upcoming weekend the procedure could not be performed for a few days. Patient was started on prednisone with improvement in symptoms and is to continue slow taper past discharge. Lethargy has resolved and mentation is back to baseline. He has made good progress with improvement in pain levels. He is tolerating use of TLSO and shows good awareness of back precautions. He has progressed to independent level and will continue to receive HHPT and HHOT by [facility] past discharge.    Rehab course: During patient's stay in rehab weekly team conferences were held to monitor patient's progress, set goals and discuss barriers to discharge. Patient has had improvement in activity tolerance, balance, postural control, as well as ability to compensate for deficits. He requires min assist for bathing and lower body dressing. He is able to dress with supervision. He is independent for transfers  and is able to ambulate 150 feet with RW at independent level. Family education was done with wife regarding donning/doffing of brace, HEP, safety as well as need for supervision in community setting.    Disposition: 01-Home or Self Care  Diet: Regular  Special Instructions: 1. Don brace in bed and needs to be on when out of bed. 2. NO  driving till cleared by MD. No strenuous activity.     Medication List    STOP taking these medications       cyclobenzaprine 5 MG tablet  Commonly known as:  FLEXERIL     FISH OIL PO     hydrochlorothiazide 12.5 MG tablet  Commonly known as:  HYDRODIURIL     lisinopril 20 MG tablet  Commonly known as:  PRINIVIL,ZESTRIL      TAKE these medications       aspirin EC 81 MG tablet  Take 81 mg by mouth daily.     baclofen 10 MG tablet  Commonly known as:  LIORESAL  Take 0.5 tablets (5 mg total) by mouth 3 (three) times daily.     calcium carbonate 600 MG Tabs tablet  Commonly known as:  OS-CAL  Take 600 mg by mouth daily with breakfast.     gabapentin 300 MG capsule  Commonly known as:  NEURONTIN  Take 1 capsule (300 mg total) by mouth 2 (two) times daily. For nerve pain     multivitamin with minerals Tabs tablet  Take 1 tablet by mouth daily.     nortriptyline 10 MG capsule  Commonly known as:  PAMELOR  Take 1 capsule (10 mg total) by mouth at bedtime.     omega-3 acid ethyl esters 1 G capsule  Commonly known as:  LOVAZA  Take 1 g by mouth daily.     pantoprazole 40 MG tablet  Commonly known as:  PROTONIX  Take 1 tablet (40 mg total) by mouth daily.     polyethylene glycol packet  Commonly known as:  MIRALAX / GLYCOLAX  Take 17 g by mouth 2 (two) times daily.     predniSONE 20 MG tablet  Commonly known as:  DELTASONE  Take 1 tablet (20 mg total) by mouth 2 (two) times daily with a meal.     predniSONE 10 MG tablet  Commonly known as:  DELTASONE  Take 1 tablet (10 mg total) by mouth 2 (two) times daily with a meal.  Start taking on:  12/18/2013     predniSONE 10 MG tablet  Commonly known as:  DELTASONE  Take 1 tablet (10 mg total) by mouth daily with breakfast.  Start taking on:  12/22/2013     simvastatin 20 MG tablet  Commonly known as:  ZOCOR  Take 20 mg by mouth daily.     traMADol 50 MG tablet---Rx # 120 pills/1 refill  Commonly known as:  ULTRAM   Take 1 tablet (50 mg total) by mouth every 6 (six) hours as needed for moderate pain.     VITAMIN D PO  Take 1,000 Units by mouth daily.       Follow-up Information   Follow up with Meredith Staggers, MD On 01/01/2014. (be there at  10 am  for 10:20 am  appointment)    Specialty:  Physical Medicine and Rehabilitation   Contact information:   [address], [address] [place] [address] [phone]       Follow up with Earnie Larsson A, MD. Call in 2 days. (for  follow up appointment)    Specialty:  Neurosurgery   Contact information:   [address] [place] [address] [phone]       Follow up with Mayra Neer, MD On 12/29/2013. (@ 10:30 AM)    Specialty:  Family Medicine   Contact information:   [address] [place] [address] [phone]       Signed: Bary Leriche 12/14/2013, 6:47 PM

## 2013-12-14 NOTE — Progress Notes (Signed)
Subjective/Complaints: No problems. Pain controlled.  Review of Systems - Negative except as above, bowels moved  Objective: Vital Signs: Blood pressure 137/79, pulse 93, temperature 98.2 F (36.8 C), temperature source Oral, resp. rate 18, weight 70.9 kg (156 lb 4.9 oz), SpO2 99.00%. No results found. No results found for this or any previous visit (from the past 72 hour(s)).   HEENT: normal Cardio: RRR and no murmur Resp: CTA B/L and unlabored GI: BS positive and NT,ND Extremity:  No Edema Skin:   Intact Neuro: Alert/Oriented and Cranial Nerve II-XII normal Musc/Skel:  Other no tenderness over R hip, no pain with hip ROM,   Gen: very alert and appropriate--wearing TLSO Motor strength is 5/5 bilateral deltoid, bicep, tricep, grip  3/5 in the right hip flexor knee extensor 4-/5 right ankle dorsiflexor plantar flexor  4/5 left hip flexor 4/5 knee extensor 5/5 ankle dorsiflexor plantar flexor    Assessment/Plan: 1. Functional deficits secondary to T 11 burst fx, R L4/5 Radic which require 3+ hours per day of interdisciplinary therapy in a comprehensive inpatient rehab setting. Physiatrist is providing close team supervision and 24 hour management of active medical problems listed below. Physiatrist and rehab team continue to assess barriers to discharge/monitor patient progress toward functional and medical goals.  Much improved can go home today.   FIM: FIM - Bathing Bathing Steps Patient Completed: Chest;Right Arm;Left Arm;Abdomen;Front perineal area;Buttocks;Right upper leg;Left upper leg Bathing: 4: Min-Patient completes 8-9 33f 10 parts or 75+ percent  FIM - Upper Body Dressing/Undressing Upper body dressing/undressing steps patient completed: Thread/unthread left sleeve of pullover shirt/dress;Thread/unthread right sleeve of pullover shirt/dresss;Put head through opening of pull over shirt/dress;Pull shirt over trunk Upper body dressing/undressing: 5: Supervision: Safety  issues/verbal cues FIM - Lower Body Dressing/Undressing Lower body dressing/undressing steps patient completed: Thread/unthread right pants leg;Thread/unthread left pants leg;Pull pants up/down;Don/Doff right sock;Don/Doff left sock;Don/Doff right shoe;Don/Doff left shoe Lower body dressing/undressing: 4: Min-Patient completed 75 plus % of tasks (min assist during standing secondary to decreased dynamic standing balance/tolerance/endurance)  FIM - Toileting Toileting Assistive Devices: Grab bar or rail for support Toileting: 1: Total-Patient completed zero steps, helper did all 3  FIM - Radio producer Devices: Walker;Grab bars;Bedside commode Toilet Transfers: 4-To toilet/BSC: Min A (steadying Pt. > 75%);4-From toilet/BSC: Min A (steadying Pt. > 75%)  FIM - Control and instrumentation engineer Devices: Orthosis;Walker Bed/Chair Transfer: 6: Supine > Sit: No assist;6: Sit > Supine: No assist;6: Bed > Chair or W/C: No assist;6: Chair or W/C > Bed: No assist  FIM - Locomotion: Wheelchair Distance: 150 Locomotion: Wheelchair: 6: Travels 150 ft or more, turns around, maneuvers to table, bed or toilet, negotiates 3% grade: maneuvers on rugs and over door sills independently FIM - Locomotion: Ambulation Locomotion: Ambulation Assistive Devices: Walker - Rolling;Orthosis Ambulation/Gait Assistance: 6: Modified independent (Device/Increase time) Locomotion: Ambulation: 6: Travels 150 ft or more with assistive device/no helper  Comprehension Comprehension Mode: Auditory Comprehension: 5-Understands complex 90% of the time/Cues < 10% of the time  Expression Expression Mode: Verbal Expression: 6-Expresses complex ideas: With extra time/assistive device  Social Interaction Social Interaction: 6-Interacts appropriately with others with medication or extra time (anti-anxiety, antidepressant).  Problem Solving Problem Solving: 5-Solves basic 90% of the  time/requires cueing < 10% of the time  Memory Memory: 5-Recognizes or recalls 90% of the time/requires cueing < 10% of the time  Medical Problem List and Plan:  1. Functional deficits secondary to Multitrauma with T11 burst fracture as well as spondylolisthesis  with right L5 radiculopathy after a fall 11/30/2013  -Apply TLSO in supine  2. DVT Prophylaxis/Anticoagulation: Pharmaceutical: Lovenox  3. Pain Management: meds reduced/adjusted to help with sedation  -oral steroids effective  -decrease prednisone to 20mg  BID--continue x4 days then 10mg  bid for 4 days, then 10mg  qd for 4 days an off  -he may wear his TLSO as much as wants! 4. Mood: egossuport from team/family  5. Neuropsych: This patient is capable of making decisions on his own behalf.  6. Skin/Wound Care: No problems noted. Only mild abrasion to left elbow Continue to monitor for now.  7. ABLA: Due to trauma--hgb 13.9--->12.4. Still 12.4 8. Acute on chronic renal insufficiency: Continue to encourage po fluid intake--BUN down to 23. Cr improved off HCTZ and Ace 2.02--->1.78 >>1.31 9. HTN: Monitor BP for recurrent orthostatic changes/dizziness. Allow some degree of hypertension.  10. Constipation: improved   LOS (Days) 10 A FACE TO FACE EVALUATION WAS PERFORMED  Batya Citron T 12/14/2013, 8:42 AM

## 2013-12-14 NOTE — Plan of Care (Signed)
Problem: RH Tub/Shower Transfers Goal: LTG Patient will perform tub/shower transfers w/assist (OT) LTG: Patient will perform tub/shower transfers with assist, with/without cues using equipment (OT)  Outcome: Not Applicable Date Met:  82/50/53 LTG Discharged per patient request on 12/11/13 by JLS

## 2013-12-14 NOTE — Progress Notes (Signed)
Occupational Therapy Session Note  Patient Details  Name: Alexander Cox MRN: [id number] Date of Birth: 04-17-1946  Today's Date: 12/14/2013 Time: 0900-0940 Time Calculation (min): 40 min  Short Term Goals: Week 1:  OT Short Term Goal 1 (Week 1): Pt will tolerate bathing at shower level with TSLO on for 15 min  OT Short Term Goal 2 (Week 1): Pt will bathe with min A with AE prn at shower level  OT Short Term Goal 3 (Week 1): Pt will recall 3/3 back precautions functionally with min questioning cuing OT Short Term Goal 4 (Week 1): Pt will be able to instruct caregiver how to don brace properly with min questioning cuing OT Short Term Goal 5 (Week 1): Pt will transfer to toilet/ BSC with min A   Skilled Therapeutic Interventions/Progress Updates:  Patient resting in w/c with wife at his side upon arrival.  Engaged in self care retraining to include sponge bath and dress.  Focused session on meeting OT LTGs related to bath and dress and patient/caregiver education.  Patient performs bed level bath for UB secondary to need to doff/don the TLSO in supine and sitting EOB and standing for LB bath and dressing.  Patient able to cross legs in tailor sit to don socks and shoes and uses elastic laces to improve independence.  Wife independent assisting PRN and providing vcs for back precautions PRN.  Wife is independent doffing and donning TLSO and independently states plan for shower at home using tub bench and LH sponge.  Patient and wife both state how satisfied they are with the care they have received while here on rehab.  Therapy Documentation Precautions:  Precautions Precautions: Back Precaution Comments: no BAT  Required Braces or Orthoses: Spinal Brace Spinal Brace: Thoracolumbosacral orthotic;Applied in supine position Restrictions Weight Bearing Restrictions: No Pain: No report of pain ADL: See FIM for current functional status  Therapy/Group: Individual Therapy  Almas Rake,  Channelle Bottger 12/14/2013, 1:36 PM

## 2013-12-14 NOTE — Discharge Instructions (Signed)
Inpatient Rehab Discharge Instructions  Alexander Cox Discharge date and time:  12/14/13  Activities/Precautions/ Functional Status: Activity: no lifting, driving, or strenuous exercise  till cleared by MD.  Diet: regular diet Wound Care: none needed  Functional status:  ___ No restrictions     ___ Walk up steps independently _X__ 24/7 supervision/assistance   ___ Walk up steps with assistance ___ Intermittent supervision/assistance  ___ Bathe/dress independently ___ Walk with walker     _X__ Bathe/dress with assistance ___ Walk Independently    ___ Shower independently _X__ Walk with supervision    ___ Shower with assistance _X__ No alcohol     ___ Return to work/school ________  COMMUNITY REFERRALS UPON DISCHARGE:   Home Health:   PT     OT  Agency:  [facility] Phone:  [phone]  Medical Equipment/Items Ordered:  Tub transfer bench; bedside commode  Agency/Supplier:  [facility]        Phone:  [phone]  Special Instructions: 1. Need to wear brace before getting out of bed. Wear brace as much as possible. Ok to sleep in brace.  Monitor skin for signs of breakdown.    My questions have been answered and I understand these instructions. I will adhere to these goals and the provided educational materials after my discharge from the hospital.  Patient/Caregiver Signature _______________________________ Date __________  Clinician Signature _______________________________________ Date __________  Please bring this form and your medication list with you to all your follow-up doctor's appointments.

## 2013-12-14 NOTE — Progress Notes (Signed)
Occupational Therapy Discharge Summary  Patient Details  Name: Alexander Cox MRN: [id number] Date of Birth: [birth year]  Today's Date: 12/14/2013  Patient has met 9 of 9 long term goals due to improved activity tolerance, improved balance and ability to compensate for deficits.  Patient to discharge at overall Supervision/Modified independent level.  Due to patient's fluctuations in cognitive status, goals were downgraded to supervision and wife educated as such, however as pt's medications were changed he has cleared and has exceeded supervision goals.  Patient's care partner is independent to provide the necessary cognitive assistance at discharge.    Reasons goals not met: N/A  Recommendation:  Patient will benefit from ongoing skilled OT services in home health setting to continue to advance functional skills in the area of BADL, iADL and Reduce care partner burden.  Equipment: tub transfer bench and BSC  Reasons for discharge: treatment goals met and discharge from hospital  Patient/family agrees with progress made and goals achieved: Yes  OT Discharge Precautions/Restrictions  Precautions Precautions: Back Required Braces or Orthoses: Spinal Brace Spinal Brace: Thoracolumbosacral orthotic;Applied in supine position Pain Pain Assessment Pain Assessment: 0-10 Pain Score: 0-No pain ADL ADL ADL Comments: see FIM Vision/Perception  Vision- History Baseline Vision/History: Wears glasses Wears Glasses: At all times Patient Visual Report: No change from baseline Vision- Assessment Vision Assessment?: No apparent visual deficits  Cognition Overall Cognitive Status: Within Functional Limits for tasks assessed Arousal/Alertness: Awake/alert Orientation Level: Oriented X4 Safety/Judgment: Appears intact Sensation Sensation Light Touch: Impaired Detail Light Touch Impaired Details: Impaired RLE Proprioception: Impaired Detail Proprioception Impaired Details: Impaired  RLE Additional Comments: nerve pain between R ankle/foot and increased numbness/tingling RLE with standing/activity Coordination Gross Motor Movements are Fluid and Coordinated: Yes Fine Motor Movements are Fluid and Coordinated: Yes Extremity/Trunk Assessment RUE Assessment RUE Assessment: Within Functional Limits LUE Assessment LUE Assessment: Within Functional Limits  See FIM for current functional status  [name], [facility] 12/14/2013, 2:30 PM

## 2013-12-17 ENCOUNTER — Telehealth: Payer: Self-pay | Admitting: *Deleted

## 2013-12-17 NOTE — Telephone Encounter (Signed)
Verbal order given for HHPT (again PLEASE find out why they ask for this when it's already ordered).  Tylenol ok

## 2013-12-17 NOTE — Telephone Encounter (Signed)
Notified. 

## 2013-12-17 NOTE — Telephone Encounter (Signed)
Request VO for  HHPT plan of care 1wk1,2wk3,1wk4. (I will ok that) but also needs to know if he can take tylenol. I don't see a direct reason why he can't, but I need your ok.

## 2014-01-01 ENCOUNTER — Encounter: Payer: Medicare Other | Attending: Physical Medicine & Rehabilitation | Admitting: Physical Medicine & Rehabilitation

## 2014-01-01 ENCOUNTER — Encounter: Payer: Self-pay | Admitting: Physical Medicine & Rehabilitation

## 2014-01-01 VITALS — BP 141/88 | HR 87 | Resp 14 | Ht 67.0 in | Wt 155.0 lb

## 2014-01-01 DIAGNOSIS — Z4789 Encounter for other orthopedic aftercare: Secondary | ICD-10-CM | POA: Diagnosis present

## 2014-01-01 DIAGNOSIS — M4310 Spondylolisthesis, site unspecified: Secondary | ICD-10-CM | POA: Insufficient documentation

## 2014-01-01 DIAGNOSIS — S22089S Unspecified fracture of T11-T12 vertebra, sequela: Secondary | ICD-10-CM

## 2014-01-01 DIAGNOSIS — S2242XS Multiple fractures of ribs, left side, sequela: Secondary | ICD-10-CM

## 2014-01-01 DIAGNOSIS — M4316 Spondylolisthesis, lumbar region: Secondary | ICD-10-CM

## 2014-01-01 DIAGNOSIS — M5416 Radiculopathy, lumbar region: Secondary | ICD-10-CM

## 2014-01-01 DIAGNOSIS — IMO0002 Reserved for concepts with insufficient information to code with codable children: Secondary | ICD-10-CM

## 2014-01-01 HISTORY — DX: Spondylolisthesis, lumbar region: M43.16

## 2014-01-01 HISTORY — DX: Radiculopathy, lumbar region: M54.16

## 2014-01-01 MED ORDER — GABAPENTIN 300 MG PO CAPS: 300.0000 mg | ORAL_CAPSULE | Freq: Three times a day (TID) | ORAL | Status: DC

## 2014-01-01 MED ORDER — NORTRIPTYLINE HCL 25 MG PO CAPS: 25.0000 mg | ORAL_CAPSULE | Freq: Every day | ORAL | Status: DC

## 2014-01-01 NOTE — Progress Notes (Addendum)
Subjective:    Patient ID: Alexander Cox, male    DOB: December 04, 1945, 68 y.o.   MRN: [id number]  Alexander Cox is back regarding his back trauma. He continues to have some pain in his back. After his oral steroids wore off he has had recurrence of his radicular pain. While in the hospital we were looking at doing a right L5 transforaminal block, but when he did so well with the oral steroids, we decided to hold off. His pain still down the right leg to outer foot. Pain is worse with weight bearing and most activities. He has really been limited in any PT due to his discomfort. His bowels and bladder are working without issues. His appetite has been good.   He is using tramadol for breakthrough pain. He continues on gabapentin bid an nortriptyline as well.    HPI Pain Inventory Average Pain 4 Pain Right Now 4 My pain is sharp, dull and aching  In the last 24 hours, has pain interfered with the following? General activity 7 Relation with others 7 Enjoyment of life 7 What TIME of day is your pain at its worst? evening Sleep (in general) Good  Pain is worse with: walking Pain improves with: rest Relief from Meds: 5  Mobility walk with assistance use a walker how many minutes can you walk? 1 ability to climb steps?  yes do you drive?  no use a wheelchair transfers alone  Function not employed: date last employed na retired I need assistance with the following:  dressing, bathing and meal prep  Neuro/Psych numbness tingling spasms  Prior Studies Any changes since last visit?  no  Physicians involved in your care Any changes since last visit?  yes Neurologist [facility]   Family History  Problem Relation Age of Onset  . Hypertension Paternal Grandfather    History   Social History  . Marital Status: Married    Spouse Name: N/A    Number of Children: N/A  . Years of Education: N/A   Social History Main Topics  . Smoking status: Former Research scientist (life sciences)  . Smokeless  tobacco: Current User  . Alcohol Use: No  . Drug Use: No  . Sexual Activity: None   Other Topics Concern  . None   Social History Narrative   ** Merged History Encounter **       History reviewed. No pertinent past surgical history. Past Medical History  Diagnosis Date  . Hypertension   . High cholesterol   . Celiac disease   . Nephrolithiasis     right   BP 141/88  Pulse 87  Resp 14  Ht 5\' 7"  (1.702 m)  Wt 155 lb (70.308 kg)  BMI 24.27 kg/m2  SpO2 99%  Opioid Risk Score:   Fall Risk Score: High Fall Risk (>13 points) (pt educated handout given to pt)    Review of Systems  Musculoskeletal: Positive for back pain and gait problem.  Neurological: Positive for numbness.       Spasms, tingling  All other systems reviewed and are negative.      Objective:   Physical Exam  HEENT: normal  Cardio: RRR and no murmur  Resp: CTA B/L and unlabored  GI: BS positive and NT,ND  Extremity: No Edema  Skin: Intact  Neuro: Alert/Oriented and Cranial Nerve II-XII normal  Musc/Skel: SLR + on RLE Gen: very alert and appropriate--wearing TLSO still. Motor strength is 5/5 bilateral deltoid, bicep, tricep, grip  4+/5 in the right hip  flexor knee extensor 4+/5 right ankle dorsiflexor plantar flexor  4+/5 left hip flexor 4+/5 knee extensor 5/5 ankle dorsiflexor plantar flexor     Assessment/Plan:   1. Functional deficits secondary to Multitrauma with T11 burst fracture as well as spondylolisthesis at L4-5 and more prominently at L5-S1 with subsequent right L5 radiculopathy after a fall 11/30/2013  -continue TLSO per NS  2. DVT Prophylaxis/Anticoagulation: Pharmaceutical: Lovenox  3. Pain Management:   -needs a right L5 transforaminal ESI---Dr. Letta Pate cannot do it here until late September. They have an appt for an eval by Dr. Brien Few on 8/31 which they will keep.  In the meantime: -Increase gabapentin to 300mg  TID -Increase pamelor to 25mg  qhs -continue tramadol for  breakthrough pain  Follow up with me in about 4-6 weeks. Fifteen minutes of face to face patient care time were spent during this visit. All questions were encouraged and answered.

## 2014-01-01 NOTE — Patient Instructions (Signed)
PLEASE CALL ME WITH ANY PROBLEMS OR QUESTIONS (#[phone]).      

## 2014-01-26 ENCOUNTER — Encounter: Payer: Medicare Other | Admitting: Cardiology

## 2014-01-28 ENCOUNTER — Encounter: Payer: Medicare Other | Admitting: Cardiovascular Disease

## 2014-02-04 ENCOUNTER — Other Ambulatory Visit: Payer: Self-pay | Admitting: Family Medicine

## 2014-02-04 DIAGNOSIS — R748 Abnormal levels of other serum enzymes: Secondary | ICD-10-CM

## 2014-02-10 ENCOUNTER — Ambulatory Visit: Payer: Self-pay | Admitting: Physical Medicine & Rehabilitation

## 2014-02-10 ENCOUNTER — Other Ambulatory Visit: Payer: Self-pay

## 2014-02-11 ENCOUNTER — Ambulatory Visit
Admission: RE | Admit: 2014-02-11 | Discharge: 2014-02-11 | Disposition: A | Discharge status: Home / Self Care | Payer: Medicare Other | Source: Ambulatory Visit | Attending: Family Medicine | Admitting: Family Medicine

## 2014-02-11 DIAGNOSIS — R748 Abnormal levels of other serum enzymes: Secondary | ICD-10-CM

## 2014-03-02 ENCOUNTER — Other Ambulatory Visit: Payer: Self-pay | Admitting: Gastroenterology

## 2014-03-02 DIAGNOSIS — R945 Abnormal results of liver function studies: Principal | ICD-10-CM

## 2014-03-02 DIAGNOSIS — R7989 Other specified abnormal findings of blood chemistry: Secondary | ICD-10-CM

## 2014-03-08 ENCOUNTER — Telehealth: Payer: Self-pay | Admitting: *Deleted

## 2014-03-08 NOTE — Telephone Encounter (Signed)
Pt called, concerned that his baclofen rx is running out. Pt is curious about the weaning process, would like to speak to someone on how to proceed

## 2014-03-08 NOTE — Telephone Encounter (Signed)
Take 5mg  twice daily for 3 days, once daily for 3 days and stop. If he doesn't have enough to make the entire taper, he should be fine.

## 2014-03-08 NOTE — Telephone Encounter (Signed)
Explained to take 2 5mg  BID for 3x days and the 15mg  for 3 days and he should be just fine.  If he has any problems to give [place] a call

## 2014-03-11 ENCOUNTER — Ambulatory Visit
Admission: RE | Admit: 2014-03-11 | Discharge: 2014-03-11 | Disposition: A | Discharge status: Home / Self Care | Payer: Medicare Other | Source: Ambulatory Visit | Attending: Gastroenterology | Admitting: Gastroenterology

## 2014-03-11 DIAGNOSIS — R7989 Other specified abnormal findings of blood chemistry: Secondary | ICD-10-CM

## 2014-03-11 DIAGNOSIS — R945 Abnormal results of liver function studies: Principal | ICD-10-CM

## 2014-03-11 MED ORDER — GADOBENATE DIMEGLUMINE 529 MG/ML IV SOLN
15.0000 mL | Freq: Once | INTRAVENOUS | Status: AC | PRN
  Administered 2014-03-11: 15 mL via INTRAVENOUS

## 2014-03-31 ENCOUNTER — Encounter: Payer: Medicare Other | Attending: Physical Medicine & Rehabilitation | Admitting: Physical Medicine & Rehabilitation

## 2014-03-31 ENCOUNTER — Encounter: Payer: Self-pay | Admitting: Physical Medicine & Rehabilitation

## 2014-03-31 VITALS — BP 118/67 | HR 73 | Resp 14 | Ht 67.0 in | Wt 160.0 lb

## 2014-03-31 DIAGNOSIS — M79604 Pain in right leg: Secondary | ICD-10-CM | POA: Diagnosis present

## 2014-03-31 DIAGNOSIS — I10 Essential (primary) hypertension: Secondary | ICD-10-CM | POA: Insufficient documentation

## 2014-03-31 DIAGNOSIS — F17290 Nicotine dependence, other tobacco product, uncomplicated: Secondary | ICD-10-CM | POA: Diagnosis not present

## 2014-03-31 DIAGNOSIS — M4316 Spondylolisthesis, lumbar region: Secondary | ICD-10-CM | POA: Diagnosis not present

## 2014-03-31 DIAGNOSIS — E780 Pure hypercholesterolemia: Secondary | ICD-10-CM | POA: Diagnosis not present

## 2014-03-31 DIAGNOSIS — Z87311 Personal history of (healed) other pathological fracture: Secondary | ICD-10-CM | POA: Diagnosis not present

## 2014-03-31 DIAGNOSIS — S22089S Unspecified fracture of T11-T12 vertebra, sequela: Secondary | ICD-10-CM

## 2014-03-31 DIAGNOSIS — M5416 Radiculopathy, lumbar region: Secondary | ICD-10-CM | POA: Diagnosis not present

## 2014-03-31 DIAGNOSIS — S22009S Unspecified fracture of unspecified thoracic vertebra, sequela: Secondary | ICD-10-CM

## 2014-03-31 DIAGNOSIS — Z87828 Personal history of other (healed) physical injury and trauma: Secondary | ICD-10-CM | POA: Insufficient documentation

## 2014-03-31 DIAGNOSIS — S2242XS Multiple fractures of ribs, left side, sequela: Secondary | ICD-10-CM

## 2014-03-31 MED ORDER — GABAPENTIN 600 MG PO TABS: 600.0000 mg | ORAL_TABLET | Freq: Three times a day (TID) | ORAL | Status: DC

## 2014-03-31 NOTE — Progress Notes (Signed)
Subjective:    Patient ID: Alexander Cox, male    DOB: March 03, 1946, 68 y.o.   MRN: [id number]  HPI   Mr. Knee is back regarding his poly trauma. He is gradually improving. He still has some pain in his right leg if he walks more than 100 yds or if he stands more than 41minutes or so. It has improved but has plateaued over the last couple months.   He's had three transforaminal ESI's at Dr. Marchelle Folks office. He had some relief with the last of the three injections. He stopped the tramadol along with tylenol and his statin due to increased LFT's.   Pain Inventory Average Pain 3 Pain Right Now 3 My pain is tingling and numbness  In the last 24 hours, has pain interfered with the following? General activity 2 Relation with others 2 Enjoyment of life 3 What TIME of day is your pain at its worst? morning, evening Sleep (in general) Fair  Pain is worse with: walking and standing Pain improves with: rest Relief from Meds: 8  Mobility walk without assistance how many minutes can you walk? 10 ability to climb steps?  yes do you drive?  yes  Function retired Do you have any goals in this area?  no  Neuro/Psych numbness tingling trouble walking  Prior Studies Any changes since last visit?  yes CT/MRI  Physicians involved in your care Any changes since last visit?  no   Family History  Problem Relation Age of Onset  . Hypertension Paternal Grandfather    History   Social History  . Marital Status: Married    Spouse Name: N/A    Number of Children: N/A  . Years of Education: N/A   Social History Main Topics  . Smoking status: Former Research scientist (life sciences)  . Smokeless tobacco: Current User  . Alcohol Use: No  . Drug Use: No  . Sexual Activity: None   Other Topics Concern  . None   Social History Narrative   ** Merged History Encounter **       History reviewed. No pertinent past surgical history. Past Medical History  Diagnosis Date  . Hypertension   . High  cholesterol   . Celiac disease   . Nephrolithiasis     right   BP 118/67 mmHg  Pulse 73  Resp 14  Ht 5\' 7"  (1.702 m)  Wt 160 lb (72.576 kg)  BMI 25.05 kg/m2  SpO2 98%  Opioid Risk Score:   Fall Risk Score: Low Fall Risk (0-5 points) Review of Systems  Neurological: Positive for numbness.       Tingling  All other systems reviewed and are negative.      Objective:   Physical Exam  HEENT: normal  Cardio: RRR and no murmur  Resp: CTA B/L and unlabored  GI: BS positive and NT,ND  Extremity: No Edema  Skin: Intact  Neuro: Alert/Oriented and Cranial Nerve II-XII normal  Musc/Skel: SLR + on RLE  Gen: very alert and appropriate-good lumbar ROM Motor strength is 5/5 bilateral deltoid, bicep, tricep, grip  4+/5 in the right hip flexor knee extensor 4+/5 right ankle dorsiflexor plantar flexor  4+/5 left hip flexor 4+/5 knee extensor 5/5  Left ankle dorsiflexor plantar flexor Right ADF/toe dorsiflexion 4/5. RHE 4/5. Decreased sensation primarily over the L5 distribution   Assessment/Plan:  1. Functional deficits secondary to Multitrauma with T11 burst fracture as well as spondylolisthesis at L4-5 and more prominently at L5-S1 with subsequent right L5 radiculopathy after  a fall 11/30/2013  -discussed aggressive ROM and core strengthening  -NS follow up as planned---pt hopes to avoid surgery 2. Pain Management:  -stop pamelor -Increase gabapentin to 600mg  TID   -may ontinue tramadol for breakthrough pain--he is off tylenol and lipid aganet currently---should have now problems with LFT's if he uses this sparingly   Return in about 2 months (around 05/31/2014).

## 2014-03-31 NOTE — Patient Instructions (Addendum)
GABAPENTIN: 300-300-600 WEEK 1 600-300-600 WEEK 2 600-600-600 WEEK 3 AND ONWARD   WORK ON REGULAR STRETCHING AND RANGE OF MOTION

## 2014-05-31 ENCOUNTER — Encounter: Payer: Self-pay | Admitting: Physical Medicine & Rehabilitation

## 2014-05-31 ENCOUNTER — Encounter: Payer: Medicare Other | Attending: Physical Medicine & Rehabilitation | Admitting: Physical Medicine & Rehabilitation

## 2014-05-31 VITALS — BP 109/67 | HR 70 | Resp 14

## 2014-05-31 DIAGNOSIS — Z87828 Personal history of other (healed) physical injury and trauma: Secondary | ICD-10-CM | POA: Diagnosis not present

## 2014-05-31 DIAGNOSIS — S22009S Unspecified fracture of unspecified thoracic vertebra, sequela: Secondary | ICD-10-CM

## 2014-05-31 DIAGNOSIS — Z87311 Personal history of (healed) other pathological fracture: Secondary | ICD-10-CM | POA: Diagnosis not present

## 2014-05-31 DIAGNOSIS — M5416 Radiculopathy, lumbar region: Secondary | ICD-10-CM | POA: Diagnosis not present

## 2014-05-31 DIAGNOSIS — M79604 Pain in right leg: Secondary | ICD-10-CM | POA: Diagnosis present

## 2014-05-31 DIAGNOSIS — E780 Pure hypercholesterolemia: Secondary | ICD-10-CM | POA: Diagnosis not present

## 2014-05-31 DIAGNOSIS — M4316 Spondylolisthesis, lumbar region: Secondary | ICD-10-CM | POA: Diagnosis not present

## 2014-05-31 DIAGNOSIS — F17290 Nicotine dependence, other tobacco product, uncomplicated: Secondary | ICD-10-CM | POA: Diagnosis not present

## 2014-05-31 DIAGNOSIS — I10 Essential (primary) hypertension: Secondary | ICD-10-CM | POA: Insufficient documentation

## 2014-05-31 DIAGNOSIS — S2242XS Multiple fractures of ribs, left side, sequela: Secondary | ICD-10-CM

## 2014-05-31 DIAGNOSIS — S22089S Unspecified fracture of T11-T12 vertebra, sequela: Secondary | ICD-10-CM

## 2014-05-31 MED ORDER — GABAPENTIN 600 MG PO TABS: 600.0000 mg | ORAL_TABLET | Freq: Four times a day (QID) | ORAL | Status: DC

## 2014-05-31 NOTE — Patient Instructions (Signed)
PLEASE CALL ME WITH ANY PROBLEMS OR QUESTIONS (#[phone]).      

## 2014-05-31 NOTE — Progress Notes (Signed)
Subjective:    Patient ID: Alexander Cox, male    DOB: Aug 28, 1945, 69 y.o.   MRN: [id number]  HPI   Alexander Cox is back regarding his polytrauma. He is having some numbness and pain in the right leg. We increased his gabapentin at last visit to help with some of the radicular pain which has helped. His symptoms are worst in the evening, especially when he's been active on a given day.  He is staying active doing pushups, eliptical, walking, stretching, etc.   No surgery is planned at this point by Dr. Annette Stable    Pain Inventory Average Pain 5 Pain Right Now 4 My pain is sharp and burning  In the last 24 hours, has pain interfered with the following? General activity 3 Relation with others 2 Enjoyment of life 4 What TIME of day is your pain at its worst? evening Sleep (in general) Good  Pain is worse with: walking and standing Pain improves with: rest Relief from Meds: na  Mobility walk without assistance how many minutes can you walk? 15 ability to climb steps?  yes do you drive?  yes  Function retired  Neuro/Psych numbness  Prior Studies Any changes since last visit?  no  Physicians involved in your care Any changes since last visit?  no   Family History  Problem Relation Age of Onset  . Hypertension Paternal Grandfather    History   Social History  . Marital Status: Married    Spouse Name: N/A    Number of Children: N/A  . Years of Education: N/A   Social History Main Topics  . Smoking status: Former Research scientist (life sciences)  . Smokeless tobacco: Current User  . Alcohol Use: No  . Drug Use: No  . Sexual Activity: None   Other Topics Concern  . None   Social History Narrative   ** Merged History Encounter **       History reviewed. No pertinent past surgical history. Past Medical History  Diagnosis Date  . Hypertension   . High cholesterol   . Celiac disease   . Nephrolithiasis     right   BP 109/67 P 70 R 14 o2 SAT 100% Opioid Risk Score:   Fall  Risk Score: Low Fall Risk (0-5 points) Review of Systems  Neurological: Positive for numbness.  All other systems reviewed and are negative.      Objective:   Physical Exam  HEENT: normal  Cardio: RRR and no murmur  Resp: CTA B/L and unlabored  GI: BS positive and NT,ND  Extremity: No Edema  Skin: Intact  Neuro: Alert/Oriented and Cranial Nerve II-XII normal  Musc/Skel: SLR + on RLE. Normal back rom. Able to easily bend and touch toes. Gen: very alert and appropriate-good lumbar ROM   Motor strength is 5/5 bilateral deltoid, bicep, tricep, grip  4+/5 in the right hip flexor knee extensor 5-/5 right ankle dorsiflexor plantar flexor  4+/5 left hip flexor 5/5 knee extensor 5/5 Left ankle dorsiflexor plantar flexor  Right ADF/toe dorsiflexion 5-/5. RHE 5-/5. Decreased sensation primarily over the L5 distribution     Assessment/Plan:  1. Functional deficits secondary to Multitrauma with T11 burst fracture as well as spondylolisthesis at L4-5 and more prominently at L5-S1 with subsequent right L5 radiculopathy after a fall 11/30/2013  -discussed aggressive ROM and core strengthening  -NS follow up as planned--no surgery is planned at this point.    2. Pain Management:    -  gabapentin 600mg  TID ---increase  to 600mg  bid and 900-1200mg  at hs. -may try some otc topical creams for relief to his right leg  Return in about 4 months (around 05/31/2014).

## 2014-07-26 ENCOUNTER — Other Ambulatory Visit: Payer: Self-pay | Admitting: Neurosurgery

## 2014-07-26 DIAGNOSIS — G8929 Other chronic pain: Secondary | ICD-10-CM

## 2014-07-26 DIAGNOSIS — M545 Low back pain: Principal | ICD-10-CM

## 2014-08-04 ENCOUNTER — Other Ambulatory Visit: Payer: Medicare Other

## 2014-08-09 ENCOUNTER — Ambulatory Visit
Admission: RE | Admit: 2014-08-09 | Discharge: 2014-08-09 | Disposition: A | Discharge status: Home / Self Care | Payer: Medicare Other | Source: Ambulatory Visit | Attending: Neurosurgery | Admitting: Neurosurgery

## 2014-08-09 DIAGNOSIS — M545 Low back pain, unspecified: Secondary | ICD-10-CM

## 2014-08-09 DIAGNOSIS — G8929 Other chronic pain: Secondary | ICD-10-CM

## 2014-08-09 DIAGNOSIS — M5416 Radiculopathy, lumbar region: Secondary | ICD-10-CM

## 2014-08-09 DIAGNOSIS — M4316 Spondylolisthesis, lumbar region: Secondary | ICD-10-CM

## 2014-08-09 MED ORDER — IOHEXOL 180 MG/ML  SOLN
15.0000 mL | Freq: Once | INTRAMUSCULAR | Status: AC | PRN
  Administered 2014-08-09: 15 mL via INTRATHECAL

## 2014-08-09 MED ORDER — ONDANSETRON HCL 4 MG/2ML IJ SOLN: 4.0000 mg | Freq: Four times a day (QID) | INTRAMUSCULAR | Status: DC | PRN

## 2014-08-09 MED ORDER — DIAZEPAM 5 MG PO TABS
10.0000 mg | ORAL_TABLET | Freq: Once | ORAL | Status: AC
  Administered 2014-08-09: 10 mg via ORAL

## 2014-08-09 NOTE — Progress Notes (Signed)
Patient states he no longer takes Nortriptyline and Tramadol.  Understands he may resume these medications tomorrow if he wishes.  jkl

## 2014-08-09 NOTE — Discharge Instructions (Signed)
[facility] Discharge Instructions  1. Go home and rest quietly for the next 24 hours.  It is important to lie flat for the next 24 hours.  Get up only to go to the restroom.  You may lie in the bed or on a couch on your back, your stomach, your left side or your right side.  You may have one pillow under your head.  You may have pillows between your knees while you are on your side or under your knees while you are on your back.  2. DO NOT drive today.  Recline the seat as far back as it will go, while still wearing your seat belt, on the way home.  3. You may get up to go to the bathroom as needed.  You may sit up for 10 minutes to eat.  You may resume your normal diet and medications unless otherwise indicated.  Drink plenty of extra fluids today and tomorrow.  4. The incidence of a spinal headache with nausea and/or vomiting is about 5% (one in 20 patients).  If you develop a headache, lie flat and drink plenty of fluids until the headache goes away.  Caffeinated beverages may be helpful.  If you develop severe nausea and vomiting or a headache that does not go away with flat bed rest, call [phone].  5. You may resume normal activities after your 24 hours of bed rest is over; however, do not exert yourself strongly or do any heavy lifting tomorrow.  6. Call your physician for a follow-up appointment.   You may resume Nortriptyline and Tramadol on Tuesday, August 10, 2014 after 7:30a.m.

## 2014-09-01 ENCOUNTER — Other Ambulatory Visit: Payer: Self-pay | Admitting: Neurosurgery

## 2014-09-07 ENCOUNTER — Encounter (HOSPITAL_COMMUNITY): Payer: Self-pay

## 2014-09-07 ENCOUNTER — Encounter (HOSPITAL_COMMUNITY)
Admission: RE | Admit: 2014-09-07 | Discharge: 2014-09-07 | Disposition: A | Discharge status: Home / Self Care | Payer: Medicare Other | Source: Ambulatory Visit | Attending: Neurosurgery | Admitting: Neurosurgery

## 2014-09-07 HISTORY — DX: Unspecified asthma, uncomplicated: J45.909

## 2014-09-07 HISTORY — DX: Personal history of urinary calculi: Z87.442

## 2014-09-07 LAB — BASIC METABOLIC PANEL
Anion gap: 9 (ref 5–15)
BUN: 18 mg/dL (ref 6–23)
CO2: 27 mmol/L (ref 19–32)
Calcium: 9.4 mg/dL (ref 8.4–10.5)
Chloride: 102 mmol/L (ref 96–112)
Creatinine, Ser: 1.3 mg/dL (ref 0.50–1.35)
GFR, EST AFRICAN AMERICAN: 64 mL/min — AB (ref >90)
GFR, EST NON AFRICAN AMERICAN: 55 mL/min — AB (ref >90)
GLUCOSE: 96 mg/dL (ref 70–99)
Potassium: 3.9 mmol/L (ref 3.5–5.1)
Sodium: 138 mmol/L (ref 135–145)

## 2014-09-07 LAB — CBC
HCT: 44.3 % (ref 39.0–52.0)
Hemoglobin: 14.8 g/dL (ref 13.0–17.0)
MCH: 29.8 pg (ref 26.0–34.0)
MCHC: 33.4 g/dL (ref 30.0–36.0)
MCV: 89.3 fL (ref 78.0–100.0)
Platelets: 250 10*3/uL (ref 150–400)
RBC: 4.96 MIL/uL (ref 4.22–5.81)
RDW: 12.6 % (ref 11.5–15.5)
WBC: 6.6 10*3/uL (ref 4.0–10.5)

## 2014-09-07 LAB — TYPE AND SCREEN
ABO/RH(D): A POS
Antibody Screen: NEGATIVE

## 2014-09-07 LAB — ABO/RH: ABO/RH(D): A POS

## 2014-09-07 LAB — SURGICAL PCR SCREEN
MRSA, PCR: NEGATIVE
Staphylococcus aureus: NEGATIVE

## 2014-09-07 NOTE — Pre-Procedure Instructions (Addendum)
[place]  09/07/2014   Your procedure is scheduled on:  09/10/14  Report to [facility] short stay admitting at 530 AM.  Call this number if you have problems the morning of surgery: [phone]   Remember:   Do not eat food or drink liquids after midnight.   Take these medicines the morning of surgery with A SIP OF WATER: gabapentin     STOP all herbel meds, nsaids (aleve,naproxen,advil,ibuprofen) now including vitamins,calcium,fish oil, aspirin   Do not wear jewelry, make-up or nail polish.  Do not wear lotions, powders, or perfumes. You may wear deodorant.  Do not shave 48 hours prior to surgery. Men may shave face and neck.  Do not bring valuables to the hospital.  [facility] is not responsible                  for any belongings or valuables.               Contacts, dentures or bridgework may not be worn into surgery.  Leave suitcase in the car. After surgery it may be brought to your room.  For patients admitted to the hospital, discharge time is determined by your                treatment team.               Patients discharged the day of surgery will not be allowed to drive  home.  Name and phone number of your driver:   Special Instructions:  Special Instructions: [place] - Preparing for Surgery  Before surgery, you can play an important role.  Because skin is not sterile, your skin needs to be as free of germs as possible.  You can reduce the number of germs on you skin by washing with CHG (chlorahexidine gluconate) soap before surgery.  CHG is an antiseptic cleaner which kills germs and bonds with the skin to continue killing germs even after washing.  Please DO NOT use if you have an allergy to CHG or antibacterial soaps.  If your skin becomes reddened/irritated stop using the CHG and inform your nurse when you arrive at Short Stay.  Do not shave (including legs and underarms) for at least 48 hours prior to the first CHG shower.  You may shave your  face.  Please follow these instructions carefully:   1.  Shower with CHG Soap the night before surgery and the morning of Surgery.  2.  If you choose to wash your hair, wash your hair first as usual with your normal shampoo.  3.  After you shampoo, rinse your hair and body thoroughly to remove the Shampoo.  4.  Use CHG as you would any other liquid soap.  You can apply chg directly  to the skin and wash gently with scrungie or a clean washcloth.  5.  Apply the CHG Soap to your body ONLY FROM THE NECK DOWN.  Do not use on open wounds or open sores.  Avoid contact with your eyes ears, mouth and genitals (private parts).  Wash genitals (private parts)       with your normal soap.  6.  Wash thoroughly, paying special attention to the area where your surgery will be performed.  7.  Thoroughly rinse your body with warm water from the neck down.  8.  DO NOT shower/wash with your normal soap after using and rinsing off the CHG Soap.  9.  Pat yourself dry with a  clean towel.            10.  Wear clean pajamas.            11.  Place clean sheets on your bed the night of your first shower and do not sleep with pets.  Day of Surgery  Do not apply any lotions/deodorants the morning of surgery.  Please wear clean clothes to the hospital/surgery center.   Please read over the following fact sheets that you were given: Pain Booklet, Coughing and Deep Breathing, Blood Transfusion Information, MRSA Information and Surgical Site Infection Prevention

## 2014-09-10 ENCOUNTER — Inpatient Hospital Stay (HOSPITAL_COMMUNITY)
Admission: RE | Admit: 2014-09-10 | Discharge: 2014-09-11 | DRG: 460 | Disposition: A | Discharge status: Home Health | Payer: Medicare Other | Source: Ambulatory Visit | Attending: Neurosurgery | Admitting: Neurosurgery

## 2014-09-10 ENCOUNTER — Encounter (HOSPITAL_COMMUNITY): Payer: Self-pay | Admitting: *Deleted

## 2014-09-10 ENCOUNTER — Inpatient Hospital Stay (HOSPITAL_COMMUNITY): Payer: Medicare Other

## 2014-09-10 ENCOUNTER — Encounter (HOSPITAL_COMMUNITY)
Admission: RE | Disposition: A | Discharge status: Home / Self Care | Payer: Medicare Other | Source: Ambulatory Visit | Attending: Neurosurgery

## 2014-09-10 ENCOUNTER — Inpatient Hospital Stay (HOSPITAL_COMMUNITY): Payer: Medicare Other | Admitting: Anesthesiology

## 2014-09-10 DIAGNOSIS — E780 Pure hypercholesterolemia: Secondary | ICD-10-CM | POA: Diagnosis present

## 2014-09-10 DIAGNOSIS — Z87442 Personal history of urinary calculi: Secondary | ICD-10-CM | POA: Diagnosis not present

## 2014-09-10 DIAGNOSIS — J45909 Unspecified asthma, uncomplicated: Secondary | ICD-10-CM | POA: Diagnosis present

## 2014-09-10 DIAGNOSIS — I10 Essential (primary) hypertension: Secondary | ICD-10-CM | POA: Diagnosis present

## 2014-09-10 DIAGNOSIS — M5416 Radiculopathy, lumbar region: Secondary | ICD-10-CM | POA: Diagnosis present

## 2014-09-10 DIAGNOSIS — M4317 Spondylolisthesis, lumbosacral region: Secondary | ICD-10-CM

## 2014-09-10 DIAGNOSIS — Z87891 Personal history of nicotine dependence: Secondary | ICD-10-CM | POA: Diagnosis not present

## 2014-09-10 DIAGNOSIS — K900 Celiac disease: Secondary | ICD-10-CM | POA: Diagnosis present

## 2014-09-10 DIAGNOSIS — Z7982 Long term (current) use of aspirin: Secondary | ICD-10-CM

## 2014-09-10 DIAGNOSIS — Z419 Encounter for procedure for purposes other than remedying health state, unspecified: Secondary | ICD-10-CM

## 2014-09-10 HISTORY — DX: Spondylolisthesis, lumbosacral region: M43.17

## 2014-09-10 SURGERY — POSTERIOR LUMBAR FUSION 1 LEVEL
Anesthesia: General | Site: Back

## 2014-09-10 MED ORDER — DIAZEPAM 5 MG PO TABS
5.0000 mg | ORAL_TABLET | Freq: Four times a day (QID) | ORAL | Status: DC | PRN
  Administered 2014-09-10 – 2014-09-11 (×2): 5 mg via ORAL
  Filled 2014-09-10: qty 1

## 2014-09-10 MED ORDER — HYDROCODONE-ACETAMINOPHEN 7.5-325 MG PO TABS: 1.0000 | ORAL_TABLET | Freq: Once | ORAL | Status: DC | PRN

## 2014-09-10 MED ORDER — HYDROMORPHONE HCL 1 MG/ML IJ SOLN
INTRAMUSCULAR | Status: AC
  Filled 2014-09-10: qty 1

## 2014-09-10 MED ORDER — SIMVASTATIN 20 MG PO TABS
20.0000 mg | ORAL_TABLET | Freq: Every day | ORAL | Status: DC
  Administered 2014-09-10: 20 mg via ORAL
  Filled 2014-09-10 (×2): qty 1

## 2014-09-10 MED ORDER — GABAPENTIN 600 MG PO TABS
600.0000 mg | ORAL_TABLET | Freq: Four times a day (QID) | ORAL | Status: DC
  Administered 2014-09-10 (×3): 600 mg via ORAL
  Filled 2014-09-10 (×7): qty 1

## 2014-09-10 MED ORDER — PROPOFOL 10 MG/ML IV BOLUS
INTRAVENOUS | Status: DC | PRN
  Administered 2014-09-10: 150 mg via INTRAVENOUS

## 2014-09-10 MED ORDER — HEMOSTATIC AGENTS (NO CHARGE) OPTIME
TOPICAL | Status: DC | PRN
  Administered 2014-09-10: 1 via TOPICAL

## 2014-09-10 MED ORDER — KETOROLAC TROMETHAMINE 30 MG/ML IJ SOLN
INTRAMUSCULAR | Status: AC
  Filled 2014-09-10: qty 1

## 2014-09-10 MED ORDER — ONDANSETRON HCL 4 MG/2ML IJ SOLN
INTRAMUSCULAR | Status: DC | PRN
  Administered 2014-09-10: 4 mg via INTRAVENOUS

## 2014-09-10 MED ORDER — GLYCOPYRROLATE 0.2 MG/ML IJ SOLN
INTRAMUSCULAR | Status: DC | PRN
Start: 2014-09-10 — End: 2014-09-10
  Administered 2014-09-10: 0.4 mg via INTRAVENOUS
  Administered 2014-09-10: 0.2 mg via INTRAVENOUS

## 2014-09-10 MED ORDER — DIAZEPAM 5 MG PO TABS
ORAL_TABLET | ORAL | Status: AC
  Filled 2014-09-10: qty 1

## 2014-09-10 MED ORDER — CALCIUM CARBONATE 1250 (500 CA) MG PO TABS
1.0000 | ORAL_TABLET | Freq: Every day | ORAL | Status: DC
  Administered 2014-09-10: 500 mg via ORAL
  Filled 2014-09-10 (×2): qty 1

## 2014-09-10 MED ORDER — DEXAMETHASONE SODIUM PHOSPHATE 4 MG/ML IJ SOLN
INTRAMUSCULAR | Status: AC
  Filled 2014-09-10: qty 1

## 2014-09-10 MED ORDER — HYDROMORPHONE HCL 1 MG/ML IJ SOLN
0.2500 mg | INTRAMUSCULAR | Status: DC | PRN
  Administered 2014-09-10 (×4): 0.5 mg via INTRAVENOUS

## 2014-09-10 MED ORDER — MIDAZOLAM HCL 5 MG/5ML IJ SOLN
INTRAMUSCULAR | Status: DC | PRN
  Administered 2014-09-10: 2 mg via INTRAVENOUS

## 2014-09-10 MED ORDER — PHENOL 1.4 % MT LIQD: 1.0000 | OROMUCOSAL | Status: DC | PRN

## 2014-09-10 MED ORDER — LIDOCAINE HCL (CARDIAC) 20 MG/ML IV SOLN
INTRAVENOUS | Status: AC
Start: 2014-09-10 — End: 2014-09-10
  Filled 2014-09-10: qty 5

## 2014-09-10 MED ORDER — NEOSTIGMINE METHYLSULFATE 10 MG/10ML IV SOLN
INTRAVENOUS | Status: DC | PRN
  Administered 2014-09-10: 3 mg via INTRAVENOUS

## 2014-09-10 MED ORDER — HYDROCHLOROTHIAZIDE 25 MG PO TABS
25.0000 mg | ORAL_TABLET | Freq: Every day | ORAL | Status: DC
  Administered 2014-09-10: 25 mg via ORAL
  Filled 2014-09-10 (×2): qty 1

## 2014-09-10 MED ORDER — CALCIUM CARBONATE 600 MG PO TABS: 600.0000 mg | ORAL_TABLET | Freq: Every day | ORAL | Status: DC

## 2014-09-10 MED ORDER — VANCOMYCIN HCL 1000 MG IV SOLR
INTRAVENOUS | Status: DC | PRN
  Administered 2014-09-10: 1000 mg via TOPICAL

## 2014-09-10 MED ORDER — MIDAZOLAM HCL 2 MG/2ML IJ SOLN
INTRAMUSCULAR | Status: AC
  Filled 2014-09-10: qty 2

## 2014-09-10 MED ORDER — SODIUM CHLORIDE 0.9 % IR SOLN
Status: DC | PRN
  Administered 2014-09-10: 500 mL

## 2014-09-10 MED ORDER — ACETAMINOPHEN 650 MG RE SUPP: 650.0000 mg | RECTAL | Status: DC | PRN

## 2014-09-10 MED ORDER — HYDROMORPHONE HCL 1 MG/ML IJ SOLN
0.5000 mg | INTRAMUSCULAR | Status: DC | PRN
Start: 2014-09-10 — End: 2014-09-11

## 2014-09-10 MED ORDER — ONDANSETRON HCL 4 MG/2ML IJ SOLN
INTRAMUSCULAR | Status: AC
  Filled 2014-09-10: qty 2

## 2014-09-10 MED ORDER — CEFAZOLIN SODIUM-DEXTROSE 2-3 GM-% IV SOLR
2.0000 g | INTRAVENOUS | Status: AC
  Administered 2014-09-10: 2 g via INTRAVENOUS
  Filled 2014-09-10: qty 50

## 2014-09-10 MED ORDER — ASPIRIN EC 81 MG PO TBEC
81.0000 mg | DELAYED_RELEASE_TABLET | Freq: Every day | ORAL | Status: DC
  Administered 2014-09-10: 81 mg via ORAL
  Filled 2014-09-10 (×2): qty 1

## 2014-09-10 MED ORDER — 0.9 % SODIUM CHLORIDE (POUR BTL) OPTIME
TOPICAL | Status: DC | PRN
  Administered 2014-09-10: 1000 mL

## 2014-09-10 MED ORDER — OMEGA-3-ACID ETHYL ESTERS 1 G PO CAPS
1.0000 g | ORAL_CAPSULE | Freq: Every day | ORAL | Status: DC
  Administered 2014-09-10: 1 g via ORAL
  Filled 2014-09-10 (×2): qty 1

## 2014-09-10 MED ORDER — LISINOPRIL 5 MG PO TABS
5.0000 mg | ORAL_TABLET | Freq: Every day | ORAL | Status: DC
  Administered 2014-09-10: 5 mg via ORAL
  Filled 2014-09-10 (×2): qty 1

## 2014-09-10 MED ORDER — FENTANYL CITRATE (PF) 100 MCG/2ML IJ SOLN
INTRAMUSCULAR | Status: DC | PRN
  Administered 2014-09-10: 50 ug via INTRAVENOUS
  Administered 2014-09-10: 100 ug via INTRAVENOUS

## 2014-09-10 MED ORDER — MENTHOL 3 MG MT LOZG: 1.0000 | LOZENGE | OROMUCOSAL | Status: DC | PRN

## 2014-09-10 MED ORDER — PHENYLEPHRINE HCL 10 MG/ML IJ SOLN
INTRAMUSCULAR | Status: DC | PRN
  Administered 2014-09-10 (×8): 80 ug via INTRAVENOUS

## 2014-09-10 MED ORDER — LACTATED RINGERS IV SOLN
INTRAVENOUS | Status: DC | PRN
  Administered 2014-09-10 (×2): via INTRAVENOUS

## 2014-09-10 MED ORDER — OXYCODONE-ACETAMINOPHEN 5-325 MG PO TABS
1.0000 | ORAL_TABLET | ORAL | Status: DC | PRN
  Administered 2014-09-10 – 2014-09-11 (×3): 2 via ORAL
  Filled 2014-09-10 (×3): qty 2

## 2014-09-10 MED ORDER — PROPOFOL 10 MG/ML IV BOLUS
INTRAVENOUS | Status: AC
  Filled 2014-09-10: qty 20

## 2014-09-10 MED ORDER — ROCURONIUM BROMIDE 100 MG/10ML IV SOLN
INTRAVENOUS | Status: DC | PRN
  Administered 2014-09-10: 50 mg via INTRAVENOUS

## 2014-09-10 MED ORDER — PANTOPRAZOLE SODIUM 40 MG PO TBEC: 40.0000 mg | DELAYED_RELEASE_TABLET | Freq: Every day | ORAL | Status: DC

## 2014-09-10 MED ORDER — ALUM & MAG HYDROXIDE-SIMETH 200-200-20 MG/5ML PO SUSP
30.0000 mL | Freq: Four times a day (QID) | ORAL | Status: DC | PRN
  Administered 2014-09-10: 30 mL via ORAL
  Filled 2014-09-10: qty 30

## 2014-09-10 MED ORDER — LIDOCAINE HCL (CARDIAC) 20 MG/ML IV SOLN
INTRAVENOUS | Status: DC | PRN
  Administered 2014-09-10: 80 mg via INTRAVENOUS

## 2014-09-10 MED ORDER — THROMBIN 5000 UNITS EX SOLR
CUTANEOUS | Status: DC | PRN
  Administered 2014-09-10 (×4): 5000 [IU] via TOPICAL

## 2014-09-10 MED ORDER — ADULT MULTIVITAMIN W/MINERALS CH
1.0000 | ORAL_TABLET | Freq: Every day | ORAL | Status: DC
  Administered 2014-09-10: 1 via ORAL
  Filled 2014-09-10 (×2): qty 1

## 2014-09-10 MED ORDER — VANCOMYCIN HCL 1000 MG IV SOLR
INTRAVENOUS | Status: AC
  Filled 2014-09-10: qty 1000

## 2014-09-10 MED ORDER — HYPROMELLOSE (GONIOSCOPIC) 2.5 % OP SOLN: 1.0000 [drp] | OPHTHALMIC | Status: DC | PRN

## 2014-09-10 MED ORDER — KETOROLAC TROMETHAMINE 30 MG/ML IJ SOLN
INTRAMUSCULAR | Status: DC | PRN
  Administered 2014-09-10: 30 mg via INTRAVENOUS

## 2014-09-10 MED ORDER — FENTANYL CITRATE (PF) 250 MCG/5ML IJ SOLN
INTRAMUSCULAR | Status: AC
  Filled 2014-09-10: qty 5

## 2014-09-10 MED ORDER — ONDANSETRON HCL 4 MG/2ML IJ SOLN: 4.0000 mg | INTRAMUSCULAR | Status: DC | PRN

## 2014-09-10 MED ORDER — CEFAZOLIN SODIUM 1-5 GM-% IV SOLN
1.0000 g | Freq: Three times a day (TID) | INTRAVENOUS | Status: AC
Start: 2014-09-10 — End: 2014-09-10
  Administered 2014-09-10 (×2): 1 g via INTRAVENOUS
  Filled 2014-09-10 (×2): qty 50

## 2014-09-10 MED ORDER — ROCURONIUM BROMIDE 50 MG/5ML IV SOLN
INTRAVENOUS | Status: AC
  Filled 2014-09-10: qty 1

## 2014-09-10 MED ORDER — SODIUM CHLORIDE 0.9 % IJ SOLN
3.0000 mL | Freq: Two times a day (BID) | INTRAMUSCULAR | Status: DC
Start: 2014-09-10 — End: 2014-09-11
  Administered 2014-09-10 (×2): 3 mL via INTRAVENOUS

## 2014-09-10 MED ORDER — ACETAMINOPHEN 325 MG PO TABS: 650.0000 mg | ORAL_TABLET | ORAL | Status: DC | PRN

## 2014-09-10 MED ORDER — ONDANSETRON HCL 4 MG/2ML IJ SOLN: 4.0000 mg | Freq: Once | INTRAMUSCULAR | Status: DC | PRN

## 2014-09-10 MED ORDER — EPHEDRINE SULFATE 50 MG/ML IJ SOLN
INTRAMUSCULAR | Status: DC | PRN
  Administered 2014-09-10 (×2): 10 mg via INTRAVENOUS
  Administered 2014-09-10: 5 mg via INTRAVENOUS

## 2014-09-10 MED ORDER — SODIUM CHLORIDE 0.9 % IJ SOLN: 3.0000 mL | INTRAMUSCULAR | Status: DC | PRN

## 2014-09-10 MED ORDER — DEXAMETHASONE SODIUM PHOSPHATE 4 MG/ML IJ SOLN
INTRAMUSCULAR | Status: DC | PRN
  Administered 2014-09-10: 4 mg via INTRAVENOUS

## 2014-09-10 MED ORDER — HYDROCODONE-ACETAMINOPHEN 5-325 MG PO TABS: 1.0000 | ORAL_TABLET | ORAL | Status: DC | PRN

## 2014-09-10 MED ORDER — BUPIVACAINE HCL (PF) 0.25 % IJ SOLN
INTRAMUSCULAR | Status: DC | PRN
  Administered 2014-09-10: 20 mL

## 2014-09-10 SURGICAL SUPPLY — 66 items
APL SKNCLS STERI-STRIP NONHPOA (GAUZE/BANDAGES/DRESSINGS) ×1
BAG DECANTER FOR FLEXI CONT (MISCELLANEOUS) ×3 IMPLANT
BENZOIN TINCTURE PRP APPL 2/3 (GAUZE/BANDAGES/DRESSINGS) ×3 IMPLANT
BIT DRILL NEURO 2X3.1 SFT TUCH (MISCELLANEOUS) IMPLANT
BLADE CLIPPER SURG (BLADE) IMPLANT
BRUSH SCRUB EZ PLAIN DRY (MISCELLANEOUS) ×3 IMPLANT
BUR MATCHSTICK NEURO 3.0 LAGG (BURR) ×3 IMPLANT
CAGE CAPSTONE 10X22X6 (Cage) ×4 IMPLANT
CANISTER SUCT 3000ML PPV (MISCELLANEOUS) ×3 IMPLANT
CAP LCK SPNE (Orthopedic Implant) ×4 IMPLANT
CAP LOCK SPINE RADIUS (Orthopedic Implant) IMPLANT
CAP LOCKING (Orthopedic Implant) ×12 IMPLANT
CLOSURE WOUND 1/2 X4 (GAUZE/BANDAGES/DRESSINGS) ×1
CONT SPEC 4OZ CLIKSEAL STRL BL (MISCELLANEOUS) ×6 IMPLANT
COVER BACK TABLE 60X90IN (DRAPES) ×3 IMPLANT
DECANTER SPIKE VIAL GLASS SM (MISCELLANEOUS) ×1 IMPLANT
DRAPE C-ARM 42X72 X-RAY (DRAPES) ×6 IMPLANT
DRAPE LAPAROTOMY 100X72X124 (DRAPES) ×3 IMPLANT
DRAPE POUCH INSTRU U-SHP 10X18 (DRAPES) ×3 IMPLANT
DRAPE PROXIMA HALF (DRAPES) IMPLANT
DRAPE SURG 17X23 STRL (DRAPES) ×12 IMPLANT
DRILL NEURO 2X3.1 SOFT TOUCH (MISCELLANEOUS) ×3
DRSG OPSITE POSTOP 4X6 (GAUZE/BANDAGES/DRESSINGS) ×2 IMPLANT
DURAPREP 26ML APPLICATOR (WOUND CARE) ×3 IMPLANT
ELECT REM PT RETURN 9FT ADLT (ELECTROSURGICAL) ×3
ELECTRODE REM PT RTRN 9FT ADLT (ELECTROSURGICAL) ×1 IMPLANT
EVACUATOR 1/8 PVC DRAIN (DRAIN) ×3 IMPLANT
GAUZE SPONGE 4X4 12PLY STRL (GAUZE/BANDAGES/DRESSINGS) ×3 IMPLANT
GAUZE SPONGE 4X4 16PLY XRAY LF (GAUZE/BANDAGES/DRESSINGS) ×2 IMPLANT
GLOVE BIOGEL PI IND STRL 7.5 (GLOVE) IMPLANT
GLOVE BIOGEL PI INDICATOR 7.5 (GLOVE) ×6
GLOVE ECLIPSE 6.5 STRL STRAW (GLOVE) ×2 IMPLANT
GLOVE ECLIPSE 7.5 STRL STRAW (GLOVE) ×12 IMPLANT
GLOVE ECLIPSE 9.0 STRL (GLOVE) ×8 IMPLANT
GLOVE EXAM NITRILE LRG STRL (GLOVE) IMPLANT
GLOVE EXAM NITRILE MD LF STRL (GLOVE) IMPLANT
GLOVE EXAM NITRILE XL STR (GLOVE) ×4 IMPLANT
GLOVE EXAM NITRILE XS STR PU (GLOVE) IMPLANT
GOWN STRL REUS W/ TWL LRG LVL3 (GOWN DISPOSABLE) IMPLANT
GOWN STRL REUS W/ TWL XL LVL3 (GOWN DISPOSABLE) ×2 IMPLANT
GOWN STRL REUS W/TWL 2XL LVL3 (GOWN DISPOSABLE) IMPLANT
GOWN STRL REUS W/TWL LRG LVL3 (GOWN DISPOSABLE) ×3
GOWN STRL REUS W/TWL XL LVL3 (GOWN DISPOSABLE) ×12
KIT BASIN OR (CUSTOM PROCEDURE TRAY) ×3 IMPLANT
KIT ROOM TURNOVER OR (KITS) ×3 IMPLANT
LIQUID BAND (GAUZE/BANDAGES/DRESSINGS) ×3 IMPLANT
MILL MEDIUM DISP (BLADE) ×2 IMPLANT
NEEDLE HYPO 22GX1.5 SAFETY (NEEDLE) ×3 IMPLANT
NS IRRIG 1000ML POUR BTL (IV SOLUTION) ×3 IMPLANT
PACK LAMINECTOMY NEURO (CUSTOM PROCEDURE TRAY) ×3 IMPLANT
ROD RADIUS 35MM (Rod) ×4 IMPLANT
SCREW 5.75X40M (Screw) ×4 IMPLANT
SCREW 6.75X40MM (Screw) ×4 IMPLANT
SCREW 7.75X35MM (Screw) ×2 IMPLANT
SPONGE SURGIFOAM ABS GEL 100 (HEMOSTASIS) ×3 IMPLANT
STRIP CLOSURE SKIN 1/2X4 (GAUZE/BANDAGES/DRESSINGS) ×3 IMPLANT
SUT VIC AB 0 CT1 18XCR BRD8 (SUTURE) ×2 IMPLANT
SUT VIC AB 0 CT1 8-18 (SUTURE) ×6
SUT VIC AB 2-0 CT1 18 (SUTURE) ×3 IMPLANT
SUT VIC AB 3-0 SH 8-18 (SUTURE) ×6 IMPLANT
SYR 20ML ECCENTRIC (SYRINGE) ×3 IMPLANT
TOWEL OR 17X24 6PK STRL BLUE (TOWEL DISPOSABLE) ×3 IMPLANT
TOWEL OR 17X26 10 PK STRL BLUE (TOWEL DISPOSABLE) ×5 IMPLANT
TRAY FOLEY CATH 14FRSI W/METER (CATHETERS) ×1 IMPLANT
TRAY FOLEY CATH 16FRSI W/METER (SET/KITS/TRAYS/PACK) ×2 IMPLANT
WATER STERILE IRR 1000ML POUR (IV SOLUTION) ×3 IMPLANT

## 2014-09-10 NOTE — Progress Notes (Signed)
Utilization review completed.  

## 2014-09-10 NOTE — Op Note (Signed)
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  Postoperative diagnosis: Same  Procedure Name: L5-S1 decompressive laminectomy with foraminotomies, Gordy Levan procedure); more than would be required for simple interbody fusion alone.  L5-S1 posterior lumbar interbody fusion utilizing interbody peek cages and local autografting  L5-S1 posterior lateral arthrodesis utilizing nonsegmental pedicle screw fixation and local autografting  Surgeon:Lizzette Carbonell A.Lory Galan, M.D.  Asst. Surgeon: Christella Noa  Anesthesia: General  Indication: 69 year old male with severe back and right lower extremity pain relating to a previous trauma. Patient has a dysplastic L5-S1 spondylolisthesis status post previous decompressive surgery on the left more than 10 years ago. Patient had a new right L5 pars interarticularis fracture and marked right L5 neural foraminal stenosis. He has failed conservative management and presents now for decompression and fusion at L5-S1.  Operative note: After induction of anesthesia, patient position prone onto Wilson frame and appropriately padded. Lumbar region prepped and draped. Incision made overlying L5-S1. Dissection performed bilaterally. Retractor placed. Fluoroscopy used. Levels confirmed. Decompressive laminectomy using Leksell rongeurs Kerrison rongeurs and high-speed drill were performed bilaterally. Inferior facetectomies of L5 and superior facetectomies at S1 were performed. L5 nerve roots were completely decompressed throughout their canal consistent with a Gill-type decompression. Osteophytic compression emanating off the right S1 vertebral body was resected and the S1 and L5 nerve root were completely decompressed on the right side. Bilateral discectomies were then performed. Disc space was then distracted to 10 mm with a 10 mm distractor left the  patient's left side. Thecal sac and nerve respect on the right side. Disc space reamed and soft tissue was removed and interspace. More a 10 mm x 22 mm x 6 capstone control implant was impacted into position and rotated into final place. Distractor was removed from patient's contralateral side. Thecal sac and nerve respect on the left side. Disc space once again reamed and prepared for interbody fusion. Morselize autograft packed into the interspace. A second cage packed with morselized autograft was impacted into position and rotated into final place. Pedicles of L5 and S1 were identified using surface landmarks and intraoperative fluoroscopy. Superficial bone overlying the pedicle was removed using high-speed drill. Each pedicles and probed using a pedicle awl. Each pedicle awl track was found to be solidly within the bone. 5.75 x 40 mm Stryker medical radius brand screws were placed bilaterally at L5. 6.75 x 40 mm screw was placed on the right at S1 and a 7.75 x 35 mm screw was placed on the left. Morselize autograft was packed posterior laterally after decorticating the lateral sacral alar and transverse processes. Short segment titanium rod was placed or the screw heads at L5 and S1. Locking caps and placed over the screw heads. Locking caps and engaged with the construct under slight compression. Final images revealed good position of the bone graft and hardware at proper upper level with normal alignment of the spine. Wounds and irrigated one final time. Gelfoam was placed topically for hemostasis. Vancomycin powder was placed the deep wound space. Wounds and close in layers with Vicryl sutures. Steri-Strips sterile dressing were applied. No apparent complications. Patient tolerated the procedure well and he returns to the recovery room postop.

## 2014-09-10 NOTE — Anesthesia Procedure Notes (Signed)
Procedure Name: Intubation Date/Time: 09/10/2014 7:53 AM Performed by: Manus Gunning, Yarethzi Branan J Pre-anesthesia Checklist: Patient identified, Timeout performed, Emergency Drugs available, Suction available and Patient being monitored Patient Re-evaluated:Patient Re-evaluated prior to inductionOxygen Delivery Method: Circle system utilized Preoxygenation: Pre-oxygenation with 100% oxygen Intubation Type: IV induction Ventilation: Mask ventilation without difficulty and Oral airway inserted - appropriate to patient size Laryngoscope Size: Mac and 4 Grade View: Grade I Tube type: Oral Tube size: 7.5 mm Number of attempts: 1 Placement Confirmation: ETT inserted through vocal cords under direct vision,  breath sounds checked- equal and bilateral and positive ETCO2 Secured at: 22 cm Tube secured with: Tape Dental Injury: Teeth and Oropharynx as per pre-operative assessment

## 2014-09-10 NOTE — Brief Op Note (Signed)
09/10/2014  10:34 AM  PATIENT:  Lucita Lora Salem  69 y.o. male  PRE-OPERATIVE DIAGNOSIS:  Spondylolisthesis, Lumbosacral region  POST-OPERATIVE DIAGNOSIS:  Spondylolisthesis, Lumbosacral region  PROCEDURE:  Procedure(s) with comments: Lumbar Five-Sacral One  Posterior lumbar interbody fusion with PEEK Cages and Pedicle Screws (N/A) - Lumbar Five-Sacral One  Posterior lumbar interbody fusion with PEEK Cages and Pedicle Screws  SURGEON:  Surgeon(s) and Role:    * Earnie Larsson, MD - Primary    * Ashok Pall, MD - Assisting  PHYSICIAN ASSISTANT:   ASSISTANTS:    ANESTHESIA:   general  EBL:  Total I/O In: 1000 [I.V.:1000] Out: 400 [Urine:200; Blood:200]  BLOOD ADMINISTERED:none  DRAINS: none   LOCAL MEDICATIONS USED:  MARCAINE     SPECIMEN:  No Specimen  DISPOSITION OF SPECIMEN:  N/A  COUNTS:  YES  TOURNIQUET:  * No tourniquets in log *  DICTATION: .Dragon Dictation  PLAN OF CARE: Admit to inpatient   PATIENT DISPOSITION:  PACU - hemodynamically stable.   Delay start of Pharmacological VTE agent (>24hrs) due to surgical blood loss or risk of bleeding: yes

## 2014-09-10 NOTE — Anesthesia Preprocedure Evaluation (Signed)
Anesthesia Evaluation  Patient identified by MRN, date of birth, ID band Patient awake    Reviewed: Allergy & Precautions, NPO status , Patient's Chart, lab work & pertinent test results  Airway Mallampati: II  TM Distance: >3 FB Neck ROM: Full    Dental  (+) Teeth Intact, Dental Advisory Given   Pulmonary former smoker,    breath sounds clear to auscultation       Cardiovascular hypertension,  Rhythm:Regular Rate:Normal     Neuro/Psych    GI/Hepatic   Endo/Other    Renal/GU      Musculoskeletal   Abdominal   Peds  Hematology   Anesthesia Other Findings   Reproductive/Obstetrics                             Anesthesia Physical Anesthesia Plan  ASA: III  Anesthesia Plan: General   Post-op Pain Management:    Induction: Intravenous  Airway Management Planned: Oral ETT  Additional Equipment:   Intra-op Plan:   Post-operative Plan: Extubation in OR  Informed Consent: I have reviewed the patients History and Physical, chart, labs and discussed the procedure including the risks, benefits and alternatives for the proposed anesthesia with the patient or authorized representative who has indicated his/her understanding and acceptance.   Dental advisory given  Plan Discussed with: CRNA and Anesthesiologist  Anesthesia Plan Comments:         Anesthesia Quick Evaluation  

## 2014-09-10 NOTE — Transfer of Care (Signed)
Immediate Anesthesia Transfer of Care Note  Patient: Alexander Cox  Procedure(s) Performed: Procedure(s) with comments: Lumbar Five-Sacral One  Posterior lumbar interbody fusion with PEEK Cages and Pedicle Screws (N/A) - Lumbar Five-Sacral One  Posterior lumbar interbody fusion with PEEK Cages and Pedicle Screws  Patient Location: PACU  Anesthesia Type:General  Level of Consciousness: awake  Airway & Oxygen Therapy: Patient Spontanous Breathing and Patient connected to nasal cannula oxygen  Post-op Assessment: Report given to RN and Post -op Vital signs reviewed and stable  Post vital signs: Reviewed and stable  Last Vitals:  Filed Vitals:   09/10/14 1147  BP:   Pulse: 61  Temp:   Resp: 3    Complications: No apparent anesthesia complications

## 2014-09-10 NOTE — Anesthesia Postprocedure Evaluation (Signed)
  Anesthesia Post-op Note  Patient: Alexander Cox  Procedure(s) Performed: Procedure(s) with comments: Lumbar Five-Sacral One  Posterior lumbar interbody fusion with PEEK Cages and Pedicle Screws (N/A) - Lumbar Five-Sacral One  Posterior lumbar interbody fusion with PEEK Cages and Pedicle Screws  Patient Location: PACU  Anesthesia Type:General  Level of Consciousness: awake, alert  and oriented  Airway and Oxygen Therapy: Patient Spontanous Breathing and Patient connected to nasal cannula oxygen  Post-op Pain: mild  Post-op Assessment: Post-op Vital signs reviewed, Patient's Cardiovascular Status Stable, Respiratory Function Stable, Patent Airway and Pain level controlled  Post-op Vital Signs: stable  Last Vitals:  Filed Vitals:   09/10/14 1541  BP: 154/66  Pulse: 70  Temp:   Resp: 16    Complications: No apparent anesthesia complications

## 2014-09-10 NOTE — H&P (Signed)
Alexander Cox is an 69 y.o. male.   Chief Complaint: Back and right leg pain HPI: 69 year old male with severe back and right lower extremity pain failing conservative management. Workup demonstrates evidence of an unstable L5-S1 spondylolisthesis with Alexander Cox right L5 pars interarticularis fracture and marked right L5 neural foraminal stenosis. Patient status post decompressive surgery on the left at L5-S1. Patient has failed conservative management and presents now for lumbar decompression and fusion at L5-S1.  Past Medical History  Diagnosis Date  . Hypertension   . High cholesterol   . Celiac disease   . Asthma     hx child  . History of kidney stones     Past Surgical History  Procedure Laterality Date  . Hernia repair Left     teen  . Back surgery  7    Family History  Problem Relation Age of Onset  . Hypertension Paternal Grandfather    Social History:  reports that he quit smoking 3 days ago. His smoking use included Cigarettes. His smokeless tobacco use includes Snuff. He reports that he drinks alcohol. He reports that he does not use illicit drugs.  Allergies:  Allergies  Allergen Reactions  . Gluten Meal Other (See Comments)    Upset stomach    Medications Prior to Admission  Medication Sig Dispense Refill  . aspirin EC 81 MG tablet Take 81 mg by mouth daily.    . calcium carbonate (OS-CAL) 600 MG TABS tablet Take 600 mg by mouth daily with breakfast.    . Cholecalciferol (VITAMIN D PO) Take 1,000 Units by mouth daily.    Marland Kitchen gabapentin (NEURONTIN) 600 MG tablet Take 1 tablet (600 mg total) by mouth 4 (four) times daily. 1 tablet twice daily and 2 tablets at night (Patient taking differently: Take 600 mg by mouth 4 (four) times daily. ) 120 tablet 3  . hydrochlorothiazide (HYDRODIURIL) 25 MG tablet Take 25 mg by mouth daily.     . hydroxypropyl methylcellulose / hypromellose (ISOPTO TEARS / GONIOVISC) 2.5 % ophthalmic solution Place 1 drop into both eyes as needed  for dry eyes.    Marland Kitchen lisinopril (PRINIVIL,ZESTRIL) 5 MG tablet Take 5 mg by mouth daily.    . Multiple Vitamin (MULTIVITAMIN WITH MINERALS) TABS tablet Take 1 tablet by mouth daily.    Marland Kitchen omega-3 acid ethyl esters (LOVAZA) 1 G capsule Take 1 g by mouth daily.    . simvastatin (ZOCOR) 20 MG tablet Take 20 mg by mouth daily.    . polyethylene glycol (MIRALAX / GLYCOLAX) packet Take 17 g by mouth 2 (two) times daily. (Patient not taking: Reported on 09/02/2014) 14 each 0    No results found for this or any previous visit (from the past 48 hour(s)). No results found.  Pertinent items are noted in HPI.  Blood pressure 119/63, pulse 61, temperature 97.6 F (36.4 C), temperature source Oral, resp. rate 20, weight 76.658 kg (169 lb), SpO2 97 %.  The patient is awake and alert. He is oriented and appropriate. Cranial nerve function is intact. Motor examination 5 over 5 in both upper extremities. 5 over 5 left lower extremity. 5 over 5 right lower extremity except 4 minus over 5 anterior tibialis and 3 over 5 extensor hallucis longus. Decreased sensation pinprick and light touch on right L5 dermatome otherwise sensation normal. Deep tendon reflexes normal active. Gait and posture reasonably normal. Examination head ears eyes nose and throat is unremarkable. Chest and abdomen are benign. Extremities are free from  injury deformity. Assessment/Plan L5-S1 grade 1 unstable spondylolisthesis with severe neural foraminal stenosis. Plan L5-S1 decompressive laminectomy with foraminotomies Alexander Cox procedure) with L5-S1 posterior lumbar interbody fusion utilizing interbody peek cages, locally harvested autograft, and combined with posterior lateral arthrodesis utilizing nonsegmental pedicle screw fixation. Risks and benefits of been explained. Patient wishes to proceed.  Alexander Cox Alexander Cox 09/10/2014, 7:36 AM

## 2014-09-11 MED ORDER — OXYCODONE-ACETAMINOPHEN 5-325 MG PO TABS: 1.0000 | ORAL_TABLET | ORAL | Status: DC | PRN

## 2014-09-11 MED ORDER — DIAZEPAM 5 MG PO TABS: 5.0000 mg | ORAL_TABLET | Freq: Four times a day (QID) | ORAL | Status: DC | PRN

## 2014-09-11 NOTE — Discharge Instructions (Signed)

## 2014-09-11 NOTE — Plan of Care (Signed)
Problem: Consults Goal: Diagnosis - Spinal Surgery Outcome: Completed/Met Date Met:  09/11/14 Thoraco/Lumbar Spine Fusion

## 2014-09-11 NOTE — Progress Notes (Signed)
Patient alert and oriented, mae's well, voiding adequate amount of urine, swallowing without difficulty, no c/o pain. Patient discharged home with family. Script and discharged instructions given to patient. Patient and family stated understanding of d/c instructions given and has an appointment with MD. 

## 2014-09-11 NOTE — Evaluation (Signed)
Physical Therapy Evaluation and Discharge Patient Details Name: Alexander Cox MRN: [id number] DOB: [birth year] Today's Date: 09/11/2014   History of Present Illness  Pt is a 69 y/o male admitted s/p L5-S1 decompressive laminectomy with foraminotomies.  Clinical Impression  Patient evaluated by Physical Therapy with no further acute PT needs identified. All education has been completed and the patient has no further questions. At the time of PT eval pt was mod I with all mobility. Pt with extensive back history and is educated on back precautions, log roll, and general safety awareness. See below for any follow-up Physial Therapy or equipment needs. PT is signing off. Thank you for this referral.     Follow Up Recommendations No PT follow up;Supervision - Intermittent    Equipment Recommendations  None recommended by PT    Recommendations for Other Services       Precautions / Restrictions Precautions Precautions: Fall;Back Required Braces or Orthoses: Spinal Brace Spinal Brace: Lumbar corset Restrictions Weight Bearing Restrictions: No      Mobility  Bed Mobility               General bed mobility comments: Pt received walking around in room with wife  Transfers Overall transfer level: Modified independent Equipment used: None Transfers: Sit to/from Stand              Ambulation/Gait Ambulation/Gait assistance: Modified independent (Device/Increase time) Ambulation Distance (Feet): 400 Feet Assistive device: None Gait Pattern/deviations: Step-through pattern;Decreased stride length Gait velocity: Decreased Gait velocity interpretation: Below normal speed for age/gender General Gait Details: No unsteadiness or LOB noted. Pt did not require physical assistance.  Stairs Stairs: Yes Stairs assistance: Modified independent (Device/Increase time) Stair Management: One rail Right Number of Stairs: 8 General stair comments: No physical assist required. VC's  for step-to gait pattern for safety and energy conservation.   Wheelchair Mobility    Modified Rankin (Stroke Patients Only)       Balance Overall balance assessment: No apparent balance deficits (not formally assessed)                                           Pertinent Vitals/Pain Pain Assessment: Faces Faces Pain Scale: Hurts a little bit Pain Location: Back Pain Descriptors / Indicators: Operative site guarding Pain Intervention(s): Limited activity within patient's tolerance;Monitored during session;Repositioned    Home Living Family/patient expects to be discharged to:: Private residence Living Arrangements: Spouse/significant other Available Help at Discharge: Family;Available 24 hours/day Type of Home: House Home Access: Stairs to enter Entrance Stairs-Rails: None Entrance Stairs-Number of Steps: 5 Home Layout: One level Home Equipment: Walker - 4 wheels;Cane - single point;Shower seat Additional Comments: tub shower with curtain, standard height toliets    Prior Function Level of Independence: Independent               Hand Dominance   Dominant Hand: Right    Extremity/Trunk Assessment   Upper Extremity Assessment: Defer to OT evaluation;Overall WFL for tasks assessed           Lower Extremity Assessment: Overall WFL for tasks assessed         Communication   Communication: No difficulties  Cognition Arousal/Alertness: Awake/alert Behavior During Therapy: WFL for tasks assessed/performed Overall Cognitive Status: Within Functional Limits for tasks assessed  General Comments      Exercises        Assessment/Plan    PT Assessment Patent does not need any further PT services  PT Diagnosis Acute pain   PT Problem List    PT Treatment Interventions     PT Goals (Current goals can be found in the Care Plan section) Acute Rehab PT Goals PT Goal Formulation: All assessment and education  complete, DC therapy    Frequency     Barriers to discharge        Co-evaluation               End of Session Equipment Utilized During Treatment: Back brace Activity Tolerance: Patient tolerated treatment well Patient left: in chair;with call bell/phone within reach;with family/visitor present;Other (comment) (OT present) Nurse Communication: Mobility status         Time: 2111-5520 PT Time Calculation (min) (ACUTE ONLY): 9 min   Charges:   PT Evaluation $Initial PT Evaluation Tier I: 1 Procedure     PT G Codes:        Rolinda Roan 10-11-2014, 8:15 AM   Rolinda Roan, PT, DPT Acute Rehabilitation Services Pager: [phone]

## 2014-09-11 NOTE — Progress Notes (Signed)
OT Cancellation Note and Discharge  Patient Details Name: Alexander Cox MRN: [id number] DOB: [birth year]   Cancelled Treatment:    Reason Eval/Treat Not Completed: OT screened, no needs identified, will sign off. Pt and wife report that they remember all the precautions and ways he is suppose to do things from prior back issues/surgeries/breaks. No issues identified, we will sign off.  Almon Register [phone] 09/11/2014, 8:26 AM

## 2014-09-11 NOTE — Discharge Summary (Signed)
Physician Discharge Summary  Patient ID: Alexander Cox MRN: [id number] DOB/AGE: [birth year] 69 y.o.  Admit date: 09/10/2014 Discharge date: 09/11/2014  Admission Diagnoses:  Discharge Diagnoses:  Principal Problem:   Spondylolisthesis at L5-S1 level   Discharged Condition: good  Hospital Course: The patient was admitted to hospital where he underwent and, gait and lumbar fusion. Postoperative use doing very well. Back and lower extremity pain is much improved. Strength sensation are intact. Patient ambulating without difficulty. Ready for discharge home.  Consults:   Significant Diagnostic Studies:   Treatments:   Discharge Exam: Blood pressure 110/65, pulse 73, temperature 97.8 F (36.6 C), temperature source Oral, resp. rate 16, weight 76.658 kg (169 lb), SpO2 100 %. Awake and alert. Oriented and appropriate. Motor and sensory function intact. Wound clean and dry. Chest and abdomen benign.  Disposition: 06-Home-Health Care Svc     Medication List    TAKE these medications        aspirin EC 81 MG tablet  Take 81 mg by mouth daily.     calcium carbonate 600 MG Tabs tablet  Commonly known as:  OS-CAL  Take 600 mg by mouth daily with breakfast.     diazepam 5 MG tablet  Commonly known as:  VALIUM  Take 1-2 tablets (5-10 mg total) by mouth every 6 (six) hours as needed for muscle spasms.     gabapentin 600 MG tablet  Commonly known as:  NEURONTIN  Take 1 tablet (600 mg total) by mouth 4 (four) times daily. 1 tablet twice daily and 2 tablets at night     hydrochlorothiazide 25 MG tablet  Commonly known as:  HYDRODIURIL  Take 25 mg by mouth daily.     hydroxypropyl methylcellulose / hypromellose 2.5 % ophthalmic solution  Commonly known as:  ISOPTO TEARS / GONIOVISC  Place 1 drop into both eyes as needed for dry eyes.     lisinopril 5 MG tablet  Commonly known as:  PRINIVIL,ZESTRIL  Take 5 mg by mouth daily.     multivitamin with minerals Tabs tablet  Take 1  tablet by mouth daily.     omega-3 acid ethyl esters 1 G capsule  Commonly known as:  LOVAZA  Take 1 g by mouth daily.     oxyCODONE-acetaminophen 5-325 MG per tablet  Commonly known as:  PERCOCET/ROXICET  Take 1-2 tablets by mouth every 4 (four) hours as needed for moderate pain.     polyethylene glycol packet  Commonly known as:  MIRALAX / GLYCOLAX  Take 17 g by mouth 2 (two) times daily.     simvastatin 20 MG tablet  Commonly known as:  ZOCOR  Take 20 mg by mouth daily.     VITAMIN D PO  Take 1,000 Units by mouth daily.           Follow-up Information    Follow up with Charlie Pitter, MD.   Specialty:  Neurosurgery   Contact information:   [address] [place] [address] [phone]       Signed: Charlie Pitter 09/11/2014, 9:16 AM

## 2014-09-13 MED FILL — Sodium Chloride IV Soln 0.9%: INTRAVENOUS | Qty: 1000 | Status: AC

## 2014-09-13 MED FILL — Heparin Sodium (Porcine) Inj 1000 Unit/ML: INTRAMUSCULAR | Qty: 30 | Status: AC

## 2014-09-29 ENCOUNTER — Ambulatory Visit: Payer: Medicare Other | Admitting: Physical Medicine & Rehabilitation

## 2015-10-04 ENCOUNTER — Other Ambulatory Visit: Payer: Self-pay | Admitting: Family Medicine

## 2015-10-04 ENCOUNTER — Ambulatory Visit
Admission: RE | Admit: 2015-10-04 | Discharge: 2015-10-04 | Disposition: A | Discharge status: Home / Self Care | Payer: Medicare Other | Source: Ambulatory Visit | Attending: Family Medicine | Admitting: Family Medicine

## 2015-10-04 DIAGNOSIS — R079 Chest pain, unspecified: Secondary | ICD-10-CM

## 2015-10-06 ENCOUNTER — Other Ambulatory Visit: Payer: Self-pay | Admitting: Family Medicine

## 2015-10-07 ENCOUNTER — Ambulatory Visit
Admission: RE | Admit: 2015-10-07 | Discharge: 2015-10-07 | Disposition: A | Discharge status: Home / Self Care | Payer: Medicare Other | Source: Ambulatory Visit | Attending: Family Medicine | Admitting: Family Medicine

## 2015-10-07 ENCOUNTER — Other Ambulatory Visit: Payer: Self-pay | Admitting: Family Medicine

## 2015-10-07 DIAGNOSIS — R140 Abdominal distension (gaseous): Secondary | ICD-10-CM

## 2017-08-14 HISTORY — PX: BIOPSY PROSTATE: PRO28

## 2017-08-14 HISTORY — PX: TRANSRECTAL ULTRASOUND: SHX5146

## 2017-08-26 ENCOUNTER — Other Ambulatory Visit: Payer: Self-pay | Admitting: Urology

## 2017-08-26 DIAGNOSIS — C61 Malignant neoplasm of prostate: Secondary | ICD-10-CM

## 2017-09-02 ENCOUNTER — Encounter (HOSPITAL_COMMUNITY)
Admission: RE | Admit: 2017-09-02 | Discharge: 2017-09-02 | Disposition: A | Discharge status: Home / Self Care | Payer: Medicare Other | Source: Ambulatory Visit | Attending: Urology | Admitting: Urology

## 2017-09-02 DIAGNOSIS — C61 Malignant neoplasm of prostate: Secondary | ICD-10-CM | POA: Insufficient documentation

## 2017-09-02 DIAGNOSIS — X58XXXA Exposure to other specified factors, initial encounter: Secondary | ICD-10-CM | POA: Diagnosis not present

## 2017-09-02 DIAGNOSIS — R937 Abnormal findings on diagnostic imaging of other parts of musculoskeletal system: Secondary | ICD-10-CM | POA: Diagnosis not present

## 2017-09-02 DIAGNOSIS — M4854XA Collapsed vertebra, not elsewhere classified, thoracic region, initial encounter for fracture: Secondary | ICD-10-CM | POA: Insufficient documentation

## 2017-09-02 MED ORDER — TECHNETIUM TC 99M MEDRONATE IV KIT
21.0000 | PACK | Freq: Once | INTRAVENOUS | Status: AC | PRN
  Administered 2017-09-02: 21 via INTRAVENOUS

## 2017-09-06 ENCOUNTER — Ambulatory Visit (HOSPITAL_COMMUNITY)
Admission: RE | Admit: 2017-09-06 | Discharge: 2017-09-06 | Disposition: A | Discharge status: Home / Self Care | Payer: Medicare Other | Source: Ambulatory Visit | Attending: Urology | Admitting: Urology

## 2017-09-06 ENCOUNTER — Other Ambulatory Visit: Payer: Self-pay | Admitting: Urology

## 2017-09-06 DIAGNOSIS — M4854XA Collapsed vertebra, not elsewhere classified, thoracic region, initial encounter for fracture: Secondary | ICD-10-CM | POA: Insufficient documentation

## 2017-09-06 DIAGNOSIS — C61 Malignant neoplasm of prostate: Secondary | ICD-10-CM | POA: Diagnosis present

## 2017-09-12 ENCOUNTER — Encounter: Payer: Self-pay | Admitting: Radiation Oncology

## 2017-10-01 ENCOUNTER — Encounter: Payer: Self-pay | Admitting: Radiation Oncology

## 2017-10-01 ENCOUNTER — Other Ambulatory Visit: Payer: Self-pay | Admitting: Urology

## 2017-10-01 NOTE — Progress Notes (Signed)
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  Biopsies revealed:  08/14/2017   Past/Anticipated interventions by urology, if any:  -08/14/2017 Transrectal ultrasound with prostate biopsy -[facility] - No received yet. Patient does not have a follow up appointment with Dr. Jeffie Pollock for ADT.  Past/Anticipated interventions by medical oncology, if any: no  Weight changes, if any: no  Bowel/Bladder complaints, if any: IPSS 14. He has the sensation of not fully emptying his bladder and has to urinate again less than 2 hours after last void. Has to start and stop again several times while urinating, and finds it difficult to postpone urination. Reports a weak urinary stream and occasionally has to push/strain to begin urination.  Denies dysuria, hematuria, leakage or incontinence.  Nausea/Vomiting, if any: no  Pain issues, if any:  Right foot and leg related to effects of back surgery  SAFETY ISSUES:  Prior radiation? no  Pacemaker/ICD? no  Possible current pregnancy? No  Is the patient on methotrexate? no  Current Complaints / other details:  72 year old male. Married with two sons. Resides in [place]. Father with hx of radiation therapy.   His confidence that he can get an erection is moderate, and it has always been difficult for him maintain his erection to completion of intercourse (he and his wife are ok with no activity). Has atrophic right and left testes.

## 2017-10-02 ENCOUNTER — Ambulatory Visit
Admission: RE | Admit: 2017-10-02 | Discharge: 2017-10-02 | Disposition: A | Discharge status: Home / Self Care | Payer: Medicare Other | Source: Ambulatory Visit | Attending: Radiation Oncology | Admitting: Radiation Oncology

## 2017-10-02 ENCOUNTER — Encounter: Payer: Self-pay | Admitting: Radiation Oncology

## 2017-10-02 ENCOUNTER — Other Ambulatory Visit: Payer: Self-pay

## 2017-10-02 ENCOUNTER — Encounter

## 2017-10-02 DIAGNOSIS — Z79899 Other long term (current) drug therapy: Secondary | ICD-10-CM | POA: Insufficient documentation

## 2017-10-02 DIAGNOSIS — E7800 Pure hypercholesterolemia, unspecified: Secondary | ICD-10-CM | POA: Diagnosis not present

## 2017-10-02 DIAGNOSIS — Z87891 Personal history of nicotine dependence: Secondary | ICD-10-CM | POA: Insufficient documentation

## 2017-10-02 DIAGNOSIS — C61 Malignant neoplasm of prostate: Secondary | ICD-10-CM

## 2017-10-02 DIAGNOSIS — J45909 Unspecified asthma, uncomplicated: Secondary | ICD-10-CM | POA: Diagnosis not present

## 2017-10-02 DIAGNOSIS — I10 Essential (primary) hypertension: Secondary | ICD-10-CM | POA: Diagnosis not present

## 2017-10-02 DIAGNOSIS — Z87442 Personal history of urinary calculi: Secondary | ICD-10-CM | POA: Insufficient documentation

## 2017-10-02 DIAGNOSIS — Z7982 Long term (current) use of aspirin: Secondary | ICD-10-CM | POA: Insufficient documentation

## 2017-10-02 HISTORY — DX: Malignant neoplasm of prostate: C61

## 2017-10-02 NOTE — Progress Notes (Signed)
Radiation Oncology         [phone]) [phone] ________________________________  Initial outpatient Consultation  Name: Alexander Cox MRN: [id number]  Date: 10/02/2017  DOB: November 15, 1945  KG:MWNU, Nathen May, MD  Irine Seal, MD   REFERRING PHYSICIAN: Irine Seal, MD  DIAGNOSIS: 72 y.o. 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 Malignant neoplasm of prostate ([place]) [place] is a 72 y.o. gentleman.  He was noted to have an elevated PSA of 4.73 by his primary care physician, Dr. Brigitte Pulse.  His previous PSA in January 2018 was 3.16 and prior to that it was 1.18 in January 2016.  Accordingly, he was referred for evaluation in urology by Dr. Jeffie Pollock on 08/14/17,  digital rectal examination was performed at that time revealing the right lobe firm at the apex.  The patient proceeded to transrectal ultrasound with 12 biopsies of the prostate on 08/14/2017.  The prostate volume measured 24 cc.  Out of 12 core biopsies, 9 were positive.  The maximum Gleason score was 4+3, and this was seen in the right base and right base lateral.  Additionally, there was Gleason 3+4 disease in the left base, left mid gland, right mid gland, right mid lateral, right apex and right apex lateral as well as Gleason 3+3 disease in the left mid lateral.    He had a CT of the Pelvis on 09/02/2017 for disease staging.this scan did not demonstrate any pattern logically enlarged lymph nodes or evidence of metastatic disease.  He had a bone scan on 09/02/17 as well and this revealed nonspecific subtle increased tracer localization at the lateral mid and Left chest approximately eighth rib, of uncertain etiology.  Follow-up rib imaging on 09/06/2017 was negative for any concerning lesions or acute processes.  The patient reviewed the biopsy results with his urologist and he has kindly been referred today for discussion of  potential radiation treatment options.          PREVIOUS RADIATION THERAPY: No  PAST MEDICAL HISTORY:  has a past medical history of Asthma, Celiac disease, High cholesterol, History of kidney stones, Hypertension, and Prostate cancer ([place]).    PAST SURGICAL HISTORY: Past Surgical History:  Procedure Laterality Date  . BACK SURGERY  06  . BIOPSY PROSTATE  08/14/2017  . HERNIA REPAIR Left    [age]  . TRANSRECTAL ULTRASOUND  08/14/2017    FAMILY HISTORY: family history includes Hypertension in his paternal grandfather.  SOCIAL HISTORY:  reports that he quit smoking about 50 years ago. His smoking use included cigarettes. He has a 2.50 pack-year smoking history. His smokeless tobacco use includes snuff. He reports that he drinks alcohol. He reports that he does not use drugs.  ALLERGIES: Gluten meal  MEDICATIONS:  Current Outpatient Medications  Medication Sig Dispense Refill  . aspirin EC 81 MG tablet Take 81 mg by mouth daily.    . calcium carbonate (OS-CAL) 600 MG TABS tablet Take 600 mg by mouth daily with breakfast.    . Cholecalciferol (VITAMIN D PO) Take 1,000 Units by mouth daily.    Marland Kitchen gabapentin (NEURONTIN) 600 MG tablet Take 1 tablet (600 mg total) by mouth 4 (four) times daily. 1 tablet twice daily and 2 tablets at night (Patient taking differently: Take 600 mg by mouth 4 (four) times daily. ) 120 tablet 3  . hydrochlorothiazide (HYDRODIURIL) 25  MG tablet Take 25 mg by mouth daily.     Marland Kitchen lisinopril (PRINIVIL,ZESTRIL) 5 MG tablet Take 5 mg by mouth daily.    . Multiple Vitamin (MULTIVITAMIN WITH MINERALS) TABS tablet Take 1 tablet by mouth daily.    Marland Kitchen omega-3 acid ethyl esters (LOVAZA) 1 G capsule Take 1 g by mouth daily.    . simvastatin (ZOCOR) 20 MG tablet Take 20 mg by mouth daily.    . Tetrahydroz-Polyvinyl Al-Povid (CLEAR EYES TRIPLE ACTION OP) Apply to eye.    . RABEprazole (ACIPHEX) 20 MG tablet      No current facility-administered medications for this  encounter.     REVIEW OF SYSTEMS:  On review of systems, the patient reports that he is doing well overall.  He denies any chest pain, shortness of breath, cough, fevers, chills, night sweats, unintended weight changes.  He denies any bowel disturbances, and denies abdominal pain, nausea or vomiting.  He denies any new musculoskeletal or joint aches or pains. His IPSS was 14, indicating moderate urinary symptoms. He reports a weak urinary stream, hesitancy and the sensation of not fully emptying his bladder, feeling the need to urinate again less than 2 hours after last void. He also reports intermittency and finds it difficult to postpone urination.   Denies dysuria, hematuria, leakage or incontinence.   He is unable to complete sexual activity with most attempts due to severe ED. A complete review of systems is obtained and is otherwise negative.    PHYSICAL EXAM:  In general this is a well appearing Caucasian male in no acute distress.  He is alert and oriented x4 and appropriate throughout the examination. HEENT reveals that the patient is normocephalic, atraumatic. EOMs are intact. PERRLA. Skin is intact without any evidence of gross lesions. Cardiovascular exam reveals a regular rate and rhythm, no clicks rubs or murmurs are auscultated. Chest is clear to auscultation bilaterally. Lymphatic assessment is performed and does not reveal any adenopathy in the cervical, supraclavicular, axillary, or inguinal chains. Abdomen has active bowel sounds in all quadrants and is intact. The abdomen is soft, non tender, non distended. Lower extremities are negative for pretibial pitting edema, deep calf tenderness, cyanosis or clubbing.   KPS = 100  100 - Normal; no complaints; no evidence of disease. 90   - Able to carry on normal activity; minor signs or symptoms of disease. 80   - Normal activity with effort; some signs or symptoms of disease. 18   - Cares for self; unable to carry on normal activity or to  do active work. 60   - Requires occasional assistance, but is able to care for most of his personal needs. 50   - Requires considerable assistance and frequent medical care. 29   - Disabled; requires special care and assistance. 73   - Severely disabled; hospital admission is indicated although death not imminent. 33   - Very sick; hospital admission necessary; active supportive treatment necessary. 10   - Moribund; fatal processes progressing rapidly. 0     - Dead  Karnofsky DA, Abelmann Verlot, Craver LS and Burchenal [facility] [phone]) The use of the nitrogen mustards in the palliative treatment of carcinoma: with particular reference to bronchogenic carcinoma Cancer 1 634-56   LABORATORY DATA:  Lab Results  Component Value Date   WBC 6.6 09/07/2014   HGB 14.8 09/07/2014   HCT 44.3 09/07/2014   MCV 89.3 09/07/2014   PLT 250 09/07/2014   Lab Results  Component Value  Date   NA 138 09/07/2014   K 3.9 09/07/2014   CL 102 09/07/2014   CO2 27 09/07/2014   Lab Results  Component Value Date   ALT 55 (H) 12/07/2013   AST 41 (H) 12/07/2013   ALKPHOS 164 (H) 12/07/2013   BILITOT 0.8 12/07/2013     RADIOGRAPHY: Dg Ribs Unilateral Left  Result Date: 09/06/2017 CLINICAL DATA:  Prostate carcinoma with questionable abnormality on bone scan EXAM: LEFT RIBS-3+ VIEW COMPARISON:  Whole-body bone scan September 02, 2017. Chest radiograph Oct 04, 2015 FINDINGS: Frontal chest as well as oblique and cone-down rib images obtained. Lungs are clear. Heart size and pulmonary vascularity are normal. No adenopathy. There is a chronic wedge compression fracture at T11, unchanged. There are no appreciable blastic or lytic bone lesions. No pneumothorax or pleural effusion. Postoperative change noted in the lower lumbar spine. IMPRESSION: Chronic wedging of the T11 vertebral body. No blastic or lytic bone lesions. No rib fracture. Lungs clear. No adenopathy. Electronically Signed   By: Lowella Grip III M.D.   On:  09/06/2017 10:57      IMPRESSION/PLAN : Mr. Aguas is a 72 y.o. 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 His T-Stage, Gleason's Score, and PSA put him into the unfavorable intermediate risk group.  Accordingly he is eligible for a variety of potential treatment options including ADT in combination with either 5.5 weeks of daily external beam radiation or a combination of 5 weeks of external beam radiation followed by brachytherpy.  Today we reviewed the findings and workup thus far.  We discussed the natural history of prostate cancer.  We reviewed the the implications of T-stage, Gleason's Score, and PSA on decision-making and outcomes in prostate cancer. We discussed radiation treatment in the management of prostate cancer with regard to the logistics and delivery of external beam radiation treatment as well as the logistics and delivery of prostate brachytherapy.  We compared and contrasted each of these approaches and also compared these against prostatectomy.  We discussed the role of SpaceOAR in reducing the rectal toxicity associated with radiotherapy. We also detailed the role of ADT in the treatment of unfavorable intermediate risk prostate cancer and outlined the associated side effects that could be expected with this therapy.  At the conclusion of today's conversation, the patient expressed interest in ADT combined with a 5.5 week course of daily radiotherapy. We will share our findings with Dr. Jeffie Pollock and move forward with scheduling a follow up visit to initiate ADT in the near future.  We will also coordinate placement of SpaceOAR gel and three gold fiducial markers prior to CT [facility] for treatment planning. We anticipate beginning radiotherapy approximately 8 weeks after starting ADT.    We enjoyed meeting with him and his wife today, and will look forward to participating in the care of this very nice gentleman.   We spent 60  minutes minutes face to face with the patient and more than 50% of that time was spent in counseling and/or coordination of care.    Nicholos Johns, PA-C    Tyler Pita, MD  [facility] Direct Dial: [phone]  Fax: [phone] [email or web address]  Skype  [email or web address]  This document serves as a record of services personally performed by Tyler Pita, MD and Ashlyn Bruning PA-C. It was created on their behalf by Delton Coombes, a trained medical scribe. The creation of this record is based  on the scribe's personal observations and the provider's statements to them.

## 2017-10-02 NOTE — Progress Notes (Signed)
See progress note under physician encounter. 

## 2017-10-03 ENCOUNTER — Encounter (HOSPITAL_COMMUNITY): Payer: Self-pay | Admitting: *Deleted

## 2017-10-03 ENCOUNTER — Other Ambulatory Visit: Payer: Self-pay

## 2017-10-04 ENCOUNTER — Telehealth: Payer: Self-pay | Admitting: *Deleted

## 2017-10-04 NOTE — Telephone Encounter (Signed)
Called patient to update about ADT appt., told patient that I have been put in Dr. Ralene Muskrat nurse vm, and as soon as they call me and give me an appt. date and time and I will call him with the info., patient verified understanding this.

## 2017-10-09 NOTE — H&P (Signed)
Office Visit Report     09/24/2017   --------------------------------------------------------------------------------   Alexander Cox  MRN: [id number]  PRIMARY CARE:    DOB: 1945/11/21, 72 year old Male old Male  REFERRING:  Alexander D. Brigitte Pulse, MD  SSN: [id number]  PROVIDER:  Irine Cox, M.D.    TREATING:  Alexander Cox    LOCATION:  [facility] [phone]   --------------------------------------------------------------------------------   CC: I have a history of kidney stones.  HPI: Alexander Cox is a 72 year-old male established patient who is here for F/U due to a history of renal calculi.  Incidentally found on CT imaging of the pelvis done during disease staging for prostate cancer was a 51m right UVJ stone. The patient was w/o symptom from the stone. He f/u today for re-evaluation regarding the calculus. He reports no changes in voiding symptoms. Denies right sided pain or discomfort. No increase in LUTS. Denies any recent passage of stone material. He's passed two stones in the remote past. No hx of procedural intervention.   The patient was last seen 09/11/2017. The patient's stone was on his right side. He did not pass a stone since the last office visit.   The patient has not had any flank pain since they were last seen. He has no seen blood in his urine since the last visit. He is not currently having flank pain, back pain, groin pain, nausea, vomiting, fever or chills. He has never had surgical treatment for calculi in the past.     CC: AUA Questions Scoring.  HPI:     AUA Symptom Score: Less than 50% of the time he has the sensation of not emptying his bladder completely when finished urinating. 50% of the time he has to urinate again fewer than two hours after he has finished urinating. 50% of the time he has to start and stop again several times when he urinates. Less than 20% of the time he finds it difficult to postpone urination. More than 50% of the time he  has a weak urinary stream. Less than 50% of the time he has to push or strain to begin urination. He has to get up to urinate 1 time from the time he goes to bed until the time he gets up in the morning.   Calculated AUA Symptom Score: 16    ALLERGIES: No Allergies    MEDICATIONS: Hydrochlorothiazide 25 mg tablet  Lisinopril 20 mg tablet  Simvastatin 20 mg tablet  Aspirin Ec 81 mg tablet, delayed release  Calcium  Fish Oil  Gabapentin 300 mg capsule  Multivitamin  Rabeprazole Sodium 20 mg tablet, delayed release  Vitamin D3     GU PSH: Locm 300-399Mg/Ml Iodine,1Ml - 09/02/2017 Prostate Needle Biopsy - 08/14/2017    NON-GU PSH: Back surgery Hernia Repair Surgical Pathology, Gross And Microscopic Examination For Prostate Needle - 08/14/2017    GU PMH: ED due to arterial insufficiency - 09/11/2017 Prostate Cancer, T2a N0 M0 Gleason 7(4+3) high volume prostate cancer in a 280mgland. He has intermediate to high risk disease. He is not a good candidate for AS. He has moderate LUTS and severe ED. I reviewed the treatment options as noted below and will have him see Dr. MaTammi Klippelor consideration of radiation and ADT which he will need for 18 months. At his age of [age] his high risk of locally advanced disease, I think this is his best option. I did mention the PROGRESS trial of ADT with RP  but he is most interested in radiation. - 09/11/2017 Ureteral calculus, I failed to mention the stone during our visit and will call him to discuss that further. - 09/11/2017 Elevated PSA - 08/14/2017, He has a rapidly rising PSA with right apical induration and a family history of prostate cancer. I will get him set up for a biopsy and reviewed the risks of bleeding, infection and voiding difficulty. Levaquin sent. , - 07/24/2017 Prostate nodule w/ LUTS, He has mild-moderate LUTs. 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 I will check a testosterone since I have  found low testosterone to be associated with more aggressive prostate cancer. - 07/24/2017 Weak Urinary Stream - 07/24/2017    NON-GU PMH: Asthma GERD Hypercholesterolemia Hypertension    FAMILY HISTORY: Alzheimer's Disease - Alexander Cox Hypertension - Mother Prostate Cancer - Alexander Cox   SOCIAL HISTORY: Marital Status: Married Preferred Language: English; Race: White Current Smoking Status: Patient does not smoke anymore.   Tobacco Use Assessment Completed: Used Tobacco in last 30 days? Drinks 1 caffeinated drink per day.     Notes: 2 sons    REVIEW OF SYSTEMS:    GU Review Male:   Patient reports frequent urination, get up at night to urinate, stream starts and stops, and erection problems. Patient denies hard to postpone urination, burning/ pain with urination, leakage of urine, trouble starting your stream, have to strain to urinate , and penile pain.  Gastrointestinal (Upper):   Patient denies nausea, vomiting, and indigestion/ heartburn.  Gastrointestinal (Lower):   Patient denies diarrhea and constipation.  Constitutional:   Patient denies fever, night sweats, weight loss, and fatigue.  Skin:   Patient reports itching. Patient denies skin rash/ lesion.  Eyes:   Patient denies blurred vision and double vision.  Ears/ Nose/ Throat:   Patient denies sinus problems and sore throat.  Hematologic/Lymphatic:   Patient reports easy bruising. Patient denies swollen glands.  Cardiovascular:   Patient denies leg swelling and chest pains.  Respiratory:   Patient denies cough and shortness of breath.  Endocrine:   Patient denies excessive thirst.  Musculoskeletal:   Patient denies back pain and joint pain.  Neurological:   Patient denies headaches and dizziness.  Psychologic:   Patient denies depression and anxiety.   Notes: Updated from previous visit 07/24/2017 with review from patient as noted above.   VITAL SIGNS:      09/24/2017 08:56 AM  BP 149/72 mmHg  Heart Rate 40 /min   Temperature 97.5 F / 36.3 C   MULTI-SYSTEM PHYSICAL EXAMINATION:    Constitutional: Well-nourished. No physical deformities. Normally developed. Good grooming.  Respiratory: No labored breathing, no use of accessory muscles. Normal breath sounds.  Cardiovascular: Regular rate and rhythm. No murmur, no gallop.  Skin: No paleness, no jaundice, no cyanosis. No lesion, no ulcer, no rash.  Neurologic / Psychiatric: Oriented to time, oriented to place, oriented to person. No depression, no anxiety, no agitation.  Gastrointestinal: No hernia. No mass, no tenderness, no rigidity, non obese abdomen. No CVAT.  Musculoskeletal: Normal gait and station of head and neck.     PAST DATA REVIEWED:  Source Of History:  Patient  Records Review:   Previous Patient Records  Urine Test Review:   Urinalysis  X-Ray Review: KUB: Reviewed Films. Discussed With Patient.  C.T. Pelvis: Reviewed Films. Reviewed Report.     06/26/17 06/01/16 05/31/14 02/03/14 01/23/13  PSA  Total PSA 4.73 ng/dl 3.16 ng/dl 1.18 ng/dl 0.87  ng/dl 0.82 ng/dl    07/24/17  Hormones  Testosterone, Total 591.4 ng/dL    PROCEDURES:         KUB - 74018  A single view of the abdomen is obtained.  Bony Abnormalities:  Lumbar Spinal hardware. Stable pelvic phlebolith.  Fecal Stasis:  WNL bowel gas and stool pattern  Calculi:  4.4 mm calculi at the right UVJ. A 77m RUP stone is present. A 6.216mRLP stone is present. No obvious left sided calculi are seen. The left ureter appears clear.               Urinalysis Dipstick Dipstick Cont'd  Color: Yellow Bilirubin: Neg  Appearance: Clear Ketones: Neg  Specific Gravity: 1.025 Blood: Neg  pH: 5.5 Protein: Trace  Glucose: Neg Urobilinogen: 0.2    Nitrites: Neg    Leukocyte Esterase: Neg    ASSESSMENT:      ICD-10 Details  1 GU:   Renal and ureteral calculus - N20.2    PLAN:           Orders Labs Urine Culture          Schedule Return Visit/Planned Activity: Other See  Visit Notes - Follow up MD          Document Letter(s):  Created for Patient: Clinical Summary         Notes:   Predominantly right-sided stone burden with distal ureteral calculi measuring around 4 mm just above the right UVJ. Patient without obstructing stone symptoms. Clear urinalysis. Because of pending prostate cancer treatment, I suspect his urologist would want to pursue treatment of at least the distal ureteral calculi so as to limit complications from pending prostate cancer treatment. The patient has had 2 previous stone events and no definitive stone treatment intervention. I discussed shockwave lithotripsy versus ureteroscopy.   Discussed likely treatment scenarios specifically ESWL and Ureteroscopy with plan for OV and removal of stent after confirmation from review of operative note and/or KUB stone is no longer found to be a signficant risk to cause obstruction.  We did discuss the complications of shockwave lithotripsy including need for additional procedures, hematoma, obstructing fragments, and ongoing pain. I have reviewed the risks of ureteroscopy with the patient including bleeding, infection, ureteral injury, need for a stent or secondary procedures, thrombotic events and anesthetic complications.  MET is also an option with tamsulosin but I have some concerns over the length of time the stone is actually been in the ureter creating a situation where the calculus is likely embedded, thus I withheld alpha-blocker therapy at this time.   Move forward going to send a message to his urologist regarding plan of care and advise the patient once the consult is complete.       * Signed by LaJiles Crockern 09/24/17 at 9:23 AM (EDT)*

## 2017-10-10 ENCOUNTER — Encounter (HOSPITAL_COMMUNITY): Payer: Self-pay | Admitting: General Practice

## 2017-10-10 ENCOUNTER — Ambulatory Visit (HOSPITAL_COMMUNITY)
Admission: RE | Admit: 2017-10-10 | Discharge: 2017-10-10 | Disposition: A | Discharge status: Home / Self Care | Payer: Medicare Other | Source: Other Acute Inpatient Hospital | Attending: Urology | Admitting: Urology

## 2017-10-10 ENCOUNTER — Ambulatory Visit (HOSPITAL_COMMUNITY): Payer: Medicare Other

## 2017-10-10 ENCOUNTER — Encounter (HOSPITAL_COMMUNITY)
Admission: RE | Disposition: A | Discharge status: Home / Self Care | Payer: Self-pay | Source: Other Acute Inpatient Hospital | Attending: Urology

## 2017-10-10 DIAGNOSIS — Z8546 Personal history of malignant neoplasm of prostate: Secondary | ICD-10-CM | POA: Diagnosis not present

## 2017-10-10 DIAGNOSIS — Z87891 Personal history of nicotine dependence: Secondary | ICD-10-CM | POA: Insufficient documentation

## 2017-10-10 DIAGNOSIS — Z79899 Other long term (current) drug therapy: Secondary | ICD-10-CM | POA: Insufficient documentation

## 2017-10-10 DIAGNOSIS — I10 Essential (primary) hypertension: Secondary | ICD-10-CM | POA: Insufficient documentation

## 2017-10-10 DIAGNOSIS — Z87442 Personal history of urinary calculi: Secondary | ICD-10-CM | POA: Diagnosis not present

## 2017-10-10 DIAGNOSIS — Z7982 Long term (current) use of aspirin: Secondary | ICD-10-CM | POA: Insufficient documentation

## 2017-10-10 DIAGNOSIS — N202 Calculus of kidney with calculus of ureter: Secondary | ICD-10-CM | POA: Insufficient documentation

## 2017-10-10 DIAGNOSIS — N201 Calculus of ureter: Secondary | ICD-10-CM

## 2017-10-10 HISTORY — PX: EXTRACORPOREAL SHOCK WAVE LITHOTRIPSY: SHX1557

## 2017-10-10 SURGERY — LITHOTRIPSY, ESWL
Anesthesia: LOCAL | Laterality: Right

## 2017-10-10 MED ORDER — DIAZEPAM 5 MG PO TABS
10.0000 mg | ORAL_TABLET | ORAL | Status: AC
  Administered 2017-10-10: 10 mg via ORAL
  Filled 2017-10-10: qty 2

## 2017-10-10 MED ORDER — DIPHENHYDRAMINE HCL 25 MG PO CAPS
25.0000 mg | ORAL_CAPSULE | ORAL | Status: AC
  Administered 2017-10-10: 25 mg via ORAL
  Filled 2017-10-10: qty 1

## 2017-10-10 MED ORDER — CIPROFLOXACIN HCL 500 MG PO TABS
500.0000 mg | ORAL_TABLET | ORAL | Status: AC
  Administered 2017-10-10: 500 mg via ORAL
  Filled 2017-10-10: qty 1

## 2017-10-10 MED ORDER — SODIUM CHLORIDE 0.9 % IV SOLN
INTRAVENOUS | Status: DC
  Administered 2017-10-10: 08:00:00 via INTRAVENOUS

## 2017-10-10 NOTE — Interval H&P Note (Signed)
History and Physical Interval Note:  10/10/2017 8:07 AM  Alexander Cox  has presented today for surgery, with the diagnosis of RIGHT DISTAL STONE  The various methods of treatment have been discussed with the patient and family. After consideration of risks, benefits and other options for treatment, the patient has consented to  Procedure(s): RIGHT EXTRACORPOREAL SHOCK WAVE LITHOTRIPSY (ESWL) (Right) as a surgical intervention .  The patient's history has been reviewed, patient examined, no change in status, stable for surgery.  I have reviewed the patient's chart and labs.  Questions were answered to the patient's satisfaction.     Brandon Scarbrough,LES

## 2017-10-10 NOTE — Discharge Instructions (Signed)
1. You should strain your urine and collect all fragments and bring them to your follow up appointment.  °2. You should take your pain medication as needed.  Please call if your pain is severe to the point that it is not controlled with your pain medication. °3. You should call if you develop fever > 101 or persistent nausea or vomiting. °4. Your doctor may prescribe tamsulosin to take to help facilitate stone passage. °

## 2017-10-10 NOTE — Op Note (Signed)
Please see scanned chart for ESWL operative note. 

## 2017-10-11 ENCOUNTER — Encounter (HOSPITAL_COMMUNITY): Payer: Self-pay | Admitting: Urology

## 2017-11-21 ENCOUNTER — Other Ambulatory Visit: Payer: Self-pay | Admitting: Urology

## 2017-11-21 ENCOUNTER — Encounter: Payer: Self-pay | Admitting: Urology

## 2017-11-21 DIAGNOSIS — C61 Malignant neoplasm of prostate: Secondary | ICD-10-CM

## 2017-11-21 NOTE — Progress Notes (Signed)
I have confirmed with [facility] that the patient received Firmagon 240mg  IM on 10/15/17 and Lupron injection 11/18/17.  He is scheduled for fiducials and SpaceOAR with Dr. Jeffie Pollock on 11/26/17 so we will move forward with scheduling CT SIM and MRI prostate (same day) for week of 12/02/17 in anticipation of beginning 5.5 weeks of prostate IMRT in early August 2019.  Inbox request, sent to Romie Jumper, to call patient to coordinate appointments.   Nicholos Johns, PA-C

## 2017-11-22 ENCOUNTER — Telehealth: Payer: Self-pay | Admitting: *Deleted

## 2017-11-22 NOTE — Telephone Encounter (Signed)
CALLED PATIENT TO INFORM OF FID. MARKER AND SPACE OAR PLACEMENT ON 11-26-17 IN DR. Ralene Muskrat OFFICE AND HIS SIM ON 11-29-17 - ARRIVAL TIME - 2:45 PM @ DR. MANNING'S OFFICE, SPOKE WITH PATIENT AND HE IS AWARE OF THESE APPTS.

## 2017-11-26 ENCOUNTER — Encounter (HOSPITAL_COMMUNITY): Payer: Self-pay

## 2017-11-26 NOTE — Patient Instructions (Signed)
Your procedure is scheduled on: Thursday, November 28, 2017   Surgery Time:  11:45AM-12:35PM   Report to [facility] Main  Entrance    Report to admitting at 9:45 AM   Call this number if you have problems the morning of surgery [phone]   Do not eat food or drink liquids :After Midnight.   Do NOT smoke after Midnight   Take these medicines the morning of surgery with A SIP OF WATER: Gabapentin, Rabeprazole, Simvastatin                               You may not have any metal on your body including jewelry, and body piercings             Do not wear lotions, powders, perfumes/cologne, or deodorant                      Men may shave face and neck.   Do not bring valuables to the hospital. [place].   Contacts, dentures or bridgework may not be worn into surgery.    Patients discharged the day of surgery will not be allowed to drive home.   Special Instructions: Bring a copy of your healthcare power of attorney and living will documents         the day of surgery if you haven't scanned them in before.              Please read over the following fact sheets you were given:  [facility] - Preparing for Surgery Before surgery, you can play an important role.  Because skin is not sterile, your skin needs to be as free of germs as possible.  You can reduce the number of germs on your skin by washing with CHG (chlorahexidine gluconate) soap before surgery.  CHG is an antiseptic cleaner which kills germs and bonds with the skin to continue killing germs even after washing. Please DO NOT use if you have an allergy to CHG or antibacterial soaps.  If your skin becomes reddened/irritated stop using the CHG and inform your nurse when you arrive at Short Stay. Do not shave (including legs and underarms) for at least 48 hours prior to the first CHG shower.  You may shave your face/neck.  Please follow these instructions  carefully:  1.  Shower with CHG Soap the night before surgery and the  morning of surgery.  2.  If you choose to wash your hair, wash your hair first as usual with your normal  shampoo.  3.  After you shampoo, rinse your hair and body thoroughly to remove the shampoo.                             4.  Use CHG as you would any other liquid soap.  You can apply chg directly to the skin and wash.  Gently with a scrungie or clean washcloth.  5.  Apply the CHG Soap to your body ONLY FROM THE NECK DOWN.   Do   not use on face/ open                           Wound or open sores. Avoid contact  with eyes, ears mouth and   genitals (private parts).                       Wash face,  Genitals (private parts) with your normal soap.             6.  Wash thoroughly, paying special attention to the area where your    surgery  will be performed.  7.  Thoroughly rinse your body with warm water from the neck down.  8.  DO NOT shower/wash with your normal soap after using and rinsing off the CHG Soap.                9.  Pat yourself dry with a clean towel.            10.  Wear clean pajamas.            11.  Place clean sheets on your bed the night of your first shower and do not  sleep with pets. Day of Surgery : Do not apply any lotions/deodorants the morning of surgery.  Please wear clean clothes to the hospital/surgery center.  FAILURE TO FOLLOW THESE INSTRUCTIONS MAY RESULT IN THE CANCELLATION OF YOUR SURGERY  PATIENT SIGNATURE_________________________________  NURSE SIGNATURE__________________________________  ________________________________________________________________________

## 2017-11-27 ENCOUNTER — Encounter (HOSPITAL_COMMUNITY): Payer: Self-pay

## 2017-11-27 ENCOUNTER — Other Ambulatory Visit: Payer: Self-pay

## 2017-11-27 ENCOUNTER — Encounter (HOSPITAL_COMMUNITY)
Admission: RE | Admit: 2017-11-27 | Discharge: 2017-11-27 | Disposition: A | Discharge status: Home / Self Care | Payer: Medicare Other | Source: Ambulatory Visit | Attending: Urology | Admitting: Urology

## 2017-11-27 DIAGNOSIS — Z79899 Other long term (current) drug therapy: Secondary | ICD-10-CM | POA: Diagnosis not present

## 2017-11-27 DIAGNOSIS — N201 Calculus of ureter: Secondary | ICD-10-CM | POA: Diagnosis not present

## 2017-11-27 DIAGNOSIS — C61 Malignant neoplasm of prostate: Secondary | ICD-10-CM | POA: Diagnosis not present

## 2017-11-27 DIAGNOSIS — K219 Gastro-esophageal reflux disease without esophagitis: Secondary | ICD-10-CM | POA: Diagnosis not present

## 2017-11-27 DIAGNOSIS — E7800 Pure hypercholesterolemia, unspecified: Secondary | ICD-10-CM | POA: Diagnosis not present

## 2017-11-27 DIAGNOSIS — I10 Essential (primary) hypertension: Secondary | ICD-10-CM | POA: Diagnosis not present

## 2017-11-27 DIAGNOSIS — Z7982 Long term (current) use of aspirin: Secondary | ICD-10-CM | POA: Diagnosis not present

## 2017-11-27 DIAGNOSIS — Z87891 Personal history of nicotine dependence: Secondary | ICD-10-CM | POA: Diagnosis not present

## 2017-11-27 HISTORY — DX: Myoneural disorder, unspecified: G70.9

## 2017-11-27 HISTORY — DX: Other intervertebral disc degeneration, lumbar region without mention of lumbar back pain or lower extremity pain: M51.369

## 2017-11-27 HISTORY — DX: Other intervertebral disc degeneration, lumbar region: M51.36

## 2017-11-27 HISTORY — DX: Other ill-defined heart diseases: I51.89

## 2017-11-27 HISTORY — DX: Gastro-esophageal reflux disease without esophagitis: K21.9

## 2017-11-27 HISTORY — DX: Elevated prostate specific antigen (PSA): R97.20

## 2017-11-27 HISTORY — DX: Heart disease, unspecified: I51.9

## 2017-11-27 HISTORY — DX: Unspecified injury of lower back, initial encounter: S39.92XA

## 2017-11-27 HISTORY — DX: Pulmonary hypertension, unspecified: I27.20

## 2017-11-27 HISTORY — DX: Presence of external hearing-aid: Z97.4

## 2017-11-27 LAB — BASIC METABOLIC PANEL
Anion gap: 8 (ref 5–15)
BUN: 22 mg/dL (ref 8–23)
CALCIUM: 9.7 mg/dL (ref 8.9–10.3)
CHLORIDE: 102 mmol/L (ref 98–111)
CO2: 30 mmol/L (ref 22–32)
CREATININE: 1.68 mg/dL — AB (ref 0.61–1.24)
GFR calc non Af Amer: 39 mL/min — ABNORMAL LOW (ref >60)
GFR, EST AFRICAN AMERICAN: 46 mL/min — AB (ref >60)
GLUCOSE: 98 mg/dL (ref 70–99)
Potassium: 4.3 mmol/L (ref 3.5–5.1)
Sodium: 140 mmol/L (ref 135–145)

## 2017-11-27 LAB — CBC
HCT: 44.6 % (ref 39.0–52.0)
Hemoglobin: 15.1 g/dL (ref 13.0–17.0)
MCH: 30.4 pg (ref 26.0–34.0)
MCHC: 33.9 g/dL (ref 30.0–36.0)
MCV: 89.7 fL (ref 78.0–100.0)
Platelets: 254 10*3/uL (ref 150–400)
RBC: 4.97 MIL/uL (ref 4.22–5.81)
RDW: 12.3 % (ref 11.5–15.5)
WBC: 6.4 10*3/uL (ref 4.0–10.5)

## 2017-11-27 NOTE — H&P (Signed)
CC: I have a history of kidney stones.  HPI: Alexander Cox is a 72 year-old male established patient who is here for F/U due to a history of renal calculi.  The patient's stone was on his right side. He did not pass a stone since the last office visit.   The patient has not had any flank pain since they were last seen. He has no seen blood in his urine since the last visit. He is not currently having flank pain, back pain, groin pain, nausea, vomiting, fever or chills. He has never had surgical treatment for calculi in the past.   Alexander Cox returns today in f/u for his right distal stone that was treated with ESWL on 5/30. he has had no fragments pass but has no pain or voiding complaints. On KUB today the area of concern is partial obscured by gas but it appears the stone is unchanged.     CC: I have prostate cancer.  HPI:   Alexander Cox returns today for a Lupron injection. He had Firmagon 240mg  on 10/15/17 and his PSA has declined to 0.51 from 4.41 and his testosterone is <10. He has T2a N0 M0 Gleason 7(4+3) cancer with 9 total cores positive with Gleason 7(4+3) in 70% of the 2 cores at the right base. There remaining cores had 7(3+4) and 6 disease. He is scheduled for SpaceOAR and gold seeds next week.      CC: AUA Questions Scoring.  HPI:     AUA Symptom Score: He never has the sensation of not emptying his bladder completely after finishing urinating. Less than 50% of the time and 50% of the time he has to urinate again fewer than two hours after he has finished urinating. Less than 20% of the time and 50% of the time he has to start and stop again several times when he urinates. He never finds it difficult to postpone urination. 50% of the time and more than 50% of the time he has a weak urinary stream. Less than 20% of the time and Less than 50% of the time he has to push or strain to begin urination. He has to get up to urinate 1 time and 2 times from the time he goes to bed until the time  he gets up in the morning.   Calculated AUA Symptom Score: 8    ALLERGIES: None   MEDICATIONS: Hydrochlorothiazide 25 mg tablet  Lisinopril 20 mg tablet  Simvastatin 20 mg tablet  Tamsulosin Hcl 0.4 mg capsule TAKE 1 CAPSULE AT BEDTIME  Aspirin Ec 81 mg tablet, delayed release  Calcium  Fish Oil  Gabapentin 300 mg capsule  Multivitamin  Rabeprazole Sodium 20 mg tablet, delayed release  Vitamin D3     GU PSH: ESWL - 10/10/2017 Locm 300-399Mg /Ml Iodine,1Ml - 09/02/2017 Prostate Needle Biopsy - 08/14/2017    NON-GU PSH: Back surgery Hernia Repair Surgical Pathology, Gross And Microscopic Examination For Prostate Needle - 08/14/2017    GU PMH: Renal calculus - 10/24/2017 Renal and ureteral calculus - 09/24/2017 ED due to arterial insufficiency - 09/11/2017 Prostate Cancer, T2a N0 M0 Gleason 7(4+3) high volume prostate cancer in a 9ml gland. He has intermediate to high risk disease. He is not a good candidate for AS. He has moderate LUTS and severe ED. I reviewed the treatment options as noted below and will have him see Dr. Tammi Klippel for consideration of radiation and ADT which he will need for 18 months. At his age of 67 and  his high risk of locally advanced disease, I think this is his best option. I did mention the PROGRESS trial of ADT with RP but he is most interested in radiation. - 09/11/2017 Ureteral calculus, I failed to mention the stone during our visit and will call him to discuss that further. - 09/11/2017 Elevated PSA - 08/14/2017, He has a rapidly rising PSA with right apical induration and a family history of prostate cancer. I will get him set up for a biopsy and reviewed the risks of bleeding, infection and voiding difficulty. Levaquin sent. , - 07/24/2017 Prostate nodule w/ LUTS, He has mild-moderate LUTs. 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 I will check a testosterone since I have found low testosterone to be associated  with more aggressive prostate cancer. - 07/24/2017 Weak Urinary Stream - 07/24/2017    NON-GU PMH: Asthma GERD Hypercholesterolemia Hypertension    FAMILY HISTORY: Alzheimer's Disease - Father Hypertension - Mother Prostate Cancer - Father   SOCIAL HISTORY: Marital Status: Married Preferred Language: English; Race: White Current Smoking Status: Patient does not smoke anymore.   Tobacco Use Assessment Completed: Used Tobacco in last 30 days? Drinks 1 caffeinated drink per day.     Notes: 2 sons    REVIEW OF SYSTEMS:    GU Review Male:   Patient denies frequent urination, hard to postpone urination, burning/ pain with urination, get up at night to urinate, leakage of urine, stream starts and stops, trouble starting your stream, have to strain to urinate , erection problems, and penile pain.  Gastrointestinal (Upper):   Patient denies nausea, vomiting, and indigestion/ heartburn.  Gastrointestinal (Lower):   Patient denies diarrhea and constipation.  Constitutional:   Patient reports night sweats. Patient denies fever, weight loss, and fatigue.  Skin:   Patient denies skin rash/ lesion and itching.  Eyes:   Patient denies blurred vision and double vision.  Ears/ Nose/ Throat:   Patient denies sore throat and sinus problems.  Hematologic/Lymphatic:   Patient reports easy bruising. Patient denies swollen glands.  Cardiovascular:   Patient denies leg swelling and chest pains.  Respiratory:   Patient denies cough and shortness of breath.  Endocrine:   Patient denies excessive thirst.  Musculoskeletal:   Patient denies back pain and joint pain.  Neurological:   Patient denies headaches and dizziness.  Psychologic:   Patient denies depression and anxiety.   VITAL SIGNS:      11/18/2017 02:04 PM  Weight 170 lb / 77.11 kg  Height 66 in / 167.64 cm  BP 114/65 mmHg  Heart Rate 72 /min  Temperature 97.0 F / 36.1 C  BMI 27.4 kg/m   MULTI-SYSTEM PHYSICAL EXAMINATION:     Constitutional: Well-nourished. No physical deformities. Normally developed. Good grooming.  Respiratory: No labored breathing, no use of accessory muscles. Normal breath sounds.  Cardiovascular: Regular rate and rhythm. No murmur, no gallop.      PAST DATA REVIEWED:  Source Of History:  Patient  Lab Test Review:   PSA, Total Testosterone  Records Review:   AUA Symptom Score  Urine Test Review:   Urinalysis  X-Ray Review: KUB: Reviewed Films. Discussed With Patient.     11/12/17 06/26/17 06/01/16 05/31/14 02/03/14 01/23/13  PSA  Total PSA 0.51 ng/mL 4.73 ng/dl 3.16 ng/dl 1.18 ng/dl 0.87 ng/dl 0.82 ng/dl    11/12/17 07/24/17  Hormones  Testosterone, Total <10 ng/dL 591.4 ng/dL    PROCEDURES:  KUB - [id number]  A single view of the abdomen is obtained.               Urinalysis - 81003 Dipstick Dipstick Cont'd  Color: Yellow Bilirubin: Neg  Appearance: Clear Ketones: Neg  Specific Gravity: 1.025 Blood: Neg  pH: 5.5 Protein: Neg  Glucose: Neg Urobilinogen: 0.2    Nitrites: Neg    Leukocyte Esterase: Neg         Lupron Depot 45 mg / 6 Month - 96402, Q9450 The hip was sterilely prepped with alcohol. Lupron was injected intramuscularly (IM) using standard technique. The patient tolerated the procedure well. A band aid was applied. The site was dry when the patient left the exam room. The patient will return as scheduled for his next Lupron injection.   Qty: 45 Adm. By: Verlee Monte  Unit: mg Lot No [id number]  Route: IM Exp. Date 04/05/2020  Freq: None Mfgr.:   Site: Right Buttock   ASSESSMENT:      ICD-10 Details  1 GU:   Prostate Cancer - C61 Improving - He is doing well on ADT and was given Lupron today. He is to have SpaceOAR and fiducials on 7/16.   2   Ureteral calculus - N20.1 The stone at the right UVJ is minimally changed post ESWL. He is to start XRT in the next few weeks and I think we need to get it out before then. I am going to set him up for next week for  ureteroscopy. I have reviewed the risks of ureteroscopy including bleeding, infection, ureteral injury, need for a stent or secondary procedures, thrombotic events and anesthetic complications.    PLAN:           Schedule Return Visit/Planned Activity: Next Available Appointment - Schedule Surgery             Note: He needs Ureteroscopy   Procedure: 11/18/2017 at [facility] - [phone] - Lupron Depot 45 mg / 6 Month (Depolupron 1 3 4  Month) - P9150, 56979          Document Letter(s):  Created for Patient: Clinical Summary

## 2017-11-27 NOTE — Pre-Procedure Instructions (Signed)
BMP results 11/27/2017 faxed to Dr. Jeffie Pollock via epic.

## 2017-11-28 ENCOUNTER — Telehealth: Payer: Self-pay | Admitting: *Deleted

## 2017-11-28 ENCOUNTER — Encounter (HOSPITAL_COMMUNITY)
Admission: RE | Disposition: A | Discharge status: Home / Self Care | Payer: Self-pay | Source: Ambulatory Visit | Attending: Urology

## 2017-11-28 ENCOUNTER — Ambulatory Visit (HOSPITAL_COMMUNITY): Payer: Medicare Other

## 2017-11-28 ENCOUNTER — Encounter (HOSPITAL_COMMUNITY): Payer: Self-pay

## 2017-11-28 ENCOUNTER — Ambulatory Visit (HOSPITAL_COMMUNITY): Payer: Medicare Other | Admitting: Certified Registered Nurse Anesthetist

## 2017-11-28 ENCOUNTER — Ambulatory Visit (HOSPITAL_COMMUNITY)
Admission: RE | Admit: 2017-11-28 | Discharge: 2017-11-28 | Disposition: A | Discharge status: Home / Self Care | Payer: Medicare Other | Source: Ambulatory Visit | Attending: Urology | Admitting: Urology

## 2017-11-28 DIAGNOSIS — I10 Essential (primary) hypertension: Secondary | ICD-10-CM | POA: Insufficient documentation

## 2017-11-28 DIAGNOSIS — E7800 Pure hypercholesterolemia, unspecified: Secondary | ICD-10-CM | POA: Insufficient documentation

## 2017-11-28 DIAGNOSIS — Z79899 Other long term (current) drug therapy: Secondary | ICD-10-CM | POA: Diagnosis not present

## 2017-11-28 DIAGNOSIS — K219 Gastro-esophageal reflux disease without esophagitis: Secondary | ICD-10-CM | POA: Insufficient documentation

## 2017-11-28 DIAGNOSIS — Z7982 Long term (current) use of aspirin: Secondary | ICD-10-CM | POA: Insufficient documentation

## 2017-11-28 DIAGNOSIS — C61 Malignant neoplasm of prostate: Secondary | ICD-10-CM | POA: Diagnosis not present

## 2017-11-28 DIAGNOSIS — N201 Calculus of ureter: Secondary | ICD-10-CM | POA: Diagnosis not present

## 2017-11-28 DIAGNOSIS — Z87891 Personal history of nicotine dependence: Secondary | ICD-10-CM | POA: Insufficient documentation

## 2017-11-28 HISTORY — PX: CYSTOSCOPY/RETROGRADE/URETEROSCOPY/STONE EXTRACTION WITH BASKET: SHX5317

## 2017-11-28 SURGERY — CYSTOSCOPY, WITH CALCULUS REMOVAL USING BASKET
Anesthesia: General | Laterality: Right

## 2017-11-28 MED ORDER — LIDOCAINE 2% (20 MG/ML) 5 ML SYRINGE
INTRAMUSCULAR | Status: AC
  Filled 2017-11-28: qty 5

## 2017-11-28 MED ORDER — CEFAZOLIN SODIUM-DEXTROSE 2-4 GM/100ML-% IV SOLN
2.0000 g | INTRAVENOUS | Status: AC
  Administered 2017-11-28: 2 g via INTRAVENOUS

## 2017-11-28 MED ORDER — LIDOCAINE 2% (20 MG/ML) 5 ML SYRINGE
INTRAMUSCULAR | Status: DC | PRN
  Administered 2017-11-28: 100 mg via INTRAVENOUS

## 2017-11-28 MED ORDER — LACTATED RINGERS IV SOLN
INTRAVENOUS | Status: DC
  Administered 2017-11-28: 1000 mL via INTRAVENOUS

## 2017-11-28 MED ORDER — ONDANSETRON HCL 4 MG/2ML IJ SOLN
INTRAMUSCULAR | Status: AC
  Filled 2017-11-28: qty 2

## 2017-11-28 MED ORDER — PROPOFOL 10 MG/ML IV BOLUS
INTRAVENOUS | Status: AC
  Filled 2017-11-28: qty 20

## 2017-11-28 MED ORDER — EPHEDRINE SULFATE-NACL 50-0.9 MG/10ML-% IV SOSY
PREFILLED_SYRINGE | INTRAVENOUS | Status: DC | PRN
  Administered 2017-11-28 (×2): 5 mg via INTRAVENOUS

## 2017-11-28 MED ORDER — FENTANYL CITRATE (PF) 100 MCG/2ML IJ SOLN: 25.0000 ug | INTRAMUSCULAR | Status: DC | PRN

## 2017-11-28 MED ORDER — MIDAZOLAM HCL 2 MG/2ML IJ SOLN: 0.5000 mg | Freq: Once | INTRAMUSCULAR | Status: DC | PRN

## 2017-11-28 MED ORDER — DEXAMETHASONE SODIUM PHOSPHATE 10 MG/ML IJ SOLN
INTRAMUSCULAR | Status: AC
  Filled 2017-11-28: qty 1

## 2017-11-28 MED ORDER — MEPERIDINE HCL 50 MG/ML IJ SOLN: 6.2500 mg | INTRAMUSCULAR | Status: DC | PRN

## 2017-11-28 MED ORDER — IOHEXOL 300 MG/ML  SOLN
INTRAMUSCULAR | Status: DC | PRN
  Administered 2017-11-28: 1 mL via INTRAVENOUS

## 2017-11-28 MED ORDER — FENTANYL CITRATE (PF) 100 MCG/2ML IJ SOLN
INTRAMUSCULAR | Status: DC | PRN
  Administered 2017-11-28: 50 ug via INTRAVENOUS

## 2017-11-28 MED ORDER — DEXAMETHASONE SODIUM PHOSPHATE 10 MG/ML IJ SOLN
INTRAMUSCULAR | Status: DC | PRN
  Administered 2017-11-28: 10 mg via INTRAVENOUS

## 2017-11-28 MED ORDER — SODIUM CHLORIDE 0.9 % IR SOLN
Status: DC | PRN
  Administered 2017-11-28: 1 mL

## 2017-11-28 MED ORDER — HYDROCODONE-ACETAMINOPHEN 5-325 MG PO TABS: 1.0000 | ORAL_TABLET | ORAL | 0 refills | Status: AC | PRN

## 2017-11-28 MED ORDER — ONDANSETRON HCL 4 MG/2ML IJ SOLN
INTRAMUSCULAR | Status: DC | PRN
  Administered 2017-11-28: 4 mg via INTRAVENOUS

## 2017-11-28 MED ORDER — CEFAZOLIN SODIUM-DEXTROSE 2-4 GM/100ML-% IV SOLN
INTRAVENOUS | Status: AC
  Filled 2017-11-28: qty 100

## 2017-11-28 MED ORDER — PROPOFOL 10 MG/ML IV BOLUS
INTRAVENOUS | Status: DC | PRN
  Administered 2017-11-28: 140 mg via INTRAVENOUS

## 2017-11-28 MED ORDER — PROMETHAZINE HCL 25 MG/ML IJ SOLN: 6.2500 mg | INTRAMUSCULAR | Status: DC | PRN

## 2017-11-28 MED ORDER — FENTANYL CITRATE (PF) 100 MCG/2ML IJ SOLN
INTRAMUSCULAR | Status: AC
  Filled 2017-11-28: qty 2

## 2017-11-28 SURGICAL SUPPLY — 23 items
BAG URO CATCHER STRL LF (MISCELLANEOUS) ×3 IMPLANT
BASKET STONE NCOMPASS (UROLOGICAL SUPPLIES) IMPLANT
CATH URET 5FR 28IN OPEN ENDED (CATHETERS) IMPLANT
CATH URET DUAL LUMEN 6-10FR 50 (CATHETERS) ×1 IMPLANT
CLOTH BEACON ORANGE TIMEOUT ST (SAFETY) ×3 IMPLANT
COVER FOOTSWITCH UNIV (MISCELLANEOUS) IMPLANT
COVER SURGICAL LIGHT HANDLE (MISCELLANEOUS) ×1 IMPLANT
EXTRACTOR STONE NITINOL NGAGE (UROLOGICAL SUPPLIES) ×3 IMPLANT
FIBER LASER FLEXIVA 1000 (UROLOGICAL SUPPLIES) IMPLANT
FIBER LASER FLEXIVA 365 (UROLOGICAL SUPPLIES) IMPLANT
FIBER LASER FLEXIVA 550 (UROLOGICAL SUPPLIES) IMPLANT
FIBER LASER TRAC TIP (UROLOGICAL SUPPLIES) IMPLANT
GOWN STRL REUS W/TWL XL LVL3 (GOWN DISPOSABLE) ×3 IMPLANT
GUIDEWIRE STR DUAL SENSOR (WIRE) ×3 IMPLANT
IV NS 1000ML (IV SOLUTION)
IV NS 1000ML BAXH (IV SOLUTION) ×1 IMPLANT
IV NS IRRIG 3000ML ARTHROMATIC (IV SOLUTION) ×1 IMPLANT
MANIFOLD NEPTUNE II (INSTRUMENTS) ×3 IMPLANT
PACK CYSTO (CUSTOM PROCEDURE TRAY) ×3 IMPLANT
SHEATH URETERAL 12FRX35CM (MISCELLANEOUS) ×1 IMPLANT
TUBING CONNECTING 10 (TUBING) ×2 IMPLANT
TUBING CONNECTING 10' (TUBING) ×1
TUBING UROLOGY SET (TUBING) ×3 IMPLANT

## 2017-11-28 NOTE — Interval H&P Note (Signed)
History and Physical Interval Note:  11/28/2017 11:26 AM  Kathalene Frames  has presented today for surgery, with the diagnosis of RIGHT URETEROVESICALJUNCTION STONE  The various methods of treatment have been discussed with the patient and family. After consideration of risks, benefits and other options for treatment, the patient has consented to  Procedure(s): RIGHT URETEROSCOPY/STONE EXTRACTION WITH BASKET (Right) as a surgical intervention .  The patient's history has been reviewed, patient examined, no change in status, stable for surgery.  I have reviewed the patient's chart and labs.  Questions were answered to the patient's satisfaction.     Irine Seal

## 2017-11-28 NOTE — Anesthesia Postprocedure Evaluation (Signed)
Anesthesia Post Note  Patient: Alexander Cox  Procedure(s) Performed: RIGHT URETEROSCOPY/STONE EXTRACTION WITH BASKET (Right )     Patient location during evaluation: PACU Anesthesia Type: General Level of consciousness: awake and alert, oriented and patient cooperative Pain management: pain level controlled Vital Signs Assessment: post-procedure vital signs reviewed and stable Respiratory status: spontaneous breathing, nonlabored ventilation and respiratory function stable Cardiovascular status: blood pressure returned to baseline and stable Postop Assessment: no apparent nausea or vomiting Anesthetic complications: no    Last Vitals:  Vitals:   11/28/17 1245 11/28/17 1325  BP: (!) 148/71 (!) 162/74  Pulse: 60 (!) 55  Resp: 12 12  Temp: (!) 36.3 C 36.4 C  SpO2: 100% 100%    Last Pain:  Vitals:   11/28/17 1325  TempSrc: Oral  PainSc: 0-No pain                 Donyea Gafford,E. Patty Lopezgarcia

## 2017-11-28 NOTE — Anesthesia Preprocedure Evaluation (Addendum)
Anesthesia Evaluation  Patient identified by MRN, date of birth, ID band Patient awake    Reviewed: Allergy & Precautions, NPO status , Patient's Chart, lab work & pertinent test results  History of Anesthesia Complications Negative for: history of anesthetic complications  Airway Mallampati: II  TM Distance: >3 FB Neck ROM: Full    Dental  (+) Chipped, Dental Advisory Given   Pulmonary former smoker (quit 1969),    breath sounds clear to auscultation       Cardiovascular hypertension, Pt. on medications (-) angina Rhythm:Regular Rate:Normal  '15 ECHO: EF 50-55%, mild TR, mod pulm HTN   Neuro/Psych negative neurological ROS     GI/Hepatic Neg liver ROS, GERD  Medicated and Controlled,celiac   Endo/Other  negative endocrine ROS  Renal/GU Renal InsufficiencyRenal disease (creat 1.68)stones   Prostate cancer    Musculoskeletal  (+) Arthritis ,   Abdominal   Peds  Hematology negative hematology ROS (+)   Anesthesia Other Findings   Reproductive/Obstetrics                            Anesthesia Physical Anesthesia Plan  ASA: II  Anesthesia Plan: General   Post-op Pain Management:    Induction: Intravenous  PONV Risk Score and Plan: 2 and Ondansetron and Dexamethasone  Airway Management Planned: LMA  Additional Equipment:   Intra-op Plan:   Post-operative Plan:   Informed Consent: I have reviewed the patients History and Physical, chart, labs and discussed the procedure including the risks, benefits and alternatives for the proposed anesthesia with the patient or authorized representative who has indicated his/her understanding and acceptance.   Dental advisory given  Plan Discussed with: CRNA and Surgeon  Anesthesia Plan Comments: (Plan routine monitors, GA- LMA OK)        Anesthesia Quick Evaluation

## 2017-11-28 NOTE — Telephone Encounter (Signed)
CALLED PATIENT TO REMIND OF SIM AND MRI APPT., LVM FOR A RETURN CALL

## 2017-11-28 NOTE — Anesthesia Procedure Notes (Signed)
Procedure Name: LMA Insertion Date/Time: 11/28/2017 11:58 AM Performed by: Montel Clock, CRNA Pre-anesthesia Checklist: Patient identified, Emergency Drugs available, Suction available, Patient being monitored and Timeout performed Patient Re-evaluated:Patient Re-evaluated prior to induction Oxygen Delivery Method: Circle system utilized Preoxygenation: Pre-oxygenation with 100% oxygen Induction Type: IV induction LMA: LMA with gastric port inserted LMA Size: 4.0 Number of attempts: 1 Dental Injury: Teeth and Oropharynx as per pre-operative assessment

## 2017-11-28 NOTE — Discharge Instructions (Addendum)
General Anesthesia, Adult, Care After These instructions provide you with information about caring for yourself after your procedure. Your health care provider may also give you more specific instructions. Your treatment has been planned according to current medical practices, but problems sometimes occur. Call your health care provider if you have any problems or questions after your procedure. What can I expect after the procedure? After the procedure, it is common to have:  Vomiting.  A sore throat.  Mental slowness.  It is common to feel:  Nauseous.  Cold or shivery.  Sleepy.  Tired.  Sore or achy, even in parts of your body where you did not have surgery.  Follow these instructions at home: For at least 24 hours after the procedure:  Do not: ? Participate in activities where you could fall or become injured. ? Drive. ? Use heavy machinery. ? Drink alcohol. ? Take sleeping pills or medicines that cause drowsiness. ? Make important decisions or sign legal documents. ? Take care of children on your own.  Rest. Eating and drinking  If you vomit, drink water, juice, or soup when you can drink without vomiting.  Drink enough fluid to keep your urine clear or pale yellow.  Make sure you have little or no nausea before eating solid foods.  Follow the diet recommended by your health care provider. General instructions  Have a responsible adult stay with you until you are awake and alert.  Return to your normal activities as told by your health care provider. Ask your health care provider what activities are safe for you.  Take over-the-counter and prescription medicines only as told by your health care provider.  If you smoke, do not smoke without supervision.  Keep all follow-up visits as told by your health care provider. This is important. Contact a health care provider if:  You continue to have nausea or vomiting at home, and medicines are not helpful.  You  cannot drink fluids or start eating again.  You cannot urinate after 8-12 hours.  You develop a skin rash.  You have fever.  You have increasing redness at the site of your procedure. Get help right away if:  You have difficulty breathing.  You have chest pain.  You have unexpected bleeding.  You feel that you are having a life-threatening or urgent problem. This information is not intended to replace advice given to you by your health care provider. Make sure you discuss any questions you have with your health care provider. Document Released: 08/06/2000 Document Revised: 10/03/2015 Document Reviewed: 04/14/2015 Elsevier Interactive Patient Education  2018 [place] CARE INSTRUCTIONS  Activity: Rest for the remainder of the day.  Do not drive or operate equipment today.  You may resume normal activities in one to two days as instructed by your physician.   Meals: Drink plenty of liquids and eat light foods such as gelatin or soup this evening.  You may return to a normal meal plan tomorrow.  Return to Work: You may return to work in one to two days or as instructed by your physician.  Special Instructions / Symptoms: Call your physician if any of these symptoms occur:   -persistent or heavy bleeding  -bleeding which continues after first few urination  -large blood clots that are difficult to pass  -urine stream diminishes or stops completely  -fever equal to or higher than 101 degrees Farenheit.  -cloudy urine with a strong, foul odor  -severe pain  Females  should always wipe from front to back after elimination.  You may feel some burning pain when you urinate.  This should disappear with time.  Applying moist heat to the lower abdomen or a hot tub bath may help relieve the pain. \    Patient Signature:  ________________________________________________________  Nurse's Signature:  ________________________________________________________

## 2017-11-28 NOTE — OR Nursing (Signed)
Stone taken by Dr. Wrenn 

## 2017-11-28 NOTE — Op Note (Signed)
Procedure: Right ureteroscopic stone extraction.  Preop diagnosis: Right UVJ stone.  Postop diagnosis: Same.  Surgeon: Dr. Irine Seal.  Anesthesia: General.  Specimen: Stone.  Drain: None.  EBL: None.  Complications: None.  Indications: Alexander Cox is a 72 year old white male with a 5 mm right UVJ stone that resisted lithotripsy and is failed to pass.  He is to undergo ureteroscopy for removal.  Procedure:He was placed in the lithotomy position and fitted with PAS hose. He was given 2 g of Ancef and taken to the operating room where a general anesthetic was induced.  His perineum and genitalia were prepped with Betadine solution.  Cystoscopy was performed using a 23 [place] scope and 30 degree lens.  Examination revealed a normal urethra.  The external sphincter was intact.  The prostatic urethra was 2 to 3 cm in length with some lateral lobe enlargement with mild obstruction.  Examination of bladder revealed mild trabeculation, no tumors or stones were noted and ureteral orifices were in the normal anatomic position.  The stone was visible just inside the right ureteral orifice.  The cystoscope was removed and the 6.5 French dual-lumen semirigid ureteroscope was then passed per urethra.  Using the engage basket is a filiform the ureteroscope was advanced into the right distal ureter.  The stone was then retrieved with the basket.  Once the stone is been removed repeat ureteroscopy revealed no residual stone material in the remaining distal ureter and the ureteral meatus was intact with minimal trauma.  It was not felt that the stent was indicated.  Ureteroscope was removed and the bladder was then drained with a cystoscope which was then removed.  Patient was taken down from lithotomy position, his anesthetic was reversed and he was moved to recovery room in stable condition.  There were no complications.

## 2017-11-28 NOTE — Transfer of Care (Signed)
Immediate Anesthesia Transfer of Care Note  Patient: Alexander Cox  Procedure(s) Performed: RIGHT URETEROSCOPY/STONE EXTRACTION WITH BASKET (Right )  Patient Location: PACU  Anesthesia Type:General  Level of Consciousness: drowsy  Airway & Oxygen Therapy: Patient Spontanous Breathing and Patient connected to face mask oxygen  Post-op Assessment: Report given to RN and Post -op Vital signs reviewed and stable  Post vital signs: Reviewed and stable  Last Vitals:  Vitals Value Taken Time  BP 130/64 11/28/2017 12:26 PM  Temp    Pulse 54 11/28/2017 12:30 PM  Resp 14 11/28/2017 12:30 PM  SpO2 100 % 11/28/2017 12:30 PM  Vitals shown include unvalidated device data.  Last Pain:  Vitals:   11/28/17 1014  TempSrc:   PainSc: 0-No pain      Patients Stated Pain Goal: 4 (96/04/54 0981)  Complications: No apparent anesthesia complications

## 2017-11-29 ENCOUNTER — Encounter: Payer: Self-pay | Admitting: Medical Oncology

## 2017-11-29 ENCOUNTER — Ambulatory Visit
Admission: RE | Admit: 2017-11-29 | Discharge: 2017-11-29 | Disposition: A | Discharge status: Home / Self Care | Payer: Medicare Other | Source: Ambulatory Visit | Attending: Radiation Oncology | Admitting: Radiation Oncology

## 2017-11-29 DIAGNOSIS — Z510 Encounter for antineoplastic radiation therapy: Secondary | ICD-10-CM | POA: Diagnosis not present

## 2017-11-29 DIAGNOSIS — C61 Malignant neoplasm of prostate: Secondary | ICD-10-CM | POA: Diagnosis present

## 2017-11-29 NOTE — Progress Notes (Signed)
  Radiation Oncology         [phone]) [phone] ________________________________  Name: Alexander Cox MRN: [id number]  Date: 11/29/2017  DOB: 08/22/1945  SIMULATION AND TREATMENT PLANNING NOTE    ICD-10-CM   1. Malignant neoplasm of prostate ([place]) C61    DIAGNOSIS:   72 y.o. 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  Identity was confirmed.  All relevant records and images related to the planned course of therapy were reviewed.  The patient freely provided informed written consent to proceed with treatment after reviewing the details related to the planned course of therapy. The consent form was witnessed and verified by the simulation staff.  Then, the patient was set-up in a stable reproducible supine position for radiation therapy.  A vacuum lock pillow device was custom fabricated to position his legs in a reproducible immobilized position.  Then, I performed a urethrogram under sterile conditions to identify the prostatic apex.  CT images were obtained.  Surface markings were placed.  The CT images were loaded into the planning software.  Then the prostate target and avoidance structures including the rectum, bladder, bowel and hips were contoured.  Treatment planning then occurred.  The radiation prescription was entered and confirmed.  A total of one complex treatment devices was fabricated. I have requested : Intensity Modulated Radiotherapy (IMRT) is medically necessary for this case for the following reason:  Rectal sparing.Marland Kitchen  PLAN:  The patient will receive 70 Gy in 28 fractions.  ________________________________  Sheral Apley Tammi Klippel, M.D. This document serves as a record of services personally performed by Tyler Pita, MD. It was created on his behalf by Vanessa Ralphs, a trained medical scribe. The creation of this record is based on the scribe's  personal observations and the provider's statements to them. This document has been checked and approved by the attending provider.

## 2017-11-30 ENCOUNTER — Ambulatory Visit (HOSPITAL_COMMUNITY)
Admission: RE | Admit: 2017-11-30 | Discharge: 2017-11-30 | Disposition: A | Discharge status: Home / Self Care | Payer: Medicare Other | Source: Ambulatory Visit | Attending: Urology | Admitting: Urology

## 2017-11-30 DIAGNOSIS — C61 Malignant neoplasm of prostate: Secondary | ICD-10-CM | POA: Diagnosis present

## 2017-12-03 DIAGNOSIS — C61 Malignant neoplasm of prostate: Secondary | ICD-10-CM | POA: Diagnosis not present

## 2017-12-10 ENCOUNTER — Ambulatory Visit
Admission: RE | Admit: 2017-12-10 | Discharge: 2017-12-10 | Disposition: A | Discharge status: Home / Self Care | Payer: Medicare Other | Source: Ambulatory Visit | Attending: Radiation Oncology | Admitting: Radiation Oncology

## 2017-12-10 ENCOUNTER — Encounter: Payer: Self-pay | Admitting: Medical Oncology

## 2017-12-10 DIAGNOSIS — C61 Malignant neoplasm of prostate: Secondary | ICD-10-CM | POA: Diagnosis not present

## 2017-12-11 ENCOUNTER — Ambulatory Visit
Admission: RE | Admit: 2017-12-11 | Discharge: 2017-12-11 | Disposition: A | Discharge status: Home / Self Care | Payer: Medicare Other | Source: Ambulatory Visit | Attending: Radiation Oncology | Admitting: Radiation Oncology

## 2017-12-11 DIAGNOSIS — C61 Malignant neoplasm of prostate: Secondary | ICD-10-CM | POA: Diagnosis not present

## 2017-12-12 ENCOUNTER — Ambulatory Visit
Admission: RE | Admit: 2017-12-12 | Discharge: 2017-12-12 | Disposition: A | Discharge status: Home / Self Care | Payer: Medicare Other | Source: Ambulatory Visit | Attending: Radiation Oncology | Admitting: Radiation Oncology

## 2017-12-12 DIAGNOSIS — C61 Malignant neoplasm of prostate: Secondary | ICD-10-CM | POA: Insufficient documentation

## 2017-12-13 ENCOUNTER — Ambulatory Visit
Admission: RE | Admit: 2017-12-13 | Discharge: 2017-12-13 | Disposition: A | Discharge status: Home / Self Care | Payer: Medicare Other | Source: Ambulatory Visit | Attending: Radiation Oncology | Admitting: Radiation Oncology

## 2017-12-13 DIAGNOSIS — C61 Malignant neoplasm of prostate: Secondary | ICD-10-CM | POA: Diagnosis not present

## 2017-12-13 NOTE — Progress Notes (Signed)
Pt here for patient teaching.  Pt given Radiation and You booklet.  Reviewed areas of pertinence such as diarrhea, fatigue and urinary and bladder changes . Pt able to give teach back of have Imodium on hand and drink plenty of water,. Pt demonstrated understanding, needs reinforcement, no evidence of learning, refused teaching and  of information given and will contact nursing with any questions or concerns.     [email or web address]/treatmentinformation/whattoexpect/index      

## 2017-12-16 ENCOUNTER — Ambulatory Visit
Admission: RE | Admit: 2017-12-16 | Discharge: 2017-12-16 | Disposition: A | Discharge status: Home / Self Care | Payer: Medicare Other | Source: Ambulatory Visit | Attending: Radiation Oncology | Admitting: Radiation Oncology

## 2017-12-16 DIAGNOSIS — C61 Malignant neoplasm of prostate: Secondary | ICD-10-CM | POA: Diagnosis not present

## 2017-12-17 ENCOUNTER — Ambulatory Visit
Admission: RE | Admit: 2017-12-17 | Discharge: 2017-12-17 | Disposition: A | Discharge status: Home / Self Care | Payer: Medicare Other | Source: Ambulatory Visit | Attending: Radiation Oncology | Admitting: Radiation Oncology

## 2017-12-17 DIAGNOSIS — C61 Malignant neoplasm of prostate: Secondary | ICD-10-CM | POA: Diagnosis not present

## 2017-12-18 ENCOUNTER — Ambulatory Visit
Admission: RE | Admit: 2017-12-18 | Discharge: 2017-12-18 | Disposition: A | Discharge status: Home / Self Care | Payer: Medicare Other | Source: Ambulatory Visit | Attending: Radiation Oncology | Admitting: Radiation Oncology

## 2017-12-18 DIAGNOSIS — C61 Malignant neoplasm of prostate: Secondary | ICD-10-CM | POA: Diagnosis not present

## 2017-12-19 ENCOUNTER — Ambulatory Visit
Admission: RE | Admit: 2017-12-19 | Discharge: 2017-12-19 | Disposition: A | Discharge status: Home / Self Care | Payer: Medicare Other | Source: Ambulatory Visit | Attending: Radiation Oncology | Admitting: Radiation Oncology

## 2017-12-19 DIAGNOSIS — C61 Malignant neoplasm of prostate: Secondary | ICD-10-CM | POA: Diagnosis not present

## 2017-12-20 ENCOUNTER — Ambulatory Visit
Admission: RE | Admit: 2017-12-20 | Discharge: 2017-12-20 | Disposition: A | Discharge status: Home / Self Care | Payer: Medicare Other | Source: Ambulatory Visit | Attending: Radiation Oncology | Admitting: Radiation Oncology

## 2017-12-20 DIAGNOSIS — C61 Malignant neoplasm of prostate: Secondary | ICD-10-CM | POA: Diagnosis not present

## 2017-12-23 ENCOUNTER — Ambulatory Visit
Admission: RE | Admit: 2017-12-23 | Discharge: 2017-12-23 | Disposition: A | Discharge status: Home / Self Care | Payer: Medicare Other | Source: Ambulatory Visit | Attending: Radiation Oncology | Admitting: Radiation Oncology

## 2017-12-23 DIAGNOSIS — C61 Malignant neoplasm of prostate: Secondary | ICD-10-CM | POA: Diagnosis not present

## 2017-12-24 ENCOUNTER — Ambulatory Visit
Admission: RE | Admit: 2017-12-24 | Discharge: 2017-12-24 | Disposition: A | Discharge status: Home / Self Care | Payer: Medicare Other | Source: Ambulatory Visit | Attending: Radiation Oncology | Admitting: Radiation Oncology

## 2017-12-24 DIAGNOSIS — C61 Malignant neoplasm of prostate: Secondary | ICD-10-CM | POA: Diagnosis not present

## 2017-12-25 ENCOUNTER — Ambulatory Visit
Admission: RE | Admit: 2017-12-25 | Discharge: 2017-12-25 | Disposition: A | Discharge status: Home / Self Care | Payer: Medicare Other | Source: Ambulatory Visit | Attending: Radiation Oncology | Admitting: Radiation Oncology

## 2017-12-25 DIAGNOSIS — C61 Malignant neoplasm of prostate: Secondary | ICD-10-CM | POA: Diagnosis not present

## 2017-12-26 ENCOUNTER — Ambulatory Visit
Admission: RE | Admit: 2017-12-26 | Discharge: 2017-12-26 | Disposition: A | Discharge status: Home / Self Care | Payer: Medicare Other | Source: Ambulatory Visit | Attending: Radiation Oncology | Admitting: Radiation Oncology

## 2017-12-26 DIAGNOSIS — C61 Malignant neoplasm of prostate: Secondary | ICD-10-CM | POA: Diagnosis not present

## 2017-12-27 ENCOUNTER — Other Ambulatory Visit: Payer: Self-pay | Admitting: Radiation Oncology

## 2017-12-27 ENCOUNTER — Telehealth: Payer: Self-pay | Admitting: Radiation Oncology

## 2017-12-27 ENCOUNTER — Ambulatory Visit
Admission: RE | Admit: 2017-12-27 | Discharge: 2017-12-27 | Disposition: A | Discharge status: Home / Self Care | Payer: Medicare Other | Source: Ambulatory Visit | Attending: Radiation Oncology | Admitting: Radiation Oncology

## 2017-12-27 DIAGNOSIS — C61 Malignant neoplasm of prostate: Secondary | ICD-10-CM | POA: Diagnosis not present

## 2017-12-27 MED ORDER — TAMSULOSIN HCL 0.4 MG PO CAPS: 0.4000 mg | ORAL_CAPSULE | Freq: Every day | ORAL | 5 refills | Status: DC

## 2017-12-27 NOTE — Telephone Encounter (Signed)
Received voicemail message from patient's wife, Fraser Din, that pharmacy has not received flomax script. Phoned Magda Paganini at [facility] who confirmed receipt of script. Also, Magda Paganini reports the script will be ready for pick up in thirty minutes. Phoned patient back and informed him of these findings. Patient verbalized understanding and expressed appreciation for the return call.

## 2017-12-29 ENCOUNTER — Ambulatory Visit: Admission: RE | Admit: 2017-12-29 | Payer: Medicare Other | Source: Ambulatory Visit

## 2017-12-30 ENCOUNTER — Ambulatory Visit
Admission: RE | Admit: 2017-12-30 | Discharge: 2017-12-30 | Disposition: A | Discharge status: Home / Self Care | Payer: Medicare Other | Source: Ambulatory Visit | Attending: Radiation Oncology | Admitting: Radiation Oncology

## 2017-12-30 DIAGNOSIS — C61 Malignant neoplasm of prostate: Secondary | ICD-10-CM | POA: Diagnosis not present

## 2017-12-31 ENCOUNTER — Ambulatory Visit
Admission: RE | Admit: 2017-12-31 | Discharge: 2017-12-31 | Disposition: A | Discharge status: Home / Self Care | Payer: Medicare Other | Source: Ambulatory Visit | Attending: Radiation Oncology | Admitting: Radiation Oncology

## 2017-12-31 DIAGNOSIS — C61 Malignant neoplasm of prostate: Secondary | ICD-10-CM | POA: Diagnosis not present

## 2018-01-01 ENCOUNTER — Ambulatory Visit
Admission: RE | Admit: 2018-01-01 | Discharge: 2018-01-01 | Disposition: A | Discharge status: Home / Self Care | Payer: Medicare Other | Source: Ambulatory Visit | Attending: Radiation Oncology | Admitting: Radiation Oncology

## 2018-01-01 DIAGNOSIS — C61 Malignant neoplasm of prostate: Secondary | ICD-10-CM | POA: Diagnosis not present

## 2018-01-02 ENCOUNTER — Ambulatory Visit
Admission: RE | Admit: 2018-01-02 | Discharge: 2018-01-02 | Disposition: A | Discharge status: Home / Self Care | Payer: Medicare Other | Source: Ambulatory Visit | Attending: Radiation Oncology | Admitting: Radiation Oncology

## 2018-01-02 DIAGNOSIS — C61 Malignant neoplasm of prostate: Secondary | ICD-10-CM | POA: Diagnosis not present

## 2018-01-03 ENCOUNTER — Ambulatory Visit
Admission: RE | Admit: 2018-01-03 | Discharge: 2018-01-03 | Disposition: A | Discharge status: Home / Self Care | Payer: Medicare Other | Source: Ambulatory Visit | Attending: Radiation Oncology | Admitting: Radiation Oncology

## 2018-01-03 DIAGNOSIS — C61 Malignant neoplasm of prostate: Secondary | ICD-10-CM | POA: Diagnosis not present

## 2018-01-06 ENCOUNTER — Ambulatory Visit
Admission: RE | Admit: 2018-01-06 | Discharge: 2018-01-06 | Disposition: A | Discharge status: Home / Self Care | Payer: Medicare Other | Source: Ambulatory Visit | Attending: Radiation Oncology | Admitting: Radiation Oncology

## 2018-01-06 DIAGNOSIS — C61 Malignant neoplasm of prostate: Secondary | ICD-10-CM | POA: Diagnosis not present

## 2018-01-07 ENCOUNTER — Ambulatory Visit
Admission: RE | Admit: 2018-01-07 | Discharge: 2018-01-07 | Disposition: A | Discharge status: Home / Self Care | Payer: Medicare Other | Source: Ambulatory Visit | Attending: Radiation Oncology | Admitting: Radiation Oncology

## 2018-01-07 DIAGNOSIS — C61 Malignant neoplasm of prostate: Secondary | ICD-10-CM | POA: Diagnosis not present

## 2018-01-08 ENCOUNTER — Ambulatory Visit
Admission: RE | Admit: 2018-01-08 | Discharge: 2018-01-08 | Disposition: A | Discharge status: Home / Self Care | Payer: Medicare Other | Source: Ambulatory Visit | Attending: Radiation Oncology | Admitting: Radiation Oncology

## 2018-01-08 DIAGNOSIS — C61 Malignant neoplasm of prostate: Secondary | ICD-10-CM | POA: Diagnosis not present

## 2018-01-09 ENCOUNTER — Ambulatory Visit
Admission: RE | Admit: 2018-01-09 | Discharge: 2018-01-09 | Disposition: A | Discharge status: Home / Self Care | Payer: Medicare Other | Source: Ambulatory Visit | Attending: Radiation Oncology | Admitting: Radiation Oncology

## 2018-01-09 ENCOUNTER — Encounter: Payer: Self-pay | Admitting: Medical Oncology

## 2018-01-09 DIAGNOSIS — C61 Malignant neoplasm of prostate: Secondary | ICD-10-CM | POA: Diagnosis not present

## 2018-01-10 ENCOUNTER — Ambulatory Visit
Admission: RE | Admit: 2018-01-10 | Discharge: 2018-01-10 | Disposition: A | Discharge status: Home / Self Care | Payer: Medicare Other | Source: Ambulatory Visit | Attending: Radiation Oncology | Admitting: Radiation Oncology

## 2018-01-10 DIAGNOSIS — C61 Malignant neoplasm of prostate: Secondary | ICD-10-CM | POA: Diagnosis not present

## 2018-01-14 ENCOUNTER — Ambulatory Visit
Admission: RE | Admit: 2018-01-14 | Discharge: 2018-01-14 | Disposition: A | Discharge status: Home / Self Care | Payer: Medicare Other | Source: Ambulatory Visit | Attending: Radiation Oncology | Admitting: Radiation Oncology

## 2018-01-14 DIAGNOSIS — C61 Malignant neoplasm of prostate: Secondary | ICD-10-CM | POA: Insufficient documentation

## 2018-01-14 DIAGNOSIS — Z510 Encounter for antineoplastic radiation therapy: Secondary | ICD-10-CM | POA: Diagnosis not present

## 2018-01-15 ENCOUNTER — Ambulatory Visit
Admission: RE | Admit: 2018-01-15 | Discharge: 2018-01-15 | Disposition: A | Discharge status: Home / Self Care | Payer: Medicare Other | Source: Ambulatory Visit | Attending: Radiation Oncology | Admitting: Radiation Oncology

## 2018-01-15 DIAGNOSIS — Z510 Encounter for antineoplastic radiation therapy: Secondary | ICD-10-CM | POA: Diagnosis not present

## 2018-01-16 ENCOUNTER — Ambulatory Visit
Admission: RE | Admit: 2018-01-16 | Discharge: 2018-01-16 | Disposition: A | Discharge status: Home / Self Care | Payer: Medicare Other | Source: Ambulatory Visit | Attending: Radiation Oncology | Admitting: Radiation Oncology

## 2018-01-16 DIAGNOSIS — Z510 Encounter for antineoplastic radiation therapy: Secondary | ICD-10-CM | POA: Diagnosis not present

## 2018-01-17 ENCOUNTER — Encounter: Payer: Self-pay | Admitting: Medical Oncology

## 2018-01-17 ENCOUNTER — Ambulatory Visit
Admission: RE | Admit: 2018-01-17 | Discharge: 2018-01-17 | Disposition: A | Discharge status: Home / Self Care | Payer: Medicare Other | Source: Ambulatory Visit | Attending: Radiation Oncology | Admitting: Radiation Oncology

## 2018-01-17 DIAGNOSIS — Z510 Encounter for antineoplastic radiation therapy: Secondary | ICD-10-CM | POA: Diagnosis not present

## 2018-01-20 ENCOUNTER — Encounter: Payer: Self-pay | Admitting: Radiation Oncology

## 2018-01-20 NOTE — Progress Notes (Signed)
  Radiation Oncology         [phone] ________________________________  Name: MELQUIADES KOVAR MRN: [id number]  Date: 01/20/2018  DOB: [birth year]  End of Treatment Note  Diagnosis:   72 y.o.gentleman with stage T2aadenocarcinoma of the prostate with a Gleason's score of4+3and a PSA of 4.73    Indication for treatment:  Curative, Definitive Radiotherapy       Radiation treatment dates:   12/10/17 - 01/17/18  Site/dose:   The prostate was treated to 70 Gy in 28 fractions of 2.5 Gy  Beams/energy:   The patient was treated with IMRT using volumetric arc therapy delivering 6 MV X-rays to clockwise and counterclockwise circumferential arcs with a 90 degree collimator offset to avoid dose scalloping.  Image guidance was performed with daily cone beam CT prior to each fraction to align to gold markers in the prostate and assure proper bladder and rectal fill volumes.  Immobilization was achieved with BodyFix custom mold.  Narrative: The patient tolerated radiation treatment relatively well.   He experienced some minor urinary irritation and modest fatigue.  He reported increasing symptoms of nocturia from x2 up to x5, fatigue, dysuria, urinary urgency, and difficulty in emptying his bladder as treatments went on.  He denied pain, urinary leakage or incontinence, issues with his stream, diarrhea, or hematuria throughout treatment. By the end of treatments, he reported soft bowel movements and occasional hesitancy and straining.  Plan: The patient has completed radiation treatment. He will return to radiation oncology clinic for routine followup in one month. I advised him to call or return sooner if he has any questions or concerns related to his recovery or treatment. ________________________________  Sheral Apley. Tammi Klippel, M.D.  This document serves as a record of services personally performed by Tyler Pita, MD. It was created on his behalf by Wilburn Mylar, a trained medical scribe. The  creation of this record is based on the scribe's personal observations and the provider's statements to them. This document has been checked and approved by the attending provider.

## 2018-02-19 ENCOUNTER — Other Ambulatory Visit: Payer: Self-pay

## 2018-02-19 ENCOUNTER — Ambulatory Visit
Admission: RE | Admit: 2018-02-19 | Discharge: 2018-02-19 | Disposition: A | Discharge status: Home / Self Care | Payer: Medicare Other | Source: Ambulatory Visit | Attending: Urology | Admitting: Urology

## 2018-02-19 ENCOUNTER — Encounter: Payer: Self-pay | Admitting: Urology

## 2018-02-19 VITALS — BP 131/82 | HR 78 | Temp 97.8°F | Resp 16 | Ht 66.0 in | Wt 174.6 lb

## 2018-02-19 DIAGNOSIS — Z923 Personal history of irradiation: Secondary | ICD-10-CM | POA: Diagnosis not present

## 2018-02-19 DIAGNOSIS — C61 Malignant neoplasm of prostate: Secondary | ICD-10-CM | POA: Insufficient documentation

## 2018-02-19 DIAGNOSIS — Z79899 Other long term (current) drug therapy: Secondary | ICD-10-CM | POA: Diagnosis not present

## 2018-02-19 NOTE — Progress Notes (Signed)
Radiation Oncology         [phone] ________________________________  Name: Alexander Cox MRN: [id number]  Date: 02/19/2018  DOB: 03/21/1946  Post Treatment Note  CC: Alexander Neer, MD  Alexander Seal, MD  Diagnosis:   72 y.o.gentleman with stage T2aadenocarcinoma of the prostate with a Gleason's score of4+3and a PSA of 4.73    Interval Since Last Radiation:  5 weeks  12/10/17 - 01/17/18: The prostate was treated to 70 Gy in 28 fractions of 2.5 Gy  Narrative:  The patient returns today for routine follow-up.  He tolerated radiation treatment relatively well.   He experienced some minor urinary irritation and modest fatigue as well as increasing symptoms of nocturia from x2 up to x5, fatigue, dysuria, urinary urgency, and difficulty in emptying his bladder as treatments went on.  He denied pain, urinary leakage or incontinence, issues with his stream, diarrhea, or hematuria throughout treatment. By the end of treatments, he reported soft bowel movements and occasional hesitancy and straining.  He was maintained on ADT throughout his treatments and continued to tolerate this fairly well.                            On review of systems, the patient states that he is doing well overall.  He continues with moderate LUTS with an IPSS score of 11.  He reports mild increased daytime frequency, intermittency, weak stream and nocturia x2 per night.  He specifically denies dysuria, gross hematuria, difficulty emptying his bladder or incontinence.  He reports a healthy appetite and is maintaining his weight.  He denies abdominal pain, nausea, vomiting, diarrhea or constipation.  He continues to tolerate ADT fairly well despite hot flashes and modest fatigue.  He does feel that his energy level is gradually improving over the past 1 to 2 weeks.  ALLERGIES:  is allergic to gluten meal.  Meds: Current Outpatient Medications  Medication Sig Dispense Refill  . calcium carbonate (OS-CAL) 600 MG TABS  tablet Take 600 mg by mouth daily with breakfast.    . Cholecalciferol (VITAMIN D PO) Take 1,000 Units by mouth daily.    Marland Kitchen gabapentin (NEURONTIN) 600 MG tablet Take 1 tablet (600 mg total) by mouth 4 (four) times daily. 1 tablet twice daily and 2 tablets at night (Patient taking differently: Take 300 mg by mouth 4 (four) times daily. ) 120 tablet 3  . hydrochlorothiazide (HYDRODIURIL) 25 MG tablet Take 25 mg by mouth daily.     Marland Kitchen lisinopril (PRINIVIL,ZESTRIL) 20 MG tablet Take 20 mg by mouth daily.     . RABEprazole (ACIPHEX) 20 MG tablet Take 20 mg by mouth daily.     . simvastatin (ZOCOR) 20 MG tablet Take 20 mg by mouth daily.    . tamsulosin (FLOMAX) 0.4 MG CAPS capsule Take 1 capsule (0.4 mg total) by mouth daily after supper. 30 capsule 5  . HYDROcodone-acetaminophen (NORCO/VICODIN) 5-325 MG tablet Take 1 tablet by mouth every 4 (four) hours as needed for moderate pain. (Patient not taking: Reported on 02/19/2018) 6 tablet 0   No current facility-administered medications for this encounter.     Physical Findings:  height is 5\' 6"  (1.676 m) and weight is 174 lb 9.6 oz (79.2 kg). His oral temperature is 97.8 F (36.6 C). His blood pressure is 131/82 and his pulse is 78. His respiration is 16 and oxygen saturation is 99%.  Pain Assessment Pain Score: 0-No pain/10 In general  this is a well appearing Caucasian male in no acute distress.  He's alert and oriented x4 and appropriate throughout the examination. Cardiopulmonary assessment is negative for acute distress and he exhibits normal effort.   Lab Findings: Lab Results  Component Value Date   WBC 6.4 11/27/2017   HGB 15.1 11/27/2017   HCT 44.6 11/27/2017   MCV 89.7 11/27/2017   PLT 254 11/27/2017     Radiographic Findings: No results found.  Impression/Plan: 1. 72 y.o.gentleman with stage T2aadenocarcinoma of the prostate with a Gleason's score of4+3and a PSA of 4.73.   He will continue to follow up with urology for  ongoing PSA determinations but does not yet have an appointment scheduled with Dr. Jeffie Cox to his knowledge.  He anticipates completing a 2-year course of ADT with his next injection being due in January 2020.  He understands what to expect with regards to PSA monitoring going forward. I will look forward to following his response to treatment via correspondence with urology, and would be happy to continue to participate in his care if clinically indicated. I talked to the patient about what to expect in the future, including his risk for erectile dysfunction and rectal bleeding. I encouraged him to call or return to the office if he has any questions regarding his previous radiation or possible radiation side effects. He was comfortable with this plan and will follow up as needed.    Alexander Johns, PA-C

## 2018-07-25 ENCOUNTER — Other Ambulatory Visit: Payer: Self-pay | Admitting: Family Medicine

## 2018-07-25 DIAGNOSIS — M810 Age-related osteoporosis without current pathological fracture: Secondary | ICD-10-CM

## 2018-09-26 ENCOUNTER — Other Ambulatory Visit: Payer: Medicare Other

## 2018-11-12 ENCOUNTER — Other Ambulatory Visit: Payer: Self-pay

## 2018-11-12 ENCOUNTER — Ambulatory Visit
Admission: RE | Admit: 2018-11-12 | Discharge: 2018-11-12 | Disposition: A | Discharge status: Home / Self Care | Payer: Medicare Other | Source: Ambulatory Visit | Attending: Family Medicine | Admitting: Family Medicine

## 2018-11-12 DIAGNOSIS — M810 Age-related osteoporosis without current pathological fracture: Secondary | ICD-10-CM

## 2018-12-12 ENCOUNTER — Other Ambulatory Visit: Payer: Self-pay | Admitting: Urology

## 2018-12-17 NOTE — Patient Instructions (Addendum)
YOU NEED TO HAVE A COVID 19 TEST ON_8/7______ @10 :30______, THIS TEST MUST BE DONE BEFORE SURGERY , COME  [address], [place] , [address].  ONCE YOUR COVID TEST IS COMPLETED, PLEASE BEGIN THE QUARANTINE INSTRUCTIONS AS OUTLINED IN YOUR HANDOUT.                [place]   Your procedure is scheduled on: Tuesday 12/23/18   Report to [facility] Main  Entrance Report to Short stay at 5:30 AM. No family in short stay at this time.                                1 VISITOR IS ALLOWED TO WAIT IN WAITING ROOM  ONLY DAY OF YOUR SURGERY.    Call this number if you have problems the morning of surgery [phone]     Remember: Do not eat food or drink liquids :After Midnight.  BRUSH YOUR TEETH MORNING OF SURGERY AND RINSE YOUR MOUTH OUT, NO CHEWING GUM CANDY OR MINTS.     Take these medicines the morning of surgery with A SIP OF WATER:Gabapentin                                  You may not have any metal on your body including piercings              Do not wear jewelry, lotions, powders or deodorant                     Men may shave face and neck.   Do not bring valuables to the hospital. [place].  Contacts, dentures or bridgework may not be worn into surgery.       Patients discharged the day of surgery will not be allowed to drive home.  IF YOU ARE HAVING SURGERY AND GOING HOME THE SAME DAY, YOU MUST HAVE AN ADULT TO DRIVE YOU HOME AND BE WITH YOU FOR 24 HOURS . YOU MAY GO HOME BY TAXI OR UBER OR ORTHERWISE, BUT AN ADULT MUST ACCOMPANY YOU HOME AND STAY WITH YOU FOR 24 HOURS.  Name and phone number of your driver:  Special Instructions: N/A              [facility] - Preparing for Surgery Before surgery, you can play an important role.   Because skin is not sterile, your skin needs to be as free of germs as possible .  You can reduce the number of germs on your skin by washing with CHG  (chlorahexidine gluconate) soap before surgery.   CHG is an antiseptic cleaner which kills germs and bonds with the skin to continue killing germs even after washing. Please DO NOT use if you have an allergy to CHG or antibacterial soaps.   If your skin becomes reddened/irritated stop using the CHG and inform your nurse when you arrive at Short Stay.  You may shave your face/neck. Please follow these instructions carefully:  1.  Shower with CHG Soap the night before surgery and the  morning of Surgery.  2.  If you choose to wash your hair, wash your hair first as usual with your  normal  shampoo.  3.  After  you shampoo, rinse your hair and body thoroughly to remove the  shampoo.                                        4.  Use CHG as you would any other liquid soap.  You can apply chg directly  to the skin and wash                       Gently with a scrungie or clean washcloth.  5.  Apply the CHG Soap to your body ONLY FROM THE NECK DOWN.   Do not use on face/ open                           Wound or open sores. Avoid contact with eyes, ears mouth and genitals (private parts).                       Wash face,  Genitals (private parts) with your normal soap.             6.  Wash thoroughly, paying special attention to the area where your surgery  will be performed.  7.  Thoroughly rinse your body with warm water from the neck down.  8.  DO NOT shower/wash with your normal soap after using and rinsing off  the CHG Soap.             9.  Pat yourself dry with a clean towel.            10.  Wear clean pajamas.            11.  Place clean sheets on your bed the night of your first shower and do not  sleep with pets.  Day of Surgery : Do not apply any lotions/deodorants the morning of surgery.  Please wear clean clothes to the hospital/surgery center.   FAILURE TO FOLLOW THESE INSTRUCTIONS MAY RESULT IN THE CANCELLATION OF YOUR SURGERY PATIENT SIGNATURE_________________________________  NURSE  SIGNATURE__________________________________  ________________________________________________________________________

## 2018-12-18 ENCOUNTER — Encounter (INDEPENDENT_AMBULATORY_CARE_PROVIDER_SITE_OTHER): Payer: Self-pay

## 2018-12-18 ENCOUNTER — Other Ambulatory Visit: Payer: Self-pay

## 2018-12-18 ENCOUNTER — Encounter (HOSPITAL_COMMUNITY): Payer: Self-pay

## 2018-12-18 ENCOUNTER — Encounter (HOSPITAL_COMMUNITY)
Admission: RE | Admit: 2018-12-18 | Discharge: 2018-12-18 | Disposition: A | Discharge status: Home / Self Care | Payer: Medicare Other | Source: Ambulatory Visit | Attending: Urology | Admitting: Urology

## 2018-12-18 DIAGNOSIS — Z79899 Other long term (current) drug therapy: Secondary | ICD-10-CM | POA: Insufficient documentation

## 2018-12-18 DIAGNOSIS — Z7982 Long term (current) use of aspirin: Secondary | ICD-10-CM | POA: Diagnosis not present

## 2018-12-18 DIAGNOSIS — Z20828 Contact with and (suspected) exposure to other viral communicable diseases: Secondary | ICD-10-CM | POA: Diagnosis not present

## 2018-12-18 DIAGNOSIS — N201 Calculus of ureter: Secondary | ICD-10-CM | POA: Insufficient documentation

## 2018-12-18 DIAGNOSIS — I272 Pulmonary hypertension, unspecified: Secondary | ICD-10-CM | POA: Diagnosis not present

## 2018-12-18 DIAGNOSIS — Z87891 Personal history of nicotine dependence: Secondary | ICD-10-CM | POA: Diagnosis not present

## 2018-12-18 DIAGNOSIS — K219 Gastro-esophageal reflux disease without esophagitis: Secondary | ICD-10-CM | POA: Insufficient documentation

## 2018-12-18 DIAGNOSIS — Z01818 Encounter for other preprocedural examination: Secondary | ICD-10-CM | POA: Insufficient documentation

## 2018-12-18 DIAGNOSIS — I10 Essential (primary) hypertension: Secondary | ICD-10-CM | POA: Insufficient documentation

## 2018-12-18 DIAGNOSIS — E7800 Pure hypercholesterolemia, unspecified: Secondary | ICD-10-CM | POA: Diagnosis not present

## 2018-12-18 LAB — CBC
HCT: 41.9 % (ref 39.0–52.0)
Hemoglobin: 13.5 g/dL (ref 13.0–17.0)
MCH: 30.2 pg (ref 26.0–34.0)
MCHC: 32.2 g/dL (ref 30.0–36.0)
MCV: 93.7 fL (ref 80.0–100.0)
Platelets: 229 10*3/uL (ref 150–400)
RBC: 4.47 MIL/uL (ref 4.22–5.81)
RDW: 11.9 % (ref 11.5–15.5)
WBC: 4.8 10*3/uL (ref 4.0–10.5)
nRBC: 0 % (ref 0.0–0.2)

## 2018-12-18 LAB — BASIC METABOLIC PANEL
Anion gap: 9 (ref 5–15)
BUN: 26 mg/dL — ABNORMAL HIGH (ref 8–23)
CO2: 29 mmol/L (ref 22–32)
Calcium: 9.6 mg/dL (ref 8.9–10.3)
Chloride: 102 mmol/L (ref 98–111)
Creatinine, Ser: 1.34 mg/dL — ABNORMAL HIGH (ref 0.61–1.24)
GFR calc Af Amer: >60 mL/min (ref >60)
GFR calc non Af Amer: 53 mL/min — ABNORMAL LOW (ref >60)
Glucose, Bld: 108 mg/dL — ABNORMAL HIGH (ref 70–99)
Potassium: 4 mmol/L (ref 3.5–5.1)
Sodium: 140 mmol/L (ref 135–145)

## 2018-12-18 NOTE — Progress Notes (Signed)
Dr. Jeffie Pollock instructed pt to stop ASA 12/16/18 EKG and Labs are in [facility]

## 2018-12-19 ENCOUNTER — Other Ambulatory Visit (HOSPITAL_COMMUNITY)
Admission: RE | Admit: 2018-12-19 | Discharge: 2018-12-19 | Disposition: A | Discharge status: Home / Self Care | Payer: Medicare Other | Source: Ambulatory Visit | Attending: Urology | Admitting: Urology

## 2018-12-19 DIAGNOSIS — Z01818 Encounter for other preprocedural examination: Secondary | ICD-10-CM | POA: Diagnosis not present

## 2018-12-19 LAB — SARS CORONAVIRUS 2 (TAT 6-24 HRS): SARS Coronavirus 2: NEGATIVE

## 2018-12-19 NOTE — Progress Notes (Signed)
Anesthesia Chart Review   Case: [id number] Date/Time: 12/23/18 0715   Procedure: CYSTOSCOPY RIGHT RETROGRADE PYELOGRAM URETEROSCOPY/HOLMIUM LASER/STENT PLACEMENT (Right )   Anesthesia type: General   Pre-op diagnosis: RIGHT URETERAL CALCULUS   Location: [place] / [facility]   Surgeon: Irine Seal, MD      DISCUSSION:73 y.o. former smoker (2.5 pack years, quit 05/15/67) with h/o HTN, GERD, moderate pulmonary HTN, right ureteral calculus scheduled for above procedure 12/23/2018 with Dr. Irine Seal.   Stable at PAT visit.  S/p ureterscopy/stone extraction 11/28/17 with no anesthesia complications noted.  Discussed with Dr. Fransisco Beau.  Anticipate pt can proceed with planned procedure barring acute status change.   VS: BP (!) 142/70 (BP Location: Left Arm)   Pulse 75   Temp 36.8 C (Oral)   Resp 18   Ht 5\' 7"  (1.702 m)   Wt 79.9 kg   SpO2 100%   BMI 27.60 kg/m   PROVIDERS: Mayra Neer, MD is PCP   Minus Breeding, MD is Cardiologist  LABS: Labs reviewed: Acceptable for surgery. (all labs ordered are listed, but only abnormal results are displayed)  Labs Reviewed  BASIC METABOLIC PANEL - Abnormal; Notable for the following components:      Result Value   Glucose, Bld 108 (*)    BUN 26 (*)    Creatinine, Ser 1.34 (*)    GFR calc non Af Amer 53 (*)    All other components within normal limits  CBC     IMAGES:   EKG: 11/27/2017 Rate 65 bpm Sinus rhythm with frequent Premature ventricular complexes  CV: Echo 12/02/2013 Study Conclusions -Left ventricle: The cavity size was normal.  Systolic function was normal.  The estimated ejection fraction was in the rage of 50% to 55%.  There was an increased relative contribution of atrial contraction to ventricular filling.  Doppler parameters are consistent with abnormal left ventricular relaxation (grad 1 diastolic dysfunction).  -Tricuspid valve: There was mild regurgitation -Pulmonary arteries: PA peak pressure: 31mm Hg.  Past  Medical History:  Diagnosis Date  . Asthma    hx child  . Back injury 2014   Fell off ladder and broke back, BP medication being adjusted and passed out while on ladder  . Celiac disease   . DDD (degenerative disc disease), lumbar   . Elevated PSA   . GERD (gastroesophageal reflux disease)   . Grade I diastolic dysfunction   . High cholesterol   . History of kidney stones   . Hypertension   . Neuromuscular disorder ([place])    Nerve damage due to history of broken back  . Prostate cancer ([place])   . Pulmonary hypertension ([facility])    Moderate  . Wears hearing aid     Past Surgical History:  Procedure Laterality Date  . BACK SURGERY  7  . BIOPSY PROSTATE  08/14/2017  . COLONOSCOPY    . CYSTOSCOPY/RETROGRADE/URETEROSCOPY/STONE EXTRACTION WITH BASKET Right 11/28/2017   Procedure: RIGHT URETEROSCOPY/STONE EXTRACTION WITH BASKET;  Surgeon: Irine Seal, MD;  Location: [facility];  Service: Urology;  Laterality: Right;  . EXTRACORPOREAL SHOCK WAVE LITHOTRIPSY Right 10/10/2017   Procedure: RIGHT EXTRACORPOREAL SHOCK WAVE LITHOTRIPSY (ESWL);  Surgeon: Raynelle Bring, MD;  Location: [facility];  Service: Urology;  Laterality: Right;  . HERNIA REPAIR Left    teen  . TRANSRECTAL ULTRASOUND  08/14/2017    MEDICATIONS: . aspirin EC 81 MG tablet  . calcium carbonate (OS-CAL) 600 MG TABS tablet  . cholecalciferol (VITAMIN D) 25 MCG (  1000 UT) tablet  . Coenzyme Q10 (COQ10) 200 MG CAPS  . gabapentin (NEURONTIN) 300 MG capsule  . hydrochlorothiazide (HYDRODIURIL) 25 MG tablet  . lisinopril (ZESTRIL) 10 MG tablet  . Multiple Vitamin (MULTIVITAMIN WITH MINERALS) TABS tablet  . Omega-3 Fatty Acids (FISH OIL) 1000 MG CAPS  . pravastatin (PRAVACHOL) 10 MG tablet  . RABEprazole (ACIPHEX) 20 MG tablet  . Tetrahydroz-Polyvinyl Al-Povid (CLEAR EYES TRIPLE ACTION) 0.05-0.5-0.6 % SOLN   No current facility-administered medications for this encounter.     Maia Plan [facility] Pre-Surgical Testing [phone] 12/22/18  10:24 AM

## 2018-12-22 NOTE — H&P (Signed)
CC: I have prostate cancer.  HPI: Alexander Cox is a 73 year-old male established patient who is here evaluation for treatment of prostate cancer.  12/10/18: Patient with below noted history. He also has history of urolithiasis. He returns today for follow-up and Lupron injection. Most recent PSA was noted to be undetectable and testosterone remains castrate level. Overall, he states that symptoms remain stable. He will have hot flashes intermittently. He denies recent weight loss or bone pain. He continues to complain of nocturia X 2-3, but otherwise his voiding symptoms are relatively stable. He continues to feel he voids with adequate FOS and generally feels he empties well. He has not been using Tamsulosin, as he did not feel he saw much benefit from it. He denies dysuria or gross hematuria. He denies recent passage of stone material. He denies interval or current unilateral flank pain.   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 There remaining cores had 7(3+4) and 6 disease. He remains on ADT and has completed EXRT. He is due for Lupron today. His PSA was 0.51 in 7/19 with a castrate testosterone. He is voiding ok with an IPSS of 12. He has some frequency and nocturia. He has had no hematuria.     AUA Symptom Score: Less than 20% of the time he has the sensation of not emptying his bladder completely when finished urinating. 50% of the time he has to urinate again fewer than two hours after he has finished urinating. Less than 20% of the time he has to start and stop again several times when he urinates. Less than 20% of the time he finds it difficult to postpone urination. 50% of the time he has a weak urinary stream. Less than 20% of the time he has to push or strain to begin urination. He has to get up to urinate 3 times from the time he goes to bed until the time he gets up in the morning.    Calculated AUA Symptom Score: 13    QOL Score: He would feel mixed if he had to live with his urinary condition the way it is now for the rest of his life.   Calculated QOL Symptom Score: 3    ALLERGIES: None   MEDICATIONS: Hydrochlorothiazide 25 mg tablet  Lisinopril 20 mg tablet  Tamsulosin Hcl 0.4 mg capsule TAKE 1 CAPSULE AT BEDTIME  Aspirin Ec 81 mg tablet, delayed release  Calcium  Co Q10  Fish Oil  Gabapentin 300 mg capsule  Multivitamin  Rabeprazole Sodium 20 mg tablet, delayed release  Vitamin D3     GU PSH: ESWL - 10/10/2017 Locm 300-399Mg /Ml Iodine,1Ml - 09/02/2017 PLACE RT DEVICE/MARKER, PROS - 11/26/2017 Prostate Needle Biopsy - 08/14/2017 Transperineal Plmt Biodegradable Matrl 1/Mlt Njx - 11/26/2017 Ureteroscopic stone removal, Right - 11/28/2017     NON-GU PSH: Back surgery Hernia Repair Surgical Pathology, Gross And Microscopic Examination For Prostate Needle - 08/14/2017     GU PMH: History of urolithiasis, I will get a KUB in 6 months. - 06/09/2018 Prostate Cancer, He is doing well on ADT s/p EXRT but is due for labs today. He was given his 2nd of 3 lupron injections and will return in 6 months with labs for his next injection.. - 06/09/2018, - 11/26/2017 (Improving), He is doing well on ADT and was given Lupron today. He is to have [employer]  and fiducials on 7/16. , - 11/18/2017, T2a N0 M0 Gleason 7(4+3) high volume prostate cancer in a 91ml gland. He has intermediate to high risk disease. He is not a good candidate for AS. He has moderate LUTS and severe ED. I reviewed the treatment options as noted below and will have him see Dr. Tammi Klippel for consideration of radiation and ADT which he will need for 18 months. At his age of 75 and his high risk of locally advanced disease, I think this is his best option. I did mention the PROGRESS trial of ADT with RP but he is most interested in radiation. , - 09/11/2017 Flank Pain - 12/09/2017 Renal calculus - 10/24/2017 Renal and  ureteral calculus - 09/24/2017 ED due to arterial insufficiency - 09/11/2017 Ureteral calculus, I failed to mention the stone during our visit and will call him to discuss that further. - 09/11/2017 Elevated PSA - 08/14/2017, He has a rapidly rising PSA with right apical induration and a family history of prostate cancer. I will get him set up for a biopsy and reviewed the risks of bleeding, infection and voiding difficulty. Levaquin sent. , - 07/24/2017 Prostate nodule w/ LUTS, He has mild-moderate LUTs. 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 I will check a testosterone since I have found low testosterone to be associated with more aggressive prostate cancer. - 07/24/2017 Weak Urinary Stream - 07/24/2017    NON-GU PMH: Asthma GERD Hypercholesterolemia Hypertension    FAMILY HISTORY: Alzheimer's Disease - Father Hypertension - Mother Prostate Cancer - Father   SOCIAL HISTORY: Marital Status: Married Preferred Language: English; Race: White Current Smoking Status: Patient does not smoke anymore.   Tobacco Use Assessment Completed: Used Tobacco in last 30 days? Drinks 1 caffeinated drink per day.     Notes: 2 sons    REVIEW OF SYSTEMS:    GU Review Male:   Patient reports frequent urination. Patient denies hard to postpone urination, burning/ pain with urination, get up at night to urinate, leakage of urine, stream starts and stops, trouble starting your stream, have to strain to urinate , erection problems, and penile pain.  Gastrointestinal (Upper):   Patient denies nausea, vomiting, and indigestion/ heartburn.  Gastrointestinal (Lower):   Patient denies diarrhea and constipation.  Constitutional:   Patient denies fever, night sweats, weight loss, and fatigue.  Skin:   Patient denies skin rash/ lesion and itching.  Eyes:   Patient denies blurred vision and double vision.  Ears/ Nose/ Throat:   Patient denies sore throat and sinus  problems.  Hematologic/Lymphatic:   Patient denies swollen glands and easy bruising.  Cardiovascular:   Patient denies leg swelling and chest pains.  Respiratory:   Patient denies cough and shortness of breath.  Endocrine:   Patient denies excessive thirst.  Musculoskeletal:   Patient denies back pain and joint pain.  Neurological:   Patient denies headaches and dizziness.  Psychologic:   Patient denies depression and anxiety.   VITAL SIGNS:      12/10/2018 09:10 AM  Weight 170 lb / 77.11 kg  Height 66 in / 167.64 cm  BP 133/70 mmHg  Pulse 45 /min  Temperature 98.4 F / 36.8 C  BMI 27.4 kg/m   MULTI-SYSTEM PHYSICAL EXAMINATION:    Constitutional: Well-nourished. No physical deformities. Normally developed. Good grooming.  Respiratory: No labored breathing, no use of accessory muscles.   Cardiovascular: Normal temperature, normal extremity pulses, no swelling, no varicosities.  Lymphatic: No  enlargement, no tenderness of axillae, supraclavicular, neck lymph nodes.  Neurologic / Psychiatric: Oriented to time, oriented to place, oriented to person. No depression, no anxiety, no agitation.  Gastrointestinal: No mass, no tenderness, no rigidity, non obese abdomen.  Musculoskeletal: Normal gait and station of head and neck.     PAST DATA REVIEWED:  Source Of History:  Patient  Records Review:   Previous Patient Records  Urine Test Review:   Urinalysis  Urodynamics Review:   Review Bladder Scan   12/05/18 06/09/18 11/12/17 06/26/17 06/01/16 05/31/14 02/03/14 01/23/13  PSA  Total PSA <0.015 ng/mL <0.015 ng/mL 0.51 ng/mL 4.73 ng/dl 3.16 ng/dl 1.18 ng/dl 0.87 ng/dl 0.82 ng/dl    12/04/18 06/09/18 11/12/17 07/24/17  Hormones  Testosterone, Total <10 ng/dL <10 ng/dL <10 ng/dL 591.4 ng/dL    PROCEDURES:         Renal Ultrasound (Limited) - 42353  Right Kidney: Length: 10.02 cm Depth: 5.35 cm Cortical Width: 1.66 cm Width: 4.34 cm    Right Kidney/Ureter:  1)Mid Pole Cystic Area  measuring 1.54cm x 1.48cm x 1.64cm, no blood flow-----2)Multiple Stones visualized - a)0.63cm Upper Pole Stone--b)Lower Pole Stones measuring 0.37cm, 0.38cm, and 0.50cm-----3) Several Subcentimeter hyperechoic areas with and without shadowing largest in [employer] measuring 0.50cm- ? stones vs vascular calcifications   Bladder:  PVR = 30.30ml------Unable to visualize either ureteral jet      Patient confirmed No Neulasta OnPro Device.             KUB - [id number]  A single view of the abdomen is obtained. No obvious opacities noted within the confines of bilateral renal shadows or along the expected anatomical course of the left ureter. Noted along the course of the right distal ureter, in the vicinity of the right UVJ, there is an approximatly 5 X 5 mm opacity concerning for calculus. Stable pelvic calcification noted. Normal appearing bowel gas pattern. Lumbar hardware noted.       Patient confirmed No Neulasta OnPro Device.            Urinalysis - 81003 Dipstick Dipstick Cont'd  Color: Yellow Bilirubin: Neg  Appearance: Clear Ketones: Neg  Specific Gravity: 1.025 Blood: Neg  pH: 5.5 Protein: Neg  Glucose: Neg Urobilinogen: 0.2    Nitrites: Neg    Leukocyte Esterase: Neg    Notes:            Lupron Depot 45 mg / 6 Month - 96402, I1443 The hip was sterilely prepped with alcohol. Lupron was injected intramuscularly (IM) using standard technique. The patient tolerated the procedure well. A band aid was applied. The site was dry when the patient left the exam room. The patient will return as scheduled for his next Lupron injection.   After treatment was administered, patient was observed to be symptom-free for 20 minutes.   Qty: 45 Adm. By: Providence Lanius  Unit: mg Lot No [id number]  Route: IM Exp. Date 04/24/2021  Freq: None Mfgr.:   Site: Right Buttock   ASSESSMENT:      ICD-10 Details  1 GU:   Prostate Cancer - C61   2   History of urolithiasis - Z87.442   3   Ureteral  calculus - N20.1   4   Nocturia - R35.1    PLAN:           Orders X-Rays: Renal Ultrasound (Limited) - right   X-Ray Notes: History:  Hematuria: Yes/No  Patient to see MD after exam: Yes/No  Previous exam:  CT / IVP/ US/ KUB/ None  When:  Where:  Diabetic: Yes/ No  BUN/ Creatinine:  Date of last BUN Creatinine:  Weight in pounds:  Allergy- IV Contrast: Yes/ No  Conflicting diabetic meds: Yes/ No  Diabetic Meds:  Prior Authorization #:            Schedule Procedure: 12/10/2018 at [facility] - [phone] - Lupron Depot 45 mg / 6 Month (Depolupron 1 3 4  Month) - [id number], [id number]          Document Letter(s):  Created for Patient: Clinical Summary         Notes:   Urinalysis today is without infectious parameters or hematuria. IPSS today is relatively stable at 13. On KUB imaging today, there are no obvious opacities noted within the confines of bilateral renal shadows or along the expected anatomical course of the left ureter. Noted along the course of the right distal ureter, in the vicinity of the right UVJ, there is an approximatly 5 X 5 mm opacity concerning for calculus. Right RUS shows no overt hydronephrosis. I encouraged that he continue to hydrate well and strain his urine. I also recommended that he restart Tamsulosin. In regards to prostate cancers, his PSA remains undetectable and testosterone remains castrate. He will receive scheduled Lupron today. Follow up will be pending discussed with his urologist. Return precuations reviewed for fevers or progressive pain, nausea, or vomiting.

## 2018-12-22 NOTE — Anesthesia Preprocedure Evaluation (Addendum)
Anesthesia Evaluation  Patient identified by MRN, date of birth, ID band Patient awake    Reviewed: Allergy & Precautions, NPO status , Patient's Chart, lab work & pertinent test results  Airway Mallampati: I       Dental no notable dental hx. (+) Teeth Intact   Pulmonary former smoker,    Pulmonary exam normal breath sounds clear to auscultation       Cardiovascular hypertension, Pt. on medications Normal cardiovascular exam Rhythm:Regular Rate:Normal     Neuro/Psych    GI/Hepatic   Endo/Other    Renal/GU      Musculoskeletal   Abdominal Normal abdominal exam  (+)   Peds  Hematology   Anesthesia Other Findings   Reproductive/Obstetrics                           Anesthesia Physical Anesthesia Plan  ASA: III  Anesthesia Plan: General   Post-op Pain Management:    Induction: Intravenous  PONV Risk Score and Plan:   Airway Management Planned: LMA  Additional Equipment:   Intra-op Plan:   Post-operative Plan: Extubation in OR  Informed Consent: I have reviewed the patients History and Physical, chart, labs and discussed the procedure including the risks, benefits and alternatives for the proposed anesthesia with the patient or authorized representative who has indicated his/her understanding and acceptance.     Dental advisory given  Plan Discussed with: CRNA  Anesthesia Plan Comments: (See PAT note 12/18/2018, Konrad Felix, PA-C)      Anesthesia Quick Evaluation

## 2018-12-23 ENCOUNTER — Other Ambulatory Visit: Payer: Self-pay

## 2018-12-23 ENCOUNTER — Ambulatory Visit (HOSPITAL_COMMUNITY): Payer: Medicare Other | Admitting: Anesthesiology

## 2018-12-23 ENCOUNTER — Ambulatory Visit (HOSPITAL_COMMUNITY): Payer: Medicare Other

## 2018-12-23 ENCOUNTER — Ambulatory Visit (HOSPITAL_COMMUNITY)
Admission: RE | Admit: 2018-12-23 | Discharge: 2018-12-23 | Disposition: A | Discharge status: Home / Self Care | Payer: Medicare Other | Source: Other Acute Inpatient Hospital | Attending: Urology | Admitting: Urology

## 2018-12-23 ENCOUNTER — Ambulatory Visit (HOSPITAL_COMMUNITY): Payer: Medicare Other | Admitting: Physician Assistant

## 2018-12-23 ENCOUNTER — Encounter (HOSPITAL_COMMUNITY)
Admission: RE | Disposition: A | Discharge status: Home / Self Care | Payer: Self-pay | Source: Other Acute Inpatient Hospital | Attending: Urology

## 2018-12-23 ENCOUNTER — Encounter (HOSPITAL_COMMUNITY): Payer: Self-pay

## 2018-12-23 DIAGNOSIS — Z8042 Family history of malignant neoplasm of prostate: Secondary | ICD-10-CM | POA: Insufficient documentation

## 2018-12-23 DIAGNOSIS — Z87891 Personal history of nicotine dependence: Secondary | ICD-10-CM | POA: Diagnosis not present

## 2018-12-23 DIAGNOSIS — C61 Malignant neoplasm of prostate: Secondary | ICD-10-CM | POA: Insufficient documentation

## 2018-12-23 DIAGNOSIS — Z7982 Long term (current) use of aspirin: Secondary | ICD-10-CM | POA: Insufficient documentation

## 2018-12-23 DIAGNOSIS — R351 Nocturia: Secondary | ICD-10-CM | POA: Diagnosis not present

## 2018-12-23 DIAGNOSIS — Z79899 Other long term (current) drug therapy: Secondary | ICD-10-CM | POA: Diagnosis not present

## 2018-12-23 DIAGNOSIS — K219 Gastro-esophageal reflux disease without esophagitis: Secondary | ICD-10-CM | POA: Insufficient documentation

## 2018-12-23 DIAGNOSIS — Z87442 Personal history of urinary calculi: Secondary | ICD-10-CM | POA: Insufficient documentation

## 2018-12-23 DIAGNOSIS — I10 Essential (primary) hypertension: Secondary | ICD-10-CM | POA: Diagnosis not present

## 2018-12-23 DIAGNOSIS — N201 Calculus of ureter: Secondary | ICD-10-CM | POA: Diagnosis not present

## 2018-12-23 HISTORY — PX: CYSTOSCOPY/URETEROSCOPY/HOLMIUM LASER/STENT PLACEMENT: SHX6546

## 2018-12-23 SURGERY — CYSTOSCOPY/URETEROSCOPY/HOLMIUM LASER/STENT PLACEMENT
Anesthesia: General | Laterality: Right

## 2018-12-23 MED ORDER — ACETAMINOPHEN 160 MG/5ML PO SOLN: 325.0000 mg | ORAL | Status: DC | PRN

## 2018-12-23 MED ORDER — MEPERIDINE HCL 50 MG/ML IJ SOLN: 6.2500 mg | INTRAMUSCULAR | Status: DC | PRN

## 2018-12-23 MED ORDER — FENTANYL CITRATE (PF) 100 MCG/2ML IJ SOLN
INTRAMUSCULAR | Status: DC | PRN
  Administered 2018-12-23: 50 ug via INTRAVENOUS

## 2018-12-23 MED ORDER — PROPOFOL 10 MG/ML IV BOLUS
INTRAVENOUS | Status: AC
  Filled 2018-12-23: qty 20

## 2018-12-23 MED ORDER — ACETAMINOPHEN 325 MG PO TABS: 325.0000 mg | ORAL_TABLET | ORAL | Status: DC | PRN

## 2018-12-23 MED ORDER — ONDANSETRON HCL 4 MG/2ML IJ SOLN: 4.0000 mg | Freq: Once | INTRAMUSCULAR | Status: DC | PRN

## 2018-12-23 MED ORDER — EPHEDRINE 5 MG/ML INJ
INTRAVENOUS | Status: AC
  Filled 2018-12-23: qty 10

## 2018-12-23 MED ORDER — ONDANSETRON HCL 4 MG/2ML IJ SOLN
INTRAMUSCULAR | Status: DC | PRN
  Administered 2018-12-23: 4 mg via INTRAVENOUS

## 2018-12-23 MED ORDER — CEFAZOLIN SODIUM-DEXTROSE 2-4 GM/100ML-% IV SOLN
2.0000 g | INTRAVENOUS | Status: AC
  Administered 2018-12-23: 2 g via INTRAVENOUS
  Filled 2018-12-23: qty 100

## 2018-12-23 MED ORDER — OXYCODONE HCL 5 MG PO TABS: 5.0000 mg | ORAL_TABLET | Freq: Once | ORAL | Status: DC | PRN

## 2018-12-23 MED ORDER — LACTATED RINGERS IV SOLN
INTRAVENOUS | Status: DC
  Administered 2018-12-23: 06:00:00 via INTRAVENOUS

## 2018-12-23 MED ORDER — DEXAMETHASONE SODIUM PHOSPHATE 10 MG/ML IJ SOLN
INTRAMUSCULAR | Status: DC | PRN
  Administered 2018-12-23: 10 mg via INTRAVENOUS

## 2018-12-23 MED ORDER — ONDANSETRON HCL 4 MG/2ML IJ SOLN
INTRAMUSCULAR | Status: AC
  Filled 2018-12-23: qty 2

## 2018-12-23 MED ORDER — LIDOCAINE HCL (CARDIAC) PF 100 MG/5ML IV SOSY
PREFILLED_SYRINGE | INTRAVENOUS | Status: DC | PRN
  Administered 2018-12-23: 100 mg via INTRAVENOUS

## 2018-12-23 MED ORDER — SODIUM CHLORIDE 0.9% FLUSH: 3.0000 mL | Freq: Two times a day (BID) | INTRAVENOUS | Status: DC

## 2018-12-23 MED ORDER — KETOROLAC TROMETHAMINE 15 MG/ML IJ SOLN: 15.0000 mg | Freq: Once | INTRAMUSCULAR | Status: DC

## 2018-12-23 MED ORDER — PROPOFOL 10 MG/ML IV BOLUS
INTRAVENOUS | Status: DC | PRN
  Administered 2018-12-23: 150 mg via INTRAVENOUS

## 2018-12-23 MED ORDER — OXYCODONE HCL 5 MG/5ML PO SOLN: 5.0000 mg | Freq: Once | ORAL | Status: DC | PRN

## 2018-12-23 MED ORDER — SODIUM CHLORIDE 0.9 % IR SOLN
Status: DC | PRN
  Administered 2018-12-23: 6000 mL

## 2018-12-23 MED ORDER — FENTANYL CITRATE (PF) 100 MCG/2ML IJ SOLN
INTRAMUSCULAR | Status: AC
  Filled 2018-12-23: qty 2

## 2018-12-23 MED ORDER — EPHEDRINE SULFATE 50 MG/ML IJ SOLN
INTRAMUSCULAR | Status: DC | PRN
  Administered 2018-12-23: 10 mg via INTRAVENOUS

## 2018-12-23 MED ORDER — FENTANYL CITRATE (PF) 100 MCG/2ML IJ SOLN: 25.0000 ug | INTRAMUSCULAR | Status: DC | PRN

## 2018-12-23 MED ORDER — OXYCODONE HCL 5 MG PO TABS: 5.0000 mg | ORAL_TABLET | Freq: Four times a day (QID) | ORAL | 0 refills | Status: DC | PRN

## 2018-12-23 SURGICAL SUPPLY — 25 items
BAG URO CATCHER STRL LF (MISCELLANEOUS) ×3 IMPLANT
BASKET STONE NCOMPASS (UROLOGICAL SUPPLIES) IMPLANT
CATH ROBINSON RED A/P 16FR (CATHETERS) ×3 IMPLANT
CATH URET 5FR 28IN OPEN ENDED (CATHETERS) IMPLANT
CATH URET DUAL LUMEN 6-10FR 50 (CATHETERS) ×3 IMPLANT
CLOTH BEACON ORANGE TIMEOUT ST (SAFETY) ×3 IMPLANT
COVER WAND RF STERILE (DRAPES) IMPLANT
EXTRACTOR STONE NITINOL NGAGE (UROLOGICAL SUPPLIES) ×3 IMPLANT
FIBER LASER FLEXIVA 1000 (UROLOGICAL SUPPLIES) IMPLANT
FIBER LASER FLEXIVA 365 (UROLOGICAL SUPPLIES) IMPLANT
FIBER LASER FLEXIVA 550 (UROLOGICAL SUPPLIES) IMPLANT
FIBER LASER TRAC TIP (UROLOGICAL SUPPLIES) IMPLANT
GLOVE SURG SS PI 8.0 STRL IVOR (GLOVE) IMPLANT
GOWN STRL REUS W/TWL XL LVL3 (GOWN DISPOSABLE) ×3 IMPLANT
GUIDEWIRE STR DUAL SENSOR (WIRE) ×3 IMPLANT
IV NS 1000ML (IV SOLUTION) ×3
IV NS 1000ML BAXH (IV SOLUTION) ×1 IMPLANT
IV NS IRRIG 3000ML ARTHROMATIC (IV SOLUTION) ×3 IMPLANT
KIT TURNOVER KIT A (KITS) IMPLANT
MANIFOLD NEPTUNE II (INSTRUMENTS) ×3 IMPLANT
PACK CYSTO (CUSTOM PROCEDURE TRAY) ×3 IMPLANT
SHEATH URETERAL 12FRX35CM (MISCELLANEOUS) ×3 IMPLANT
TUBING CONNECTING 10 (TUBING) ×2 IMPLANT
TUBING CONNECTING 10' (TUBING) ×1
TUBING UROLOGY SET (TUBING) ×3 IMPLANT

## 2018-12-23 NOTE — Transfer of Care (Signed)
Immediate Anesthesia Transfer of Care Note  Patient: Alexander Cox  Procedure(s) Performed: CYSTOSCOPY RIGHT RETROGRADE PYELOGRAM URETEROSCOPY (Right )  Patient Location: PACU  Anesthesia Type:General  Level of Consciousness: awake, alert  and oriented  Airway & Oxygen Therapy: Patient Spontanous Breathing and Patient connected to face mask oxygen  Post-op Assessment: Report given to RN and Post -op Vital signs reviewed and stable  Post vital signs: Reviewed and stable  Last Vitals:  Vitals Value Taken Time  BP    Temp    Pulse    Resp    SpO2      Last Pain:  Vitals:   12/23/18 0614  TempSrc:   PainSc: 0-No pain      Patients Stated Pain Goal: 3 (73/22/02 5427)  Complications: No apparent anesthesia complications

## 2018-12-23 NOTE — Op Note (Signed)
Procedure: Ureteroscopy with removal of right distal ureteral stone.  Preop diagnosis: Right distal ureteral stone.  Postop diagnosis: Same.  Surgeon: Dr. Irine Seal.  Anesthesia: General.  Specimen: Stone.  Drain: None.  EBL: None.  Complications: None.  Indications: The patient is a 73 year old white male with a 6 mm right distal ureteral stone that failed to fragment with lithotripsy.  He has elected ureteroscopic management.  Procedure: He was taken the operating room where he received 2 g of Ancef.  A general anesthetic was induced.  He was placed in lithotomy position and fitted with PAS hose.  His perineum and genitalia were prepped with Betadine solution and he was draped in usual sterile fashion.  Cystoscopy was attempted with a 2 [place] scope and 30 degree lens but I was unable to get past the meatus due to stenosis.  I then passed a 4.5 French single-lumen semirigid ureteroscope.  The urethra was unremarkable.  The external sphincter was intact.  The prostatic urethra was approximately 2 to 3 cm in length with bilobar hyperplasia with some coaptation.  Examination of the bladder demonstrated mild trabeculation but no mucosal lesions.  Ureteral orifices were unremarkable.  I was able to negotiate the ureteroscope into the right distal ureter without difficulty and the stone was identified.  The stone was grasped with an engage basket and I was able to remove it intact.  Once the stone had been removed the ureter was reinspected and no significant mucosal trauma was identified so it was not felt that ureteral stenting was indicated.  Ureteroscope was removed and the bladder was drained with a 16 French red rubber catheter.  He was taken down from the lithotomy position, his anesthetic was reversed and he was moved to the recovery room in stable condition.  There were no complications.  He will be given his stone.

## 2018-12-23 NOTE — Discharge Instructions (Signed)
Ureteroscopy, Care After °This sheet gives you information about how to care for yourself after your procedure. Your health care provider may also give you more specific instructions. If you have problems or questions, contact your health care provider. °What can I expect after the procedure? °After the procedure, it is common to have: °· A burning sensation when you urinate. °· Blood in your urine. °· Mild discomfort in the bladder area or kidney area when urinating. °· Needing to urinate more often or urgently. °Follow these instructions at home: ° °Medicines °· Take over-the-counter and prescription medicines only as told by your health care provider. °· If you were prescribed an antibiotic medicine, take it as told by your health care provider. Do not stop taking the antibiotic even if you start to feel better. °General instructions °· Do not drive for 24 hours if you were given a medicine to help you relax (sedative) during your procedure. °· To relieve burning, try taking a warm bath or holding a warm washcloth over your groin. °· Drink enough fluid to keep your urine clear or pale yellow. °? Drink two 8-ounce glasses of water every hour for the first 2 hours after you get home. °? Continue to drink water often at home. °· You can eat what you usually do. °· Keep all follow-up visits as told by your health care provider. This is important. °? If you had a tube placed to keep urine flowing (ureteral stent), ask your health care provider when you need to return to have it removed. °Contact a health care provider if: °· You have chills or a fever. °· You have burning pain for longer than 24 hours after the procedure. °· You have blood in your urine for longer than 24 hours after the procedure. °Get help right away if: °· You have large amounts of blood in your urine. °· You have blood clots in your urine. °· You have very bad pain. °· You have chest pain or trouble breathing. °· You are unable to urinate and you  have the feeling of a full bladder. °This information is not intended to replace advice given to you by your health care provider. Make sure you discuss any questions you have with your health care provider. °Document Released: 05/05/2013 Document Revised: 04/12/2017 Document Reviewed: 02/10/2016 °Elsevier Patient Education © 2020 [employer] ° °

## 2018-12-23 NOTE — Anesthesia Procedure Notes (Signed)
Performed by: Rilee Knoll G, CRNA       

## 2018-12-23 NOTE — Interval H&P Note (Signed)
History and Physical Interval Note:  He has not seen a stone pass.   12/23/2018 7:10 AM  Alexander Cox  has presented today for surgery, with the diagnosis of RIGHT URETERAL CALCULUS.  The various methods of treatment have been discussed with the patient and family. After consideration of risks, benefits and other options for treatment, the patient has consented to  Procedure(s): CYSTOSCOPY RIGHT RETROGRADE PYELOGRAM URETEROSCOPY/HOLMIUM LASER/STENT PLACEMENT (Right) as a surgical intervention.  The patient's history has been reviewed, patient examined, no change in status, stable for surgery.  I have reviewed the patient's chart and labs.  Questions were answered to the patient's satisfaction.     Irine Seal

## 2018-12-23 NOTE — Anesthesia Procedure Notes (Signed)
Procedure Name: LMA Insertion Date/Time: 12/23/2018 7:24 AM Performed by: Glory Buff, CRNA Pre-anesthesia Checklist: Patient identified, Emergency Drugs available, Suction available and Patient being monitored Patient Re-evaluated:Patient Re-evaluated prior to induction Oxygen Delivery Method: Circle system utilized Preoxygenation: Pre-oxygenation with 100% oxygen Induction Type: IV induction LMA: LMA inserted LMA Size: 4.0 Number of attempts: 1 Placement Confirmation: positive ETCO2 Tube secured with: Tape Dental Injury: Teeth and Oropharynx as per pre-operative assessment

## 2018-12-23 NOTE — Anesthesia Postprocedure Evaluation (Signed)
Anesthesia Post Note  Patient: Alexander Cox  Procedure(s) Performed: CYSTOSCOPY RIGHT RETROGRADE PYELOGRAM URETEROSCOPY (Right )     Patient location during evaluation: PACU Anesthesia Type: General Level of consciousness: awake Pain management: pain level controlled Vital Signs Assessment: post-procedure vital signs reviewed and stable Respiratory status: spontaneous breathing Cardiovascular status: stable Postop Assessment: no apparent nausea or vomiting Anesthetic complications: no    Last Vitals:  Vitals:   12/23/18 0800 12/23/18 0815  BP: 123/74 131/75  Pulse: 68 67  Resp: 13 (!) 22  Temp:    SpO2: 100% 100%    Last Pain:  Vitals:   12/23/18 0815  TempSrc:   PainSc: 0-No pain   Pain Goal: Patients Stated Pain Goal: 3 (12/23/18 6761)                 Huston Foley

## 2018-12-24 ENCOUNTER — Encounter (HOSPITAL_COMMUNITY): Payer: Self-pay | Admitting: Urology

## 2019-01-28 NOTE — Progress Notes (Signed)
Cardiology Office Note   Date:  01/30/2019   ID:  Alexander Cox, DOB [birth year], MRN [id number]  PCP:  Mayra Neer, MD    No chief complaint on file.  Bradycardia  Wt Readings from Last 3 Encounters:  01/30/19 176 lb 6.4 oz (80 kg)  12/23/18 176 lb 4 oz (79.9 kg)  12/18/18 176 lb 4 oz (79.9 kg)       History of Present Illness: Alexander Cox is a 73 y.o. male who is being seen today for the evaluation of bradycardia at the request of Mayra Neer, MD.  He was noted to have a low HR when he went to the urologist.  HR was in the 40s.  He has had some fatigue.    He has had some dizziness when going form squatting to standing.    He has had some dizziness when he was dehydrated.    He is also getting hormone therapy for prostate cancer.  Denies : Chest pain.  Leg edema. Nitroglycerin use. Orthopnea. Palpitations. Paroxysmal nocturnal dyspnea.  Syncope.   Past Medical History:  Diagnosis Date  . Acute renal insufficiency 12/01/2013  . Asthma    hx child  . Back injury 2014   Fell off ladder and broke back, BP medication being adjusted and passed out while on ladder  . Celiac disease   . DDD (degenerative disc disease), lumbar   . Dehydration 12/01/2013  . Elevated PSA   . GERD (gastroesophageal reflux disease)   . Grade I diastolic dysfunction   . High cholesterol   . History of kidney stones   . HTN (hypertension) 12/01/2013  . Hyperlipidemia 12/01/2013  . Hypertension   . Hypotension 11/30/2013  . Lethargy due to narcotics 12/14/2013  . Lumbar radiculopathy 01/01/2014  . Malignant neoplasm of prostate ([place]) 10/02/2017  . Multiple fractures of ribs of left side 12/01/2013  . Neuromuscular disorder ([place])    Nerve damage due to history of broken back  . Prostate cancer ([place])   . Pulmonary hypertension (HCC)    Moderate  . Spondylolisthesis at L5-S1 level 09/10/2014  . 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/2014  . Syncope 11/30/2013  . T11 vertebral  fracture ([place]) 11/30/2013  . Traumatic burst fracture of thoracic vertebra ([place]) 12/04/2013  . Wears hearing aid     Past Surgical History:  Procedure Laterality Date  . BACK SURGERY  06  . BIOPSY PROSTATE  08/14/2017  . COLONOSCOPY    . CYSTOSCOPY/RETROGRADE/URETEROSCOPY/STONE EXTRACTION WITH BASKET Right 11/28/2017   Procedure: RIGHT URETEROSCOPY/STONE EXTRACTION WITH BASKET;  Surgeon: Irine Seal, MD;  Location: [facility];  Service: Urology;  Laterality: Right;  . CYSTOSCOPY/URETEROSCOPY/HOLMIUM LASER/STENT PLACEMENT Right 12/23/2018   Procedure: CYSTOSCOPY RIGHT RETROGRADE PYELOGRAM URETEROSCOPY;  Surgeon: Irine Seal, MD;  Location: [facility];  Service: Urology;  Laterality: Right;  . EXTRACORPOREAL SHOCK WAVE LITHOTRIPSY Right 10/10/2017   Procedure: RIGHT EXTRACORPOREAL SHOCK WAVE LITHOTRIPSY (ESWL);  Surgeon: Raynelle Bring, MD;  Location: [facility];  Service: Urology;  Laterality: Right;  . HERNIA REPAIR Left    teen  . TRANSRECTAL ULTRASOUND  08/14/2017     Current Outpatient Medications  Medication Sig Dispense Refill  . aspirin EC 81 MG tablet Take 81 mg by mouth daily.    . calcium carbonate (OS-CAL) 600 MG TABS tablet Take 600 mg by mouth daily with breakfast.    . cholecalciferol (VITAMIN D) 25 MCG (1000 UT) tablet Take 1,000 Units by mouth daily.    . Coenzyme  Q10 (COQ10) 200 MG CAPS Take 200 mg by mouth daily.    Marland Kitchen gabapentin (NEURONTIN) 300 MG capsule Take 300 mg by mouth 4 (four) times daily.    . hydrochlorothiazide (HYDRODIURIL) 25 MG tablet Take 25 mg by mouth daily.     Marland Kitchen lisinopril (ZESTRIL) 10 MG tablet Take 10 mg by mouth daily.    . Multiple Vitamin (MULTIVITAMIN WITH MINERALS) TABS tablet Take 1 tablet by mouth daily.    . Omega-3 Fatty Acids (FISH OIL) 1000 MG CAPS Take 1,000 mg by mouth daily.    . pravastatin (PRAVACHOL) 10 MG tablet Take 10 mg by mouth daily.    . RABEprazole (ACIPHEX) 20 MG tablet Take 20 mg by mouth daily.     . Tetrahydroz-Polyvinyl Al-Povid  (CLEAR EYES TRIPLE ACTION) 0.05-0.5-0.6 % SOLN Place 1 drop into both eyes 3 (three) times daily as needed (dry/irritated eyes.).     No current facility-administered medications for this visit.     Allergies:   Gluten meal    Social History:  The patient  reports that he quit smoking about 51 years ago. His smoking use included cigarettes. He has a 2.50 pack-year smoking history. His smokeless tobacco use includes snuff. He reports current alcohol use. He reports that he does not use drugs.   Family History:  The patient's *family history includes Hypertension in his paternal grandfather.    ROS:  Please see the history of present illness.   Otherwise, review of systems are positive for fatigue.   All other systems are reviewed and negative.    PHYSICAL EXAM: VS:  BP 108/60   Pulse 78   Ht 5\' 7"  (1.702 m)   Wt 176 lb 6.4 oz (80 kg)   SpO2 99%   BMI 27.63 kg/m  , BMI Body mass index is 27.63 kg/m. GEN: Well nourished, well developed, in no acute distress  HEENT: normal  Neck: no JVD, carotid bruits, or masses Cardiac: RRR, premature beats frequently; no murmurs, rubs, or gallops,no edema  Respiratory:  clear to auscultation bilaterally, normal work of breathing GI: soft, nontender, nondistended, + BS MS: no deformity or atrophy  Skin: warm and dry, no rash Neuro:  Strength and sensation are intact Psych: euthymic mood, full affect   EKG:   The ekg ordered today demonstrates NSR, PVCs   Recent Labs: 12/18/2018: BUN 26; Creatinine, Ser 1.34; Hemoglobin 13.5; Platelets 229; Potassium 4.0; Sodium 140   Lipid Panel No results found for: CHOL, TRIG, HDL, CHOLHDL, VLDL, LDLCALC, LDLDIRECT   Other studies Reviewed: Additional studies/ records that were reviewed today with results demonstrating: 2015 showed normal LVEF, moderate pulm HTN.   ASSESSMENT AND PLAN:  1. Bradycardia: I suspect some of his bradycardia is from his frequent premature beats which are noted on ECG.   THis will cause his peripheral pulse rate to be undercounted.  No syncope.  Has some symptoms with change in position that are more related to BP changes and fluid status.  2. DOE: WIll check.  He had known pulm HTN in 2015. 3. Fatigue: I suspect this is in large part related to his hormone therapy for prostate cancer.   Current medicines are reviewed at length with the patient today.  The patient concerns regarding his medicines were addressed.  The following changes have been made:  No change  Labs/ tests ordered today include:   Orders Placed This Encounter  Procedures  . EKG 12-Lead  . ECHOCARDIOGRAM COMPLETE    Recommend  150 minutes/week of aerobic exercise Low fat, low carb, high fiber diet recommended  Disposition:   FU for echo   Signed, Larae Grooms, MD  01/30/2019 10:32 AM    [facility] [address], [place], [place]  [address] Phone: [phone]; Fax: [phone]

## 2019-01-30 ENCOUNTER — Encounter: Payer: Self-pay | Admitting: Interventional Cardiology

## 2019-01-30 ENCOUNTER — Other Ambulatory Visit: Payer: Self-pay

## 2019-01-30 ENCOUNTER — Ambulatory Visit (INDEPENDENT_AMBULATORY_CARE_PROVIDER_SITE_OTHER): Payer: Medicare Other | Admitting: Interventional Cardiology

## 2019-01-30 VITALS — BP 108/60 | HR 78 | Ht 67.0 in | Wt 176.4 lb

## 2019-01-30 DIAGNOSIS — I493 Ventricular premature depolarization: Secondary | ICD-10-CM

## 2019-01-30 DIAGNOSIS — R001 Bradycardia, unspecified: Secondary | ICD-10-CM

## 2019-01-30 DIAGNOSIS — R0602 Shortness of breath: Secondary | ICD-10-CM

## 2019-01-30 DIAGNOSIS — R5382 Chronic fatigue, unspecified: Secondary | ICD-10-CM

## 2019-01-30 NOTE — Patient Instructions (Addendum)
Medication Instructions:  Your physician recommends that you continue on your current medications as directed. Please refer to the Current Medication list given to you today.  If you need a refill on your cardiac medications before your next appointment, please call your pharmacy.   Lab work: None Ordered  If you have labs (blood work) drawn today and your tests are completely normal, you will receive your results only by: Marland Kitchen MyChart Message (if you have MyChart) OR . A paper copy in the mail If you have any lab test that is abnormal or we need to change your treatment, we will call you to review the results.  Testing/Procedures: Your physician has requested that you have an echocardiogram. Echocardiography is a painless test that uses sound waves to create images of your heart. It provides your doctor with information about the size and shape of your heart and how well your heart's chambers and valves are working. This procedure takes approximately one hour. There are no restrictions for this procedure.  Follow-Up: . Base on test results  Any Other Special Instructions Will Be Listed Below (If Applicable).   Echocardiogram An echocardiogram is a procedure that uses painless sound waves (ultrasound) to produce an image of the heart. Images from an echocardiogram can provide important information about:  Signs of coronary artery disease (CAD).  Aneurysm detection. An aneurysm is a weak or damaged part of an artery wall that bulges out from the normal force of blood pumping through the body.  Heart size and shape. Changes in the size or shape of the heart can be associated with certain conditions, including heart failure, aneurysm, and CAD.  Heart muscle function.  Heart valve function.  Signs of a past heart attack.  Fluid buildup around the heart.  Thickening of the heart muscle.  A tumor or infectious growth around the heart valves. Tell a health care provider about:  Any  allergies you have.  All medicines you are taking, including vitamins, herbs, eye drops, creams, and over-the-counter medicines.  Any blood disorders you have.  Any surgeries you have had.  Any medical conditions you have.  Whether you are pregnant or may be pregnant. What are the risks? Generally, this is a safe procedure. However, problems may occur, including:  Allergic reaction to dye (contrast) that may be used during the procedure. What happens before the procedure? No specific preparation is needed. You may eat and drink normally. What happens during the procedure?   An IV tube may be inserted into one of your veins.  You may receive contrast through this tube. A contrast is an injection that improves the quality of the pictures from your heart.  A gel will be applied to your chest.  A wand-like tool (transducer) will be moved over your chest. The gel will help to transmit the sound waves from the transducer.  The sound waves will harmlessly bounce off of your heart to allow the heart images to be captured in real-time motion. The images will be recorded on a computer. The procedure may vary among health care providers and hospitals. What happens after the procedure?  You may return to your normal, everyday life, including diet, activities, and medicines, unless your health care provider tells you not to do that. Summary  An echocardiogram is a procedure that uses painless sound waves (ultrasound) to produce an image of the heart.  Images from an echocardiogram can provide important information about the size and shape of your heart, heart muscle  function, heart valve function, and fluid buildup around your heart.  You do not need to do anything to prepare before this procedure. You may eat and drink normally.  After the echocardiogram is completed, you may return to your normal, everyday life, unless your health care provider tells you not to do that. This  information is not intended to replace advice given to you by your health care provider. Make sure you discuss any questions you have with your health care provider. Document Released: 04/27/2000 Document Revised: 08/21/2018 Document Reviewed: 06/02/2016 Elsevier Patient Education  2020 [employer].

## 2019-02-05 ENCOUNTER — Other Ambulatory Visit: Payer: Self-pay

## 2019-02-05 ENCOUNTER — Ambulatory Visit (HOSPITAL_COMMUNITY): Payer: Medicare Other | Attending: Internal Medicine

## 2019-02-05 DIAGNOSIS — R0602 Shortness of breath: Secondary | ICD-10-CM | POA: Diagnosis present

## 2019-02-05 MED ORDER — PERFLUTREN LIPID MICROSPHERE
1.0000 mL | INTRAVENOUS | Status: AC | PRN
  Administered 2019-02-05: 2 mL via INTRAVENOUS

## 2019-02-18 ENCOUNTER — Ambulatory Visit: Payer: Medicare Other | Admitting: Interventional Cardiology

## 2020-02-17 ENCOUNTER — Encounter: Payer: Self-pay | Admitting: Emergency Medicine

## 2020-02-17 ENCOUNTER — Other Ambulatory Visit: Payer: Self-pay

## 2020-02-17 ENCOUNTER — Ambulatory Visit
Admission: EM | Admit: 2020-02-17 | Discharge: 2020-02-17 | Disposition: A | Discharge status: Home / Self Care | Payer: Medicare Other | Attending: Emergency Medicine | Admitting: Emergency Medicine

## 2020-02-17 DIAGNOSIS — Z1152 Encounter for screening for COVID-19: Secondary | ICD-10-CM

## 2020-02-17 MED ORDER — ALBUTEROL SULFATE HFA 108 (90 BASE) MCG/ACT IN AERS: 1.0000 | INHALATION_SPRAY | Freq: Four times a day (QID) | RESPIRATORY_TRACT | 0 refills | Status: AC | PRN

## 2020-02-17 MED ORDER — BENZONATATE 100 MG PO CAPS: 100.0000 mg | ORAL_CAPSULE | Freq: Three times a day (TID) | ORAL | 0 refills | Status: AC | PRN

## 2020-02-17 MED ORDER — ONDANSETRON HCL 4 MG PO TABS: 4.0000 mg | ORAL_TABLET | Freq: Three times a day (TID) | ORAL | 0 refills | Status: DC | PRN

## 2020-02-17 NOTE — ED Triage Notes (Signed)
Patient arrived with wife today.   Patient c/o COVID exposure after vacation.   Patient c/o malaise.

## 2020-02-17 NOTE — Discharge Instructions (Signed)
Please stay quarantined as much as possible until COVID-19 results return. You have been prescribed an albuterol inhaler to use for shortness of breath. You have been prescribed Tessalon Perles for cough and Zofran for nausea if nausea occurs.

## 2020-02-17 NOTE — ED Provider Notes (Signed)
Emergency Department Provider Note  ____________________________________________  Time seen: Approximately 12:45 PM  I have reviewed the triage vital signs and the nursing notes.   HISTORY  Chief Complaint Covid Exposure   Historian Patient     HPI Alexander Cox is a 74 y.o. male presents to the emergency department with sporadic, nonproductive cough, malaise and headache.  Patient recently traveled to [facility] in one of the people in their group tested positive for COVID-19.  He denies current chest pain, chest tightness or abdominal pain.  Patient would like testing for COVID-19.   Past Medical History:  Diagnosis Date  . Acute renal insufficiency 12/01/2013  . Asthma    hx child  . Back injury 2014   Fell off ladder and broke back, BP medication being adjusted and passed out while on ladder  . Celiac disease   . DDD (degenerative disc disease), lumbar   . Dehydration 12/01/2013  . Elevated PSA   . GERD (gastroesophageal reflux disease)   . Grade I diastolic dysfunction   . High cholesterol   . History of kidney stones   . HTN (hypertension) 12/01/2013  . Hyperlipidemia 12/01/2013  . Hypertension   . Hypotension 11/30/2013  . Lethargy due to narcotics 12/14/2013  . Lumbar radiculopathy 01/01/2014  . Malignant neoplasm of prostate ([place]) 10/02/2017  . Multiple fractures of ribs of left side 12/01/2013  . Neuromuscular disorder ([place])    Nerve damage due to history of broken back  . Prostate cancer ([place])   . Pulmonary hypertension (HCC)    Moderate  . Spondylolisthesis at L5-S1 level 09/10/2014  . 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/2014  . Syncope 11/30/2013  . T11 vertebral fracture ([place]) 11/30/2013  . Traumatic burst fracture of thoracic vertebra ([place]) 12/04/2013  . Wears hearing aid      Immunizations up to date:  Yes.     Past Medical History:  Diagnosis Date  . Acute renal insufficiency 12/01/2013  . Asthma    hx child  . Back injury 2014   Fell  off ladder and broke back, BP medication being adjusted and passed out while on ladder  . Celiac disease   . DDD (degenerative disc disease), lumbar   . Dehydration 12/01/2013  . Elevated PSA   . GERD (gastroesophageal reflux disease)   . Grade I diastolic dysfunction   . High cholesterol   . History of kidney stones   . HTN (hypertension) 12/01/2013  . Hyperlipidemia 12/01/2013  . Hypertension   . Hypotension 11/30/2013  . Lethargy due to narcotics 12/14/2013  . Lumbar radiculopathy 01/01/2014  . Malignant neoplasm of prostate ([place]) 10/02/2017  . Multiple fractures of ribs of left side 12/01/2013  . Neuromuscular disorder ([place])    Nerve damage due to history of broken back  . Prostate cancer ([place])   . Pulmonary hypertension (HCC)    Moderate  . Spondylolisthesis at L5-S1 level 09/10/2014  . 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/2014  . Syncope 11/30/2013  . T11 vertebral fracture ([place]) 11/30/2013  . Traumatic burst fracture of thoracic vertebra ([place]) 12/04/2013  . Wears hearing aid     Patient Active Problem List   Diagnosis Date Noted  . Malignant neoplasm of prostate ([place]) 10/02/2017  . Spondylolisthesis at L5-S1 level 09/10/2014  . Lumbar radiculopathy 01/01/2014  . 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/2014  . Lethargy due to narcotics 12/14/2013  . Traumatic burst fracture of thoracic vertebra ([place]) 12/04/2013  . Dehydration 12/01/2013  . Multiple fractures of  ribs of left side 12/01/2013  . HTN (hypertension) 12/01/2013  . Hyperlipidemia 12/01/2013  . Acute renal insufficiency 12/01/2013  . T11 vertebral fracture ([place]) 11/30/2013  . Hypotension 11/30/2013  . Syncope 11/30/2013    Past Surgical History:  Procedure Laterality Date  . BACK SURGERY  06  . BIOPSY PROSTATE  08/14/2017  . COLONOSCOPY    . CYSTOSCOPY/RETROGRADE/URETEROSCOPY/STONE EXTRACTION WITH BASKET Right 11/28/2017   Procedure: RIGHT URETEROSCOPY/STONE EXTRACTION WITH BASKET;  Surgeon: Irine Seal, MD;   [facility];  Service: Urology;  Laterality: Right;  . CYSTOSCOPY/URETEROSCOPY/HOLMIUM LASER/STENT PLACEMENT Right 12/23/2018   Procedure: CYSTOSCOPY RIGHT RETROGRADE PYELOGRAM URETEROSCOPY;  Surgeon: Irine Seal, MD;  Location[facility];  Service: Urology;  Laterality: Right;  . EXTRACORPOREAL SHOCK WAVE LITHOTRIPSY Right 10/10/2017   Procedure: RIGHT EXTRACORPOREAL SHOCK WAVE LITHOTRIPSY (ESWL);  Surgeon: Raynelle Bring, MD;  Location[facility];  Service: Urology;  Laterality: Right;  . HERNIA REPAIR Left    teen  . TRANSRECTAL ULTRASOUND  08/14/2017    Prior to Admission medications   Medication Sig Start Date End Date Taking? Authorizing Provider  aspirin EC 81 MG tablet Take 81 mg by mouth daily.   Yes [provider]  calcium carbonate (OS-CAL) 600 MG TABS tablet Take 600 mg by mouth daily with breakfast.   Yes [provider]  cholecalciferol (VITAMIN D) 25 MCG (1000 UT) tablet Take 1,000 Units by mouth daily.   Yes [provider]  Coenzyme Q10 (COQ10) 200 MG CAPS Take 200 mg by mouth daily.   Yes [provider]  gabapentin (NEURONTIN) 300 MG capsule Take 300 mg by mouth 4 (four) times daily. 11/25/18  Yes [provider]  hydrochlorothiazide (HYDRODIURIL) 25 MG tablet Take 25 mg by mouth daily.  11/17/13  Yes [provider]  Multiple Vitamin (MULTIVITAMIN WITH MINERALS) TABS tablet Take 1 tablet by mouth daily.   Yes [provider]  Omega-3 Fatty Acids (FISH OIL) 1000 MG CAPS Take 1,000 mg by mouth daily.   Yes [provider]  pravastatin (PRAVACHOL) 10 MG tablet Take 10 mg by mouth daily. 11/23/18  Yes [provider]  Tetrahydroz-Polyvinyl Al-Povid (CLEAR EYES TRIPLE ACTION) 0.05-0.5-0.6 % SOLN Place 1 drop into both eyes 3 (three) times daily as needed (dry/irritated eyes.).   Yes [provider]  albuterol (VENTOLIN HFA) 108 (90 Base) MCG/ACT inhaler Inhale 1-2 puffs into the lungs every 6  (six) hours as needed for wheezing or shortness of breath. 02/17/20   Lannie Fields, PA-C  benzonatate (TESSALON PERLES) 100 MG capsule Take 1 capsule (100 mg total) by mouth 3 (three) times daily as needed for up to 7 days for cough. 02/17/20 02/24/20  Lannie Fields, PA-C  lisinopril (ZESTRIL) 10 MG tablet Take 10 mg by mouth daily. 11/09/18   [provider]  ondansetron (ZOFRAN) 4 MG tablet Take 1 tablet (4 mg total) by mouth every 8 (eight) hours as needed for nausea or vomiting. 02/17/20   Lannie Fields, PA-C  RABEprazole (ACIPHEX) 20 MG tablet Take 20 mg by mouth daily.  07/09/17   [provider]    Allergies Gluten meal  Family History  Problem Relation Age of Onset  . Hypertension Paternal Grandfather     Social History Social History   Tobacco Use  . Smoking status: Former Smoker    Packs/day: 0.50    Years: 5.00    Pack years: 2.50    Types: Cigarettes    Quit date:  05/15/1967    Years since quitting: 52.7  . Smokeless tobacco: Current User    Types: Snuff  Vaping Use  . Vaping Use: Never used  Substance Use Topics  . Alcohol use: Yes    Comment: occasionally  . Drug use: No     Review of Systems  Constitutional: No fever/chills Eyes:  No discharge ENT: Patient has nasal congestion. Respiratory: Patient has sporadic nonproductive cough. Gastrointestinal:   No nausea, no vomiting.  No diarrhea.  No constipation. Musculoskeletal: Negative for musculoskeletal pain. Skin: Negative for rash, abrasions, lacerations, ecchymosis.    ____________________________________________   PHYSICAL EXAM:  VITAL SIGNS: ED Triage Vitals  Enc Vitals Group     BP 02/17/20 1218 128/66     Pulse Rate 02/17/20 1218 (!) 57     Resp 02/17/20 1218 20     Temp 02/17/20 1218 98.3 F (36.8 C)     Temp Source 02/17/20 1218 Oral     SpO2 02/17/20 1218 96 %     Weight 02/17/20 1220 180 lb (81.6 kg)     Height 02/17/20 1220 5\' 7"  (1.702 m)     Head  Circumference --      Peak Flow --      Pain Score 02/17/20 1220 0     Pain Loc --      Pain Edu? --      Excl. in [place]? --      Constitutional: Alert and oriented. Patient is lying supine. Eyes: Conjunctivae are normal. PERRL. EOMI. Head: Atraumatic. ENT:      Ears: Tympanic membranes are mildly injected with mild effusion bilaterally.       Nose: No congestion/rhinnorhea.      Mouth/Throat: Mucous membranes are moist. Posterior pharynx is mildly erythematous.  Hematological/Lymphatic/Immunilogical: No cervical lymphadenopathy.  Cardiovascular: Normal rate, regular rhythm. Normal S1 and S2.  Good peripheral circulation. Respiratory: Normal respiratory effort without tachypnea or retractions. Lungs CTAB. Good air entry to the bases with no decreased or absent breath sounds. Gastrointestinal: Bowel sounds 4 quadrants. Soft and nontender to palpation. No guarding or rigidity. No palpable masses. No distention. No CVA tenderness. Musculoskeletal: Full range of motion to all extremities. No gross deformities appreciated. Neurologic:  Normal speech and language. No gross focal neurologic deficits are appreciated.  Skin:  Skin is warm, dry and intact. No rash noted. Psychiatric: Mood and affect are normal. Speech and behavior are normal. Patient exhibits appropriate insight and judgement.    ____________________________________________   LABS (all labs ordered are listed, but only abnormal results are displayed)  Labs Reviewed  NOVEL CORONAVIRUS, NAA   ____________________________________________  EKG   ____________________________________________  RADIOLOGY   No results found.  ____________________________________________    PROCEDURES  Procedure(s) performed:     Procedures     Medications - No data to display   ____________________________________________   INITIAL IMPRESSION / ASSESSMENT AND PLAN / ED COURSE  Pertinent labs & imaging results that  were available during my care of the patient were reviewed by me and considered in my medical decision making (see chart for details).      Assessment and plan Testing for COVID-39 74 year old male presents to the emergency department with nasal congestion, sporadic cough and malaise after recent travel.  Patient was mildly bradycardic at triage but vital signs were otherwise reassuring.  Sendoff COVID-19 testing is in process at this time.  Patient was discharged with an albuterol inhaler, Zofran and Tessalon Perles.  Rest and hydration were encouraged  at home.  Quarantine precautions were given.  Return precautions were given to return with new or worsening symptoms.    ____________________________________________  FINAL CLINICAL IMPRESSION(S) / ED DIAGNOSES  Final diagnoses:  Encounter for screening for COVID-19      NEW MEDICATIONS STARTED DURING THIS VISIT:  ED Discharge Orders         Ordered    albuterol (VENTOLIN HFA) 108 (90 Base) MCG/ACT inhaler  Every 6 hours PRN        02/17/20 1242    benzonatate (TESSALON PERLES) 100 MG capsule  3 times daily PRN        02/17/20 1242    ondansetron (ZOFRAN) 4 MG tablet  Every 8 hours PRN        02/17/20 1242              This chart was dictated using voice recognition software/Dragon. Despite best efforts to proofread, errors can occur which can change the meaning. Any change was purely unintentional.     Lannie Fields, PA-C 02/17/20 1247

## 2020-02-18 LAB — SARS-COV-2, NAA 2 DAY TAT

## 2020-02-18 LAB — NOVEL CORONAVIRUS, NAA: SARS-CoV-2, NAA: DETECTED — AB

## 2020-02-19 ENCOUNTER — Ambulatory Visit (HOSPITAL_COMMUNITY)
Admission: RE | Admit: 2020-02-19 | Discharge: 2020-02-19 | Disposition: A | Discharge status: Home / Self Care | Payer: Medicare Other | Source: Ambulatory Visit | Attending: Pulmonary Disease | Admitting: Pulmonary Disease

## 2020-02-19 ENCOUNTER — Other Ambulatory Visit: Payer: Self-pay | Admitting: Physician Assistant

## 2020-02-19 DIAGNOSIS — U071 COVID-19: Secondary | ICD-10-CM

## 2020-02-19 DIAGNOSIS — Z6828 Body mass index (BMI) 28.0-28.9, adult: Secondary | ICD-10-CM | POA: Insufficient documentation

## 2020-02-19 DIAGNOSIS — Z23 Encounter for immunization: Secondary | ICD-10-CM | POA: Insufficient documentation

## 2020-02-19 DIAGNOSIS — I10 Essential (primary) hypertension: Secondary | ICD-10-CM | POA: Diagnosis present

## 2020-02-19 DIAGNOSIS — C61 Malignant neoplasm of prostate: Secondary | ICD-10-CM | POA: Insufficient documentation

## 2020-02-19 MED ORDER — FAMOTIDINE IN NACL 20-0.9 MG/50ML-% IV SOLN: 20.0000 mg | Freq: Once | INTRAVENOUS | Status: DC | PRN

## 2020-02-19 MED ORDER — SODIUM CHLORIDE 0.9 % IV SOLN: Freq: Once | INTRAVENOUS | Status: AC

## 2020-02-19 MED ORDER — DIPHENHYDRAMINE HCL 50 MG/ML IJ SOLN: 50.0000 mg | Freq: Once | INTRAMUSCULAR | Status: DC | PRN

## 2020-02-19 MED ORDER — EPINEPHRINE 0.3 MG/0.3ML IJ SOAJ: 0.3000 mg | Freq: Once | INTRAMUSCULAR | Status: DC | PRN

## 2020-02-19 MED ORDER — ALBUTEROL SULFATE HFA 108 (90 BASE) MCG/ACT IN AERS: 2.0000 | INHALATION_SPRAY | Freq: Once | RESPIRATORY_TRACT | Status: DC | PRN

## 2020-02-19 MED ORDER — SODIUM CHLORIDE 0.9 % IV SOLN: INTRAVENOUS | Status: DC | PRN

## 2020-02-19 MED ORDER — METHYLPREDNISOLONE SODIUM SUCC 125 MG IJ SOLR: 125.0000 mg | Freq: Once | INTRAMUSCULAR | Status: DC | PRN

## 2020-02-19 NOTE — Discharge Instructions (Signed)

## 2020-02-19 NOTE — Progress Notes (Signed)
  Diagnosis: COVID-19  Physician: dr Asencion Noble  Procedure: [facility]fusion [facility] Med: casirivimab\imdevimab infusion - Provided patient with casirivimab\imdevimab fact sheet for patients, parents and caregivers prior to infusion.  Complications: No immediate complications noted.  Discharge: Discharged home   Heide Scales 02/19/2020

## 2020-02-19 NOTE — Progress Notes (Signed)
I connected by phone with Alexander Cox on 02/19/2020 at 9:48 AM to discuss the potential use of a new treatment for mild to moderate COVID-19 viral infection in non-hospitalized patients.  This patient is a 74 y.o. male that meets the FDA criteria for Emergency Use Authorization of COVID monoclonal antibody casirivimab/imdevimab.  Has a (+) direct SARS-CoV-2 viral test result  Has mild or moderate COVID-19   Is NOT hospitalized due to COVID-19  Is within 10 days of symptom onset  Has at least one of the high risk factor(s) for progression to severe COVID-19 and/or hospitalization as defined in EUA.  Specific high risk criteria : Older age (>/= 73 yo), BMI > 25, Diabetes, Immunosuppressive Disease or Treatment and Cardiovascular disease or hypertension   I have spoken and communicated the following to the patient or parent/caregiver regarding COVID monoclonal antibody treatment:  1. FDA has authorized the emergency use for the treatment of mild to moderate COVID-19 in adults and pediatric patients with positive results of direct SARS-CoV-2 viral testing who are 26 years of age and older weighing at least 40 kg, and who are at high risk for progressing to severe COVID-19 and/or hospitalization.  2. The significant known and potential risks and benefits of COVID monoclonal antibody, and the extent to which such potential risks and benefits are unknown.  3. Information on available alternative treatments and the risks and benefits of those alternatives, including clinical trials.  4. Patients treated with COVID monoclonal antibody should continue to self-isolate and use infection control measures (e.g., wear mask, isolate, social distance, avoid sharing personal items, clean and disinfect "high touch" surfaces, and frequent handwashing) according to CDC guidelines.   5. The patient or parent/caregiver has the option to accept or refuse COVID monoclonal antibody treatment.  After reviewing  this information with the patient, the patient has agreed to receive one of the available covid 19 monoclonal antibodies and will be provided an appropriate fact sheet prior to infusion.  Sx onset 10/2. Set up for infusion on 10/8 @ 1:30pm. Directions given to [facility]. Pt is aware that insurance will be charged an infusion fee. Pt is fully vaccinated.   [name] 02/19/2020 9:48 AM    2

## 2020-08-18 ENCOUNTER — Other Ambulatory Visit: Payer: Self-pay | Admitting: Family Medicine

## 2020-08-18 DIAGNOSIS — M7989 Other specified soft tissue disorders: Secondary | ICD-10-CM

## 2020-08-18 DIAGNOSIS — M79604 Pain in right leg: Secondary | ICD-10-CM

## 2020-08-19 ENCOUNTER — Ambulatory Visit
Admission: RE | Admit: 2020-08-19 | Discharge: 2020-08-19 | Disposition: A | Discharge status: Home / Self Care | Payer: Medicare Other | Source: Ambulatory Visit | Attending: Family Medicine | Admitting: Family Medicine

## 2020-08-19 DIAGNOSIS — M79604 Pain in right leg: Secondary | ICD-10-CM

## 2020-08-19 DIAGNOSIS — M7989 Other specified soft tissue disorders: Secondary | ICD-10-CM

## 2020-09-29 ENCOUNTER — Other Ambulatory Visit: Payer: Self-pay | Admitting: Family Medicine

## 2020-09-29 DIAGNOSIS — G629 Polyneuropathy, unspecified: Secondary | ICD-10-CM

## 2020-09-29 DIAGNOSIS — M79604 Pain in right leg: Secondary | ICD-10-CM

## 2020-09-29 DIAGNOSIS — I10 Essential (primary) hypertension: Secondary | ICD-10-CM

## 2020-10-13 ENCOUNTER — Ambulatory Visit
Admission: RE | Admit: 2020-10-13 | Discharge: 2020-10-13 | Disposition: A | Discharge status: Home / Self Care | Payer: Medicare Other | Source: Ambulatory Visit | Attending: Family Medicine | Admitting: Family Medicine

## 2020-10-13 DIAGNOSIS — M79604 Pain in right leg: Secondary | ICD-10-CM

## 2020-10-13 DIAGNOSIS — I10 Essential (primary) hypertension: Secondary | ICD-10-CM

## 2020-10-13 DIAGNOSIS — G629 Polyneuropathy, unspecified: Secondary | ICD-10-CM

## 2020-10-17 ENCOUNTER — Ambulatory Visit (INDEPENDENT_AMBULATORY_CARE_PROVIDER_SITE_OTHER): Payer: Medicare Other

## 2020-10-17 ENCOUNTER — Other Ambulatory Visit: Payer: Self-pay

## 2020-10-17 ENCOUNTER — Encounter: Payer: Self-pay | Admitting: Emergency Medicine

## 2020-10-17 ENCOUNTER — Ambulatory Visit
Admission: EM | Admit: 2020-10-17 | Discharge: 2020-10-17 | Disposition: A | Discharge status: Home / Self Care | Payer: Medicare Other | Attending: Emergency Medicine | Admitting: Emergency Medicine

## 2020-10-17 DIAGNOSIS — S61213A Laceration without foreign body of left middle finger without damage to nail, initial encounter: Secondary | ICD-10-CM

## 2020-10-17 DIAGNOSIS — Z23 Encounter for immunization: Secondary | ICD-10-CM | POA: Diagnosis not present

## 2020-10-17 DIAGNOSIS — S62639B Displaced fracture of distal phalanx of unspecified finger, initial encounter for open fracture: Secondary | ICD-10-CM

## 2020-10-17 MED ORDER — TETANUS-DIPHTH-ACELL PERTUSSIS 5-2.5-18.5 LF-MCG/0.5 IM SUSY
0.5000 mL | PREFILLED_SYRINGE | Freq: Once | INTRAMUSCULAR | Status: AC
  Administered 2020-10-17: 0.5 mL via INTRAMUSCULAR

## 2020-10-17 MED ORDER — CEPHALEXIN 500 MG PO CAPS: 500.0000 mg | ORAL_CAPSULE | Freq: Four times a day (QID) | ORAL | 0 refills | Status: AC

## 2020-10-17 NOTE — ED Provider Notes (Signed)
[facility]    CSN: [id number] Arrival date & time: 10/17/20  1401      History   Chief Complaint Chief Complaint  Patient presents with  . Laceration    HPI Alexander Cox is a 75 y.o. male history of hypertension, presenting today for evaluation of left middle finger laceration.  Sustained laceration to left middle finger with a saw earlier today.  Denies use of blood thinners.  Denies difficulty bending finger.  Reports tetanus greater than 5 years.  HPI  Past Medical History:  Diagnosis Date  . Acute renal insufficiency 12/01/2013  . Asthma    hx child  . Back injury 2014   Fell off ladder and broke back, BP medication being adjusted and passed out while on ladder  . Celiac disease   . DDD (degenerative disc disease), lumbar   . Dehydration 12/01/2013  . Elevated PSA   . GERD (gastroesophageal reflux disease)   . Grade I diastolic dysfunction   . High cholesterol   . History of kidney stones   . HTN (hypertension) 12/01/2013  . Hyperlipidemia 12/01/2013  . Hypertension   . Hypotension 11/30/2013  . Lethargy due to narcotics 12/14/2013  . Lumbar radiculopathy 01/01/2014  . Malignant neoplasm of prostate ([place]) 10/02/2017  . Multiple fractures of ribs of left side 12/01/2013  . Neuromuscular disorder ([place])    Nerve damage due to history of broken back  . Prostate cancer ([place])   . Pulmonary hypertension (H[facility])    Moderate  . Spondylolisthesis at L5-S1 level 09/10/2014  . 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/2014  . Syncope 11/30/2013  . T11 vertebral fracture ([place]) 11/30/2013  . Traumatic burst fracture of thoracic vertebra ([place]) 12/04/2013  . Wears hearing aid     Patient Active Problem List   Diagnosis Date Noted  . Malignant neoplasm of prostate ([place]) 10/02/2017  . Spondylolisthesis at L5-S1 level 09/10/2014  . Lumbar radiculopathy 01/01/2014  . 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/2014  . Lethargy due to narcotics 12/14/2013  . Traumatic burst  fracture of thoracic vertebra ([place]) 12/04/2013  . Dehydration 12/01/2013  . Multiple fractures of ribs of left side 12/01/2013  . HTN (hypertension) 12/01/2013  . Hyperlipidemia 12/01/2013  . Acute renal insufficiency 12/01/2013  . T11 vertebral fracture ([place]) 11/30/2013  . Hypotension 11/30/2013  . Syncope 11/30/2013    Past Surgical History:  Procedure Laterality Date  . BACK SURGERY  06  . BIOPSY PROSTATE  08/14/2017  . COLONOSCOPY    . CYSTOSCOPY/RETROGRADE/URETEROSCOPY/STONE EXTRACTION WITH BASKET Right 11/28/2017   Procedure: RIGHT URETEROSCOPY/STONE EXTRACTION WITH BASKET;  Surgeon: Irine Seal, MD;  Location: [facility];  Service: Urology;  Laterality: Right;  . CYSTOSCOPY/URETEROSCOPY/HOLMIUM LASER/STENT PLACEMENT Right 12/23/2018   Procedure: CYSTOSCOPY RIGHT RETROGRADE PYELOGRAM URETEROSCOPY;  Surgeon: Irine Seal, MD;  Location: [facility];  Service: Urology;  Laterality: Right;  . EXTRACORPOREAL SHOCK WAVE LITHOTRIPSY Right 10/10/2017   Procedure: RIGHT EXTRACORPOREAL SHOCK WAVE LITHOTRIPSY (ESWL);  Surgeon: Raynelle Bring, MD;  Location: [facility];  Service: Urology;  Laterality: Right;  . HERNIA REPAIR Left    [age]  . TRANSRECTAL ULTRASOUND  08/14/2017       Home Medications    Prior to Admission medications   Medication Sig Start Date End Date Taking? Authorizing Provider  cephALEXin (KEFLEX) 500 MG capsule Take 1 capsule (500 mg total) by mouth 4 (four) times daily for 5 days. 10/17/20 10/22/20 Yes Francys Bolin C, PA-C  albuterol (VENTOLIN HFA) 108 (90 Base) MCG/ACT inhaler  Inhale 1-2 puffs into the lungs every 6 (six) hours as needed for wheezing or shortness of breath. 02/17/20   Lannie Fields, PA-C  aspirin EC 81 MG tablet Take 81 mg by mouth daily.    [provider]  calcium carbonate (OS-CAL) 600 MG TABS tablet Take 600 mg by mouth daily with breakfast.    [provider]  cholecalciferol (VITAMIN D) 25 MCG (1000 UT) tablet Take 1,000 Units by mouth  daily.    [provider]  Coenzyme Q10 (COQ10) 200 MG CAPS Take 200 mg by mouth daily.    [provider]  gabapentin (NEURONTIN) 300 MG capsule Take 300 mg by mouth 4 (four) times daily. 11/25/18   [provider]  hydrochlorothiazide (HYDRODIURIL) 25 MG tablet Take 25 mg by mouth daily.  11/17/13   [provider]  lisinopril (ZESTRIL) 10 MG tablet Take 10 mg by mouth daily. 11/09/18   [provider]  Multiple Vitamin (MULTIVITAMIN WITH MINERALS) TABS tablet Take 1 tablet by mouth daily.    [provider]  Omega-3 Fatty Acids (FISH OIL) 1000 MG CAPS Take 1,000 mg by mouth daily.    [provider]  ondansetron (ZOFRAN) 4 MG tablet Take 1 tablet (4 mg total) by mouth every 8 (eight) hours as needed for nausea or vomiting. 02/17/20   Lannie Fields, PA-C  pravastatin (PRAVACHOL) 10 MG tablet Take 10 mg by mouth daily. 11/23/18   [provider]  RABEprazole (ACIPHEX) 20 MG tablet Take 20 mg by mouth daily.  07/09/17   [provider]  Tetrahydroz-Polyvinyl Al-Povid (CLEAR EYES TRIPLE ACTION) 0.05-0.5-0.6 % SOLN Place 1 drop into both eyes 3 (three) times daily as needed (dry/irritated eyes.).    [provider]    Family History Family History  Problem Relation Age of Onset  . Hypertension Paternal Grandfather     Social History Social History   Tobacco Use  . Smoking status: Former Smoker    Packs/day: 0.50    Years: 5.00    Pack years: 2.50    Types: Cigarettes    Quit date: 05/15/1967    Years since quitting: 53.4  . Smokeless tobacco: Current User    Types: Snuff  Vaping Use  . Vaping Use: Never used  Substance Use Topics  . Alcohol use: Yes    Comment: occasionally  . Drug use: No     Allergies   Gluten meal   Review of Systems Review of Systems  Constitutional: Negative for fatigue and fever.  Eyes: Negative for redness, itching and visual disturbance.  Respiratory: Negative for  shortness of breath.   Cardiovascular: Negative for chest pain and leg swelling.  Gastrointestinal: Negative for nausea and vomiting.  Musculoskeletal: Negative for arthralgias and myalgias.  Skin: Positive for wound. Negative for color change and rash.  Neurological: Negative for dizziness, syncope, weakness, light-headedness and headaches.     Physical Exam Triage Vital Signs ED Triage Vitals  Enc Vitals Group     BP 10/17/20 1615 (!) 148/79     Pulse Rate 10/17/20 1615 75     Resp 10/17/20 1615 18     Temp 10/17/20 1615 97.8 F (36.6 C)     Temp Source 10/17/20 1615 Oral     SpO2 10/17/20 1615 97 %     Weight --      Height --      Head Circumference --      Peak Flow --  Pain Score 10/17/20 1616 7     Pain Loc --      Pain Edu? --      Excl. in [place]? --    No data found.  Updated Vital Signs BP (!) 148/79 (BP Location: Right Arm)   Pulse 75   Temp 97.8 F (36.6 C) (Oral)   Resp 18   SpO2 97%   Visual Acuity Right Eye Distance:   Left Eye Distance:   Bilateral Distance:    Right Eye Near:   Left Eye Near:    Bilateral Near:     Physical Exam Vitals and nursing note reviewed.  Constitutional:      Appearance: He is well-developed.     Comments: No acute distress  HENT:     Head: Normocephalic and atraumatic.     Nose: Nose normal.  Eyes:     Conjunctiva/sclera: Conjunctivae normal.  Cardiovascular:     Rate and Rhythm: Normal rate.  Pulmonary:     Effort: Pulmonary effort is normal. No respiratory distress.  Abdominal:     General: There is no distension.  Musculoskeletal:        General: Normal range of motion.     Cervical back: Neck supple.     Comments: Full active range of motion at DIP and PIP with isolation  Skin:    General: Skin is warm and dry.     Comments: Left middle finger with laceration noted to the distal finger pad, appears jagged and approximately Y-shaped to areas measuring approximately 1 cm each, additional area measuring  0.5 cm  Significant subcutaneous tissue exposed  Neurological:     Mental Status: He is alert and oriented to person, place, and time.      UC Treatments / Results  Labs (all labs ordered are listed, but only abnormal results are displayed) Labs Reviewed - No data to display  EKG   Radiology DG Finger Middle Left  Result Date: 10/17/2020 CLINICAL DATA:  Left long finger laceration with a saw. EXAM: LEFT MIDDLE FINGER 2+V COMPARISON:  None. FINDINGS: Small fracture of the third distal phalanx tuft with overlying soft tissue irregularity. No radiopaque foreign body identified. IMPRESSION: 1. Small third distal phalanx tuft fracture with overlying soft tissue laceration. Electronically Signed   By: Titus Dubin M.D.   On: 10/17/2020 16:42    Procedures Laceration Repair  Date/Time: 10/17/2020 7:41 PM Performed by: Zalia Hautala, [place] C, PA-C Authorized by: Niyam Bisping, [place] C, PA-C   Consent:    Consent obtained:  Verbal   Consent given by:  Patient   Risks, benefits, and alternatives were discussed: yes     Risks discussed:  Pain, infection and poor cosmetic result   Alternatives discussed:  No treatment Universal protocol:    Patient identity confirmed:  Verbally with patient Anesthesia:    Anesthesia method:  Local infiltration   Local anesthetic:  Lidocaine 2% w/o epi Laceration details:    Location:  Finger   Finger location:  L long finger   Length (cm):  2.5 Pre-procedure details:    Preparation:  Patient was prepped and draped in usual sterile fashion Exploration:    Hemostasis achieved with:  Direct pressure   Imaging outcome: foreign body not noted     Wound exploration: wound explored through full range of motion     Wound extent: underlying fracture     Wound extent: no foreign bodies/material noted     Contaminated: no   Treatment:  Area cleansed with:  Shur-Clens   Amount of cleaning:  Standard   Irrigation solution:  Sterile water   Irrigation method:   Syringe   Visualized foreign bodies/material removed: no     Debridement:  None Skin repair:    Repair method:  Sutures   Suture size:  4-0   Suture material:  Prolene   Suture technique:  Simple interrupted   Number of sutures:  11 Approximation:    Approximation:  Loose Repair type:    Repair type:  Simple Post-procedure details:    Dressing:  Non-adherent dressing   Procedure completion:  Tolerated well, no immediate complications   (including critical care time)  Medications Ordered in UC Medications  Tdap (BOOSTRIX) injection 0.5 mL (0.5 mLs Intramuscular Given 10/17/20 1744)    Initial Impression / Assessment and Plan / UC Course  I have reviewed the triage vital signs and the nursing notes.  Pertinent labs & imaging results that were available during my care of the patient were reviewed by me and considered in my medical decision making (see chart for details).     Laceration repaired, underlying distal tuft fracture, placing on antibiotics, updating tetanus, discussed wound care, sutures to be removed in approximately 10 days.  Low suspicion of underlying tendon injury at this time.  Discussed strict return precautions. Patient verbalized understanding and is agreeable with plan.  Final Clinical Impressions(s) / UC Diagnoses   Final diagnoses:  Laceration of left middle finger without foreign body without damage to nail, initial encounter  Open fracture of tuft of distal phalanx of finger     Discharge Instructions     WOUND CARE Please return in 10 days to have your stitches/staples removed or sooner if you have concerns. Marland Kitchen Keep area clean and dry for 24 hours. Do not remove bandage, if applied. . After 24 hours, remove bandage and wash wound gently with mild soap and warm water. Reapply a new bandage after cleaning wound, if directed. . Continue daily cleansing with soap and water until stitches/staples are removed. . Do not apply any ointments or creams to  the wound while stitches/staples are in place, as this may cause delayed healing. . Notify the office if you experience any of the following signs of infection: Swelling, redness, pus drainage, streaking, fever >101.0 F . Notify the office if you experience excessive bleeding that does not stop after 15-20 minutes of constant, firm pressure.    ED Prescriptions    Medication Sig Dispense Auth. Provider   cephALEXin (KEFLEX) 500 MG capsule Take 1 capsule (500 mg total) by mouth 4 (four) times daily for 5 days. 20 capsule Krisa Blattner, [place] C, PA-C     PDMP not reviewed this encounter.   Janith Lima, [place] 10/17/20 1943

## 2020-10-17 NOTE — ED Triage Notes (Signed)
Pt here for laceration to left middle finger with skill saw today; bleeding controlled

## 2020-10-17 NOTE — Discharge Instructions (Signed)

## 2020-10-28 ENCOUNTER — Ambulatory Visit
Admission: RE | Admit: 2020-10-28 | Discharge: 2020-10-28 | Disposition: A | Discharge status: Home / Self Care | Payer: Medicare Other | Source: Ambulatory Visit | Attending: Family Medicine | Admitting: Family Medicine

## 2020-10-28 ENCOUNTER — Other Ambulatory Visit: Payer: Self-pay

## 2020-10-28 DIAGNOSIS — Z4802 Encounter for removal of sutures: Secondary | ICD-10-CM

## 2020-10-28 NOTE — ED Triage Notes (Signed)
Pt came in to have 11 suture removed. Pt tolerated well.

## 2021-04-11 ENCOUNTER — Other Ambulatory Visit: Payer: Self-pay

## 2021-04-11 ENCOUNTER — Other Ambulatory Visit: Payer: Self-pay | Admitting: Family Medicine

## 2021-04-11 ENCOUNTER — Ambulatory Visit
Admission: RE | Admit: 2021-04-11 | Discharge: 2021-04-11 | Disposition: A | Discharge status: Home / Self Care | Payer: Medicare Other | Source: Ambulatory Visit | Attending: Family Medicine | Admitting: Family Medicine

## 2021-04-11 DIAGNOSIS — R062 Wheezing: Secondary | ICD-10-CM

## 2021-04-11 DIAGNOSIS — R058 Other specified cough: Secondary | ICD-10-CM

## 2021-06-06 ENCOUNTER — Other Ambulatory Visit: Payer: Self-pay | Admitting: Student in an Organized Health Care Education/Training Program

## 2021-06-06 DIAGNOSIS — M5416 Radiculopathy, lumbar region: Secondary | ICD-10-CM

## 2021-06-12 ENCOUNTER — Other Ambulatory Visit: Payer: Self-pay

## 2021-06-12 ENCOUNTER — Ambulatory Visit
Admission: RE | Admit: 2021-06-12 | Discharge: 2021-06-12 | Disposition: A | Discharge status: Home / Self Care | Payer: Non-veteran care | Source: Ambulatory Visit | Attending: Student in an Organized Health Care Education/Training Program | Admitting: Student in an Organized Health Care Education/Training Program

## 2021-06-12 DIAGNOSIS — M5416 Radiculopathy, lumbar region: Secondary | ICD-10-CM

## 2021-10-01 ENCOUNTER — Emergency Department (HOSPITAL_COMMUNITY): Payer: No Typology Code available for payment source

## 2021-10-01 ENCOUNTER — Other Ambulatory Visit: Payer: Self-pay

## 2021-10-01 ENCOUNTER — Encounter (HOSPITAL_COMMUNITY): Payer: Self-pay | Admitting: *Deleted

## 2021-10-01 ENCOUNTER — Observation Stay (HOSPITAL_COMMUNITY)
Admission: EM | Admit: 2021-10-01 | Discharge: 2021-10-02 | Disposition: A | Discharge status: Home / Self Care | Payer: No Typology Code available for payment source | Attending: Family Medicine | Admitting: Family Medicine

## 2021-10-01 DIAGNOSIS — R509 Fever, unspecified: Secondary | ICD-10-CM | POA: Diagnosis not present

## 2021-10-01 DIAGNOSIS — W57XXXA Bitten or stung by nonvenomous insect and other nonvenomous arthropods, initial encounter: Secondary | ICD-10-CM | POA: Insufficient documentation

## 2021-10-01 DIAGNOSIS — R262 Difficulty in walking, not elsewhere classified: Secondary | ICD-10-CM

## 2021-10-01 DIAGNOSIS — Z79899 Other long term (current) drug therapy: Secondary | ICD-10-CM | POA: Insufficient documentation

## 2021-10-01 DIAGNOSIS — R2689 Other abnormalities of gait and mobility: Principal | ICD-10-CM | POA: Insufficient documentation

## 2021-10-01 DIAGNOSIS — K5289 Other specified noninfective gastroenteritis and colitis: Secondary | ICD-10-CM | POA: Diagnosis not present

## 2021-10-01 DIAGNOSIS — Z87891 Personal history of nicotine dependence: Secondary | ICD-10-CM | POA: Diagnosis not present

## 2021-10-01 DIAGNOSIS — I5189 Other ill-defined heart diseases: Secondary | ICD-10-CM | POA: Diagnosis not present

## 2021-10-01 DIAGNOSIS — I10 Essential (primary) hypertension: Secondary | ICD-10-CM | POA: Diagnosis present

## 2021-10-01 DIAGNOSIS — I129 Hypertensive chronic kidney disease with stage 1 through stage 4 chronic kidney disease, or unspecified chronic kidney disease: Secondary | ICD-10-CM | POA: Insufficient documentation

## 2021-10-01 DIAGNOSIS — Z7982 Long term (current) use of aspirin: Secondary | ICD-10-CM | POA: Diagnosis not present

## 2021-10-01 DIAGNOSIS — M79604 Pain in right leg: Secondary | ICD-10-CM | POA: Diagnosis present

## 2021-10-01 DIAGNOSIS — J4520 Mild intermittent asthma, uncomplicated: Secondary | ICD-10-CM | POA: Diagnosis not present

## 2021-10-01 DIAGNOSIS — N1831 Chronic kidney disease, stage 3a: Secondary | ICD-10-CM | POA: Insufficient documentation

## 2021-10-01 DIAGNOSIS — Z20822 Contact with and (suspected) exposure to covid-19: Secondary | ICD-10-CM | POA: Insufficient documentation

## 2021-10-01 DIAGNOSIS — M549 Dorsalgia, unspecified: Secondary | ICD-10-CM | POA: Diagnosis not present

## 2021-10-01 DIAGNOSIS — M5416 Radiculopathy, lumbar region: Secondary | ICD-10-CM | POA: Diagnosis present

## 2021-10-01 DIAGNOSIS — Z8546 Personal history of malignant neoplasm of prostate: Secondary | ICD-10-CM | POA: Insufficient documentation

## 2021-10-01 LAB — CBC WITH DIFFERENTIAL/PLATELET
Abs Immature Granulocytes: 0.03 10*3/uL (ref 0.00–0.07)
Basophils Absolute: 0 10*3/uL (ref 0.0–0.1)
Basophils Relative: 0 %
Eosinophils Absolute: 0.1 10*3/uL (ref 0.0–0.5)
Eosinophils Relative: 2 %
HCT: 46.3 % (ref 39.0–52.0)
Hemoglobin: 14.7 g/dL (ref 13.0–17.0)
Immature Granulocytes: 1 %
Lymphocytes Relative: 12 %
Lymphs Abs: 0.8 10*3/uL (ref 0.7–4.0)
MCH: 29.5 pg (ref 26.0–34.0)
MCHC: 31.7 g/dL (ref 30.0–36.0)
MCV: 92.8 fL (ref 80.0–100.0)
Monocytes Absolute: 0.1 10*3/uL (ref 0.1–1.0)
Monocytes Relative: 2 %
Neutro Abs: 5.4 10*3/uL (ref 1.7–7.7)
Neutrophils Relative %: 83 %
Platelets: 244 10*3/uL (ref 150–400)
RBC: 4.99 MIL/uL (ref 4.22–5.81)
RDW: 12.7 % (ref 11.5–15.5)
WBC: 6.4 10*3/uL (ref 4.0–10.5)
nRBC: 0 % (ref 0.0–0.2)

## 2021-10-01 LAB — PROCALCITONIN: Procalcitonin: 0.93 ng/mL

## 2021-10-01 LAB — COMPREHENSIVE METABOLIC PANEL
ALT: 19 U/L (ref 0–44)
AST: 33 U/L (ref 15–41)
Albumin: 3.9 g/dL (ref 3.5–5.0)
Alkaline Phosphatase: 107 U/L (ref 38–126)
Anion gap: 11 (ref 5–15)
BUN: 22 mg/dL (ref 8–23)
CO2: 23 mmol/L (ref 22–32)
Calcium: 8.7 mg/dL — ABNORMAL LOW (ref 8.9–10.3)
Chloride: 106 mmol/L (ref 98–111)
Creatinine, Ser: 1.27 mg/dL — ABNORMAL HIGH (ref 0.61–1.24)
GFR, Estimated: 59 mL/min — ABNORMAL LOW (ref >60)
Glucose, Bld: 98 mg/dL (ref 70–99)
Potassium: 3.8 mmol/L (ref 3.5–5.1)
Sodium: 140 mmol/L (ref 135–145)
Total Bilirubin: 1 mg/dL (ref 0.3–1.2)
Total Protein: 7.2 g/dL (ref 6.5–8.1)

## 2021-10-01 LAB — SEDIMENTATION RATE: Sed Rate: 5 mm/hr (ref 0–16)

## 2021-10-01 LAB — URINALYSIS, ROUTINE W REFLEX MICROSCOPIC
Bilirubin Urine: NEGATIVE
Glucose, UA: NEGATIVE mg/dL
Hgb urine dipstick: NEGATIVE
Ketones, ur: NEGATIVE mg/dL
Leukocytes,Ua: NEGATIVE
Nitrite: NEGATIVE
Protein, ur: NEGATIVE mg/dL
Specific Gravity, Urine: 1.024 (ref 1.005–1.030)
pH: 5 (ref 5.0–8.0)

## 2021-10-01 LAB — LACTIC ACID, PLASMA
Lactic Acid, Venous: 1.8 mmol/L (ref 0.5–1.9)
Lactic Acid, Venous: 1.1 mmol/L (ref 0.5–1.9)

## 2021-10-01 LAB — RESP PANEL BY RT-PCR (FLU A&B, COVID) ARPGX2
Influenza A by PCR: NEGATIVE
Influenza B by PCR: NEGATIVE
SARS Coronavirus 2 by RT PCR: NEGATIVE

## 2021-10-01 LAB — C-REACTIVE PROTEIN: CRP: 1.2 mg/dL — ABNORMAL HIGH (ref <1.0)

## 2021-10-01 MED ORDER — PANTOPRAZOLE SODIUM 40 MG PO TBEC
40.0000 mg | DELAYED_RELEASE_TABLET | Freq: Every day | ORAL | Status: DC
  Administered 2021-10-01 – 2021-10-02 (×2): 40 mg via ORAL
  Filled 2021-10-01 (×2): qty 1

## 2021-10-01 MED ORDER — HYDROCHLOROTHIAZIDE 25 MG PO TABS
25.0000 mg | ORAL_TABLET | Freq: Every day | ORAL | Status: DC
  Administered 2021-10-02: 25 mg via ORAL
  Filled 2021-10-01: qty 1

## 2021-10-01 MED ORDER — KETOROLAC TROMETHAMINE 30 MG/ML IJ SOLN
15.0000 mg | Freq: Once | INTRAMUSCULAR | Status: AC
  Administered 2021-10-01: 15 mg via INTRAVENOUS

## 2021-10-01 MED ORDER — PRAVASTATIN SODIUM 10 MG PO TABS
10.0000 mg | ORAL_TABLET | Freq: Every day | ORAL | Status: DC
  Administered 2021-10-02: 10 mg via ORAL
  Filled 2021-10-01: qty 1

## 2021-10-01 MED ORDER — HYDROMORPHONE HCL 1 MG/ML IJ SOLN
0.5000 mg | INTRAMUSCULAR | Status: DC | PRN
Start: 2021-10-01 — End: 2021-10-02

## 2021-10-01 MED ORDER — KETOROLAC TROMETHAMINE 15 MG/ML IJ SOLN
15.0000 mg | Freq: Once | INTRAMUSCULAR | Status: DC
  Filled 2021-10-01: qty 1

## 2021-10-01 MED ORDER — TETRAHYDROZ-POLYVINYL AL-POVID 0.05-0.5-0.6 % OP SOLN: 1.0000 [drp] | Freq: Three times a day (TID) | OPHTHALMIC | Status: DC | PRN

## 2021-10-01 MED ORDER — SODIUM CHLORIDE 0.9 % IV BOLUS
500.0000 mL | Freq: Once | INTRAVENOUS | Status: AC
  Administered 2021-10-01: 500 mL via INTRAVENOUS

## 2021-10-01 MED ORDER — BISACODYL 5 MG PO TBEC: 5.0000 mg | DELAYED_RELEASE_TABLET | Freq: Every day | ORAL | Status: DC | PRN

## 2021-10-01 MED ORDER — OXYCODONE-ACETAMINOPHEN 5-325 MG PO TABS
1.0000 | ORAL_TABLET | Freq: Once | ORAL | Status: AC
  Administered 2021-10-01: 1 via ORAL
  Filled 2021-10-01: qty 1

## 2021-10-01 MED ORDER — CALCIUM CARBONATE 1250 (500 CA) MG PO TABS
500.0000 mg | ORAL_TABLET | Freq: Every day | ORAL | Status: DC
  Administered 2021-10-02: 500 mg via ORAL
  Filled 2021-10-01 (×2): qty 1

## 2021-10-01 MED ORDER — NAPHAZOLINE-GLYCERIN 0.012-0.25 % OP SOLN
1.0000 [drp] | Freq: Four times a day (QID) | OPHTHALMIC | Status: DC | PRN
  Filled 2021-10-01: qty 15

## 2021-10-01 MED ORDER — SENNOSIDES-DOCUSATE SODIUM 8.6-50 MG PO TABS: 1.0000 | ORAL_TABLET | Freq: Every evening | ORAL | Status: DC | PRN

## 2021-10-01 MED ORDER — COQ10 200 MG PO CAPS: 200.0000 mg | ORAL_CAPSULE | Freq: Every day | ORAL | Status: DC

## 2021-10-01 MED ORDER — ONDANSETRON HCL 4 MG PO TABS: 4.0000 mg | ORAL_TABLET | Freq: Three times a day (TID) | ORAL | Status: DC | PRN

## 2021-10-01 MED ORDER — ASPIRIN 81 MG PO TBEC
81.0000 mg | DELAYED_RELEASE_TABLET | Freq: Every day | ORAL | Status: DC
  Administered 2021-10-01 – 2021-10-02 (×2): 81 mg via ORAL
  Filled 2021-10-01 (×2): qty 1

## 2021-10-01 MED ORDER — ALBUTEROL SULFATE HFA 108 (90 BASE) MCG/ACT IN AERS: 1.0000 | INHALATION_SPRAY | Freq: Four times a day (QID) | RESPIRATORY_TRACT | Status: DC | PRN

## 2021-10-01 MED ORDER — LISINOPRIL 10 MG PO TABS
10.0000 mg | ORAL_TABLET | Freq: Every day | ORAL | Status: DC
  Administered 2021-10-02: 10 mg via ORAL
  Filled 2021-10-01: qty 1

## 2021-10-01 MED ORDER — ENOXAPARIN SODIUM 40 MG/0.4ML IJ SOSY
40.0000 mg | PREFILLED_SYRINGE | INTRAMUSCULAR | Status: DC
  Administered 2021-10-01: 40 mg via SUBCUTANEOUS
  Filled 2021-10-01: qty 0.4

## 2021-10-01 MED ORDER — GABAPENTIN 300 MG PO CAPS
300.0000 mg | ORAL_CAPSULE | Freq: Four times a day (QID) | ORAL | Status: DC
  Administered 2021-10-01 – 2021-10-02 (×2): 300 mg via ORAL
  Filled 2021-10-01 (×2): qty 1

## 2021-10-01 MED ORDER — HYDROCODONE-ACETAMINOPHEN 5-325 MG PO TABS
1.0000 | ORAL_TABLET | ORAL | Status: DC | PRN
  Administered 2021-10-01: 2 via ORAL
  Filled 2021-10-01: qty 2

## 2021-10-01 MED ORDER — CALCIUM CARBONATE 1250 (500 CA) MG PO TABS
600.0000 mg | ORAL_TABLET | Freq: Every day | ORAL | Status: DC
  Filled 2021-10-01: qty 0.5

## 2021-10-01 MED ORDER — ACETAMINOPHEN 325 MG PO TABS: 650.0000 mg | ORAL_TABLET | Freq: Four times a day (QID) | ORAL | Status: DC | PRN

## 2021-10-01 MED ORDER — ACETAMINOPHEN 650 MG RE SUPP: 650.0000 mg | Freq: Four times a day (QID) | RECTAL | Status: DC | PRN

## 2021-10-01 MED ORDER — ALBUTEROL SULFATE (2.5 MG/3ML) 0.083% IN NEBU: 2.5000 mg | INHALATION_SOLUTION | Freq: Four times a day (QID) | RESPIRATORY_TRACT | Status: DC | PRN

## 2021-10-01 MED ORDER — GADOBUTROL 1 MMOL/ML IV SOLN
7.5000 mL | Freq: Once | INTRAVENOUS | Status: AC | PRN
  Administered 2021-10-01: 7.5 mL via INTRAVENOUS

## 2021-10-01 NOTE — ED Notes (Signed)
NT stated pt was shaking.  Temp now 100.5.  MD notified.  Pt c/o diarrhea this am and neck pain.

## 2021-10-01 NOTE — ED Triage Notes (Signed)
Pt here via [facility].  Back surgery approx 2 months prior.  Experienced buttock pain in the night, was on the floor stretching and could not get up d/t the pain.  AO X 4.

## 2021-10-01 NOTE — H&P (Signed)
History and Physical    Alexander Cox YQM:[id number] DOB: [birth year] DOA: 10/01/2021  PCP: Mayra Neer, MD (Confirm with patient/family/NH records and if not entered, this has to be entered at [facility] point of entry) Patient coming from: Home  I have personally briefly reviewed patient's old medical records in [place]  Chief Complaint: Leg pain, can not walk  HPI: Alexander Cox is a 76 y.o. male with medical history significant of lumbar radiculopathy status post L3-S1 PSF/TLIF/laminectomy March 2023, HTN, HLD, mild intermittent asthma, presented with fall, worsening of right leg pain, new onset diarrhea and low fever.  Patient started have a diarrhea loose movement 3 times this morning, no abdominal pain.  He also had low-grade subjective fever at home.  Denies any nauseous vomiting abdominal pain.  His wife had similar symptoms 3 days ago which resolved.  This morning, patient felt new onset of right leg pain shooting down from right buttock to lateral side of the right foot, shooting leg, no urinary problems.  No back pain.  He tried to stretch to the right leg hoping for improved shooting down pain however became unsteady and fell down on the right hip.  He tried to stand up himself but could not, even with the help of family member he could not put any weight on the right leg because of the severe shooting down pain.  ED Course: Low-grade fever 100.5 no tachycardia, no hypotension.  ESR 5, CRP 1.2, WBC 6.4.  MRI lumbar spine L2-3 level to S1 level measure 8.4 x 2.6 x 3.4 cm fluid collection.  Changes.  Improving meet-moderate bilateral foreman stenosis L3-4 L4-5, impingement of descending L5 nerve root.  Review of Systems: As per HPI otherwise 14 point review of systems negative.    Past Medical History:  Diagnosis Date   Acute renal insufficiency 12/01/2013   Asthma    hx child   Back injury 2014   Fell off ladder and broke back, BP medication being adjusted and  passed out while on ladder   Celiac disease    DDD (degenerative disc disease), lumbar    Dehydration 12/01/2013   Elevated PSA    GERD (gastroesophageal reflux disease)    Grade I diastolic dysfunction    High cholesterol    History of kidney stones    HTN (hypertension) 12/01/2013   Hyperlipidemia 12/01/2013   Hypertension    Hypotension 11/30/2013   Lethargy due to narcotics 12/14/2013   Lumbar radiculopathy 01/01/2014   Malignant neoplasm of prostate ([place]) 10/02/2017   Multiple fractures of ribs of left side 12/01/2013   Neuromuscular disorder ([place])    Nerve damage due to history of broken back   Prostate cancer ([place])    Pulmonary hypertension ([place])    Moderate   Spondylolisthesis at L5-S1 level 09/10/2014   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/2014   Syncope 11/30/2013   T11 vertebral fracture ([place]) 11/30/2013   Traumatic burst fracture of thoracic vertebra ([place]) 12/04/2013   Wears hearing aid     Past Surgical History:  Procedure Laterality Date   BACK SURGERY  06   BIOPSY PROSTATE  08/14/2017   COLONOSCOPY     CYSTOSCOPY/RETROGRADE/URETEROSCOPY/STONE EXTRACTION WITH BASKET Right 11/28/2017   Procedure: RIGHT URETEROSCOPY/STONE EXTRACTION WITH BASKET;  Surgeon: Irine Seal, MD;  Location: [facility];  Service: Urology;  Laterality: Right;   CYSTOSCOPY/URETEROSCOPY/HOLMIUM LASER/STENT PLACEMENT Right 12/23/2018   Procedure: CYSTOSCOPY RIGHT RETROGRADE PYELOGRAM URETEROSCOPY;  Surgeon: Irine Seal, MD;  Location: [facility];  Service: Urology;  Laterality: Right;   EXTRACORPOREAL SHOCK WAVE LITHOTRIPSY Right 10/10/2017   Procedure: RIGHT EXTRACORPOREAL SHOCK WAVE LITHOTRIPSY (ESWL);  Surgeon: Raynelle Bring, MD;  Location: [facility];  Service: Urology;  Laterality: Right;   HERNIA REPAIR Left    teen   TRANSRECTAL ULTRASOUND  08/14/2017     reports that he quit smoking about 54 years ago. His smoking use included cigarettes. He has a 2.50 pack-year smoking history. His smokeless tobacco use  includes snuff. He reports current alcohol use. He reports that he does not use drugs.  Allergies  Allergen Reactions   Gluten Meal Other (See Comments)    Upset stomach    Family History  Problem Relation Age of Onset   Hypertension Paternal Grandfather     Prior to Admission medications   Medication Sig Start Date End Date Taking? Authorizing Provider  albuterol (VENTOLIN HFA) 108 (90 Base) MCG/ACT inhaler Inhale 1-2 puffs into the lungs every 6 (six) hours as needed for wheezing or shortness of breath. 02/17/20   Lannie Fields, PA-C  aspirin EC 81 MG tablet Take 81 mg by mouth daily.    [provider]  calcium carbonate (OS-CAL) 600 MG TABS tablet Take 600 mg by mouth daily with breakfast.    [provider]  cholecalciferol (VITAMIN D) 25 MCG (1000 UT) tablet Take 1,000 Units by mouth daily.    [provider]  Coenzyme Q10 (COQ10) 200 MG CAPS Take 200 mg by mouth daily.    [provider]  gabapentin (NEURONTIN) 300 MG capsule Take 300 mg by mouth 4 (four) times daily. 11/25/18   [provider]  hydrochlorothiazide (HYDRODIURIL) 25 MG tablet Take 25 mg by mouth daily.  11/17/13   [provider]  lisinopril (ZESTRIL) 10 MG tablet Take 10 mg by mouth daily. 11/09/18   [provider]  Multiple Vitamin (MULTIVITAMIN WITH MINERALS) TABS tablet Take 1 tablet by mouth daily.    [provider]  Omega-3 Fatty Acids (FISH OIL) 1000 MG CAPS Take 1,000 mg by mouth daily.    [provider]  ondansetron (ZOFRAN) 4 MG tablet Take 1 tablet (4 mg total) by mouth every 8 (eight) hours as needed for nausea or vomiting. 02/17/20   Lannie Fields, PA-C  pravastatin (PRAVACHOL) 10 MG tablet Take 10 mg by mouth daily. 11/23/18   [provider]  RABEprazole (ACIPHEX) 20 MG tablet Take 20 mg by mouth daily.  07/09/17   [provider]  Tetrahydroz-Polyvinyl Al-Povid (CLEAR EYES TRIPLE ACTION) 0.05-0.5-0.6 %  SOLN Place 1 drop into both eyes 3 (three) times daily as needed (dry/irritated eyes.).    [provider]    Physical Exam: Vitals:   10/01/21 1430 10/01/21 1443 10/01/21 1445 10/01/21 1457  BP: 129/76  (!) 118/57   Pulse: 72  75   Resp:   17   Temp:  (!) 100.5 F (38.1 C)    TempSrc:  Oral    SpO2: 98%  95%   Weight:    81.6 kg  Height:    _0  (1.702 m)    Constitutional: NAD, calm, comfortable Vitals:   10/01/21 1430 10/01/21 1443 10/01/21 1445 10/01/21 1457  BP: 129/76  (!) 118/57   Pulse: 72  75   Resp:   17   Temp:  (!) 100.5 F (38.1 C)    TempSrc:  Oral    SpO2: 98%  95%   Weight:    81.6 kg  Height:    _0  (1.702 m)   Eyes: PERRL, lids and conjunctivae normal ENMT: Mucous membranes are moist. Posterior pharynx clear of any exudate or lesions.Normal dentition.  Neck: normal, supple, no masses, no thyromegaly Respiratory: clear to auscultation bilaterally, no wheezing, no crackles. Normal respiratory effort. No accessory muscle use.  Cardiovascular: Regular rate and rhythm, no murmurs / rubs / gallops. No extremity edema. 2+ pedal pulses. No carotid bruits.  Abdomen: no tenderness, no masses palpated. No hepatosplenomegaly. Bowel sounds positive.  Musculoskeletal: Elevation of right leg triggers severe shooting down pain, significant reduced ROM of right hip doing trending down pain Skin: no rashes, lesions, ulcers. No induration Neurologic: CN 2-12 grossly intact. Sensation intact, DTR normal. Strength 5/5 in all 4.  Psychiatric: Normal judgment and insight. Alert and oriented x 3. Normal mood.     Labs on Admission: I have personally reviewed following labs and imaging studies  CBC: Recent Labs  Lab 10/01/21 1346  WBC 6.4  NEUTROABS 5.4  HGB 14.7  HCT 46.3  MCV 92.8  PLT 762   Basic Metabolic Panel: Recent Labs  Lab 10/01/21 1412  NA 140  K 3.8  CL 106  CO2 23  GLUCOSE 98  BUN 22  CREATININE 1.27*  CALCIUM 8.7*    GFR: Estimated Creatinine Clearance: 51.4 mL/min (A) (by C-G formula based on SCr of 1.27 mg/dL (H)). Liver Function Tests: Recent Labs  Lab 10/01/21 1412  AST 33  ALT 19  ALKPHOS 107  BILITOT 1.0  PROT 7.2  ALBUMIN 3.9   No results for input(s): LIPASE, AMYLASE in the last 168 hours. No results for input(s): AMMONIA in the last 168 hours. Coagulation Profile: No results for input(s): INR, PROTIME in the last 168 hours. Cardiac Enzymes: No results for input(s): CKTOTAL, CKMB, CKMBINDEX, TROPONINI in the last 168 hours. BNP (last 3 results) No results for input(s): PROBNP in the last 8760 hours. HbA1C: No results for input(s): HGBA1C in the last 72 hours. CBG: No results for input(s): GLUCAP in the last 168 hours. Lipid Profile: No results for input(s): CHOL, HDL, LDLCALC, TRIG, CHOLHDL, LDLDIRECT in the last 72 hours. Thyroid Function Tests: No results for input(s): TSH, T4TOTAL, FREET4, T3FREE, THYROIDAB in the last 72 hours. Anemia Panel: No results for input(s): VITAMINB12, FOLATE, FERRITIN, TIBC, IRON, RETICCTPCT in the last 72 hours. Urine analysis:    Component Value Date/Time   COLORURINE YELLOW 10/01/2021 1430   APPEARANCEUR CLEAR 10/01/2021 1430   LABSPEC 1.024 10/01/2021 1430   PHURINE 5.0 10/01/2021 1430   GLUCOSEU NEGATIVE 10/01/2021 1430   HGBUR NEGATIVE 10/01/2021 1430   BILIRUBINUR NEGATIVE 10/01/2021 1430   KETONESUR NEGATIVE 10/01/2021 1430   PROTEINUR NEGATIVE 10/01/2021 1430   UROBILINOGEN 0.2 12/10/2013 1619   NITRITE NEGATIVE 10/01/2021 1430   LEUKOCYTESUR NEGATIVE 10/01/2021 1430    Radiological Exams on Admission: CT Lumbar Spine Wo Contrast  Result Date: 10/01/2021 CLINICAL DATA:  Low back pain, increased fracture risk EXAM: CT LUMBAR SPINE WITHOUT CONTRAST TECHNIQUE: Multidetector CT imaging of the lumbar spine was performed without intravenous contrast administration. Multiplanar CT image reconstructions were also generated. RADIATION  DOSE REDUCTION: This exam was performed according to the departmental dose-optimization program which includes automated exposure control, adjustment of the mA and/or kV according to patient size and/or use of iterative reconstruction technique. COMPARISON:  Correlation made with MRI from January 2023 FINDINGS: Segmentation: 5 lumbar type vertebrae. Alignment: Grade 1 anterolisthesis at L4-L5 and L5-S1. Vertebrae: Chronic T11 compression fracture. Stable  lumbar vertebral body heights. Postoperative changes are again identified at L3-S1 with rods and pedicle screws and interbody spacers. There is associated streak artifact. No evidence of hardware complication. Bridging bone is present at L5-S1. Paraspinal and other soft tissues: Chronic postoperative changes. Atherosclerosis. Disc levels: L1-L2: Disc bulge and facet hypertrophy. No significant canal stenosis mild foraminal stenosis. L2-L3: Disc bulge. Facet hypertrophy. No significant canal or foraminal stenosis. L3-L4: Operative level. Endplate osteophytes. Facet hypertrophy. No significant canal stenosis. Mild to moderate foraminal stenosis. L4-L5: Operative level. Endplate osteophytes. Facet hypertrophy. Canal has been decompressed. Foramina are distorted by artifact. Probable marked bilateral foraminal narrowing. L5-S1: Operative level. Endplate osteophytes. Facet hypertrophy. Canal has been decompressed. Foramina distorted by artifact with probable mild stenosis. IMPRESSION: No acute abnormality. Postoperative changes at L3-S1 without evidence of hardware complication. Probable marked bilateral foraminal narrowing at L4-L5 also present on prior MRI. Electronically Signed   By: Macy Mis M.D.   On: 10/01/2021 13:13   MR Lumbar Spine W Wo Contrast  Result Date: 10/01/2021 CLINICAL DATA:  Low back pain, prior surgery, new symptoms Patient also with Fever today. History of back surgery on 07/31/2021 EXAM: MRI LUMBAR SPINE WITHOUT AND WITH CONTRAST  TECHNIQUE: Multiplanar and multiecho pulse sequences of the lumbar spine were obtained without and with intravenous contrast. CONTRAST:  7.76m GADAVIST GADOBUTROL 1 MMOL/ML IV SOLN COMPARISON:  MRI 06/12/2021.  CT 10/01/2021 FINDINGS: Segmentation:  Standard. Alignment:  Grade 1 anterolisthesis of L4 on L5 and L5 on S1. Vertebrae: Posterior and interbody fusion at L3-S1. Remote chronic superior endplate compression fracture of T11. No acute fracture. No evidence of discitis. No suspicious bone lesion. Conus medullaris and cauda equina: Conus extends to the T12-L1 level. Conus and cauda equina appear normal. No epidural fluid collection. Paraspinal and other soft tissues: Left paramidline postoperative fluid collection extending from the L2-3 level to the S1 level measuring up to 8.4 x 2.6 x 3.4 cm. Collection is peripherally enhancing on postcontrast sequences. Smaller component of the collection extends to the right of the L2 spinous process. No prevertebral abnormality. Disc levels: T12-L1: Mild disc bulge. No canal stenosis. Mild bilateral foraminal stenosis. Unchanged. L1-L2: Mild diffuse disc bulge. Mild bilateral facet arthropathy. No canal stenosis. Borderline-mild bilateral foraminal stenosis. Unchanged. L2-L3: Mild annular disc bulge. Mild bilateral facet arthropathy. No canal stenosis. Borderline-mild bilateral foraminal stenosis. Unchanged. L3-L4: Interval fusion. No canal stenosis. Mild-moderate bilateral foraminal stenosis, improved from prior. L4-L5: Interval fusion and decompression on the right. Anterolisthesis with disc uncovering. Persistent low T2 signal nonenhancing structure along the posterior aspect of the left lateral recess resulting in impingement of the descending left L5 nerve root (series 8, image 27), possibly sequestered disc fragment. No canal stenosis. Mild-moderate left foraminal stenosis, improved from prior. No significant residual right-sided foraminal stenosis is evident. L5-S1:  Prior fusion and decompression. No evidence of residual impingement. Unchanged. IMPRESSION: 1. No acute osseous abnormality.  No evidence of discitis. 2. Left paramidline postoperative fluid collection extending from the L2-3 level to the S1 level measuring up to 8.4 x 2.6 x 3.4 cm. Collection is peripherally enhancing on postcontrast sequences, and could be infected. 3. Prior L3-S1 fusion.  No canal stenosis at any level. 4. Mild-moderate bilateral foraminal stenosis at L3-L4 and mild-moderate left foraminal stenosis at L4-5, improved from prior. 5. Persistent low signal nonenhancing structure along the posterior aspect of the left lateral recess resulting in impingement of the descending left L5 nerve root. This may represent a sequestered disc fragment. Electronically Signed  By: Davina Poke D.O.   On: 10/01/2021 16:29   DG Chest Port 1 View  Result Date: 10/01/2021 CLINICAL DATA:  Pain and swelling in legs. EXAM: PORTABLE CHEST 1 VIEW COMPARISON:  April 11, 2021 FINDINGS: The heart size and mediastinal contours are within normal limits. Both lungs are clear. The visualized skeletal structures are unremarkable. IMPRESSION: No active disease. Electronically Signed   By: Dorise Bullion III M.D.   On: 10/01/2021 15:28    EKG: NOne  Assessment/Plan Principal Problem:   Impaired ambulation Active Problems:   Lumbar radiculopathy  (please populate well all problems here in Problem List. (For example, if patient is on BP meds at home and you resume or decide to hold them, it is a problem that needs to be her. Same for CAD, COPD, HLD and so on)  Acute impaired ambulation -Flareup of right sided sciatica likely from L5-S1 nerve root impingement from previous lumbar OA/degeneration.  MRI reassuring.  Neurosurgery Dr. Venetia Constable reviewed MRI and recommend conservative management.  Patient also contacted his on neurosurgery at [facility] recommend office follow-up with injection next week.  Clinically  there is no significant focal neurodeficit. -Pain control, PT evaluation tomorrow, likely can go home tomorrow if passing PT evaluation.  Fever -Low-grade, no significant white count increase, no significant increase of ESR or CRP.  Lumbar MRI reviewed by neurosurgery Dr. Venetia Constable, impression is the fluid collection probably seroma/postop changes, unlikely infection/abscess. -Suspect the fever comes from acute colitis, probably viral. -Expect if no white count increased tomorrow, and fever subsided, likely can discharge home tomorrow.  Acute colitis -Probably viral and self-limiting.  Took Imodium x1 at home.  No more diarrhea this afternoon.  Will monitor, repeat CBC tomorrow.  HTN -Continue home BP meds  Mild intermittent asthma -No symptoms or signs of exacerbation.  DVT prophylaxis: Lovenox Code Status: Full code Family Communication: Wife at bedside Disposition Plan: Expect less than 2 midnight hospital stay Consults called: Neurosurgery Admission status: MedSurg observation   Lequita Halt MD [facility] Pager [phone]  10/01/2021, 6:53 PM

## 2021-10-01 NOTE — ED Notes (Signed)
When assisting pt out of bed a blister and redness was noted on the pt's LLE that was not noted earlier.  His wife stated that she had removed a tick from that spot yesterday.  Md Dr Roosevelt Locks notified.

## 2021-10-01 NOTE — ED Provider Notes (Signed)
[facility] EMERGENCY DEPARTMENT Provider Note   CSN: [id number] Arrival date & time: 10/01/21  1214     History  No chief complaint on file.   Alexander Cox is a 76 y.o. male.  76 year old male with prior medical history as detailed below presents for evaluation.  Patient reports increased right sciatica pain.  Patient reports increased pain over the last several days.  Patient reports that his pain is consistent with prior episodes of sciatica.  Patient reports that this morning he was trying to stretch out his right leg which is helped in the past with his sciatica.  While on the floor stretching he experienced enough pain that he could no longer stand.  His family was able to get him off the floor and they called EMS.  Patient reports increased pain to the right low back and right leg with radiation of pain all the way to the right foot.  Patient's pain is worse with lying flat.  Patient's pain is improved with standing.  He denies recent fever.  He denies nausea or vomiting.  He denies chest pain or shortness of breath.  He reports improvement of pain with use of hydrocodone at home both last night and this morning.  Patient with prior history of lumbar back surgery.  He is status post a revision of L3-S1 PSF/TLIF/Laminectomy with Dr. Ivan Croft on 07/31/2021 at [facility].  Reports that he has done well since the surgery.     The history is provided by the patient, the EMS personnel and medical records.  Back Pain Location:  Lumbar spine Quality:  Aching Radiates to:  R foot Pain severity:  Moderate Pain is:  Worse during the night Onset quality:  Gradual Duration:  4 days Timing:  Rare Progression:  Worsening Chronicity:  New     Home Medications Prior to Admission medications   Medication Sig Start Date End Date Taking? Authorizing Provider  albuterol (VENTOLIN HFA) 108 (90 Base) MCG/ACT inhaler Inhale 1-2 puffs into the lungs every 6  (six) hours as needed for wheezing or shortness of breath. 02/17/20   Lannie Fields, PA-C  aspirin EC 81 MG tablet Take 81 mg by mouth daily.    [provider]  calcium carbonate (OS-CAL) 600 MG TABS tablet Take 600 mg by mouth daily with breakfast.    [provider]  cholecalciferol (VITAMIN D) 25 MCG (1000 UT) tablet Take 1,000 Units by mouth daily.    [provider]  Coenzyme Q10 (COQ10) 200 MG CAPS Take 200 mg by mouth daily.    [provider]  gabapentin (NEURONTIN) 300 MG capsule Take 300 mg by mouth 4 (four) times daily. 11/25/18   [provider]  hydrochlorothiazide (HYDRODIURIL) 25 MG tablet Take 25 mg by mouth daily.  11/17/13   [provider]  lisinopril (ZESTRIL) 10 MG tablet Take 10 mg by mouth daily. 11/09/18   [provider]  Multiple Vitamin (MULTIVITAMIN WITH MINERALS) TABS tablet Take 1 tablet by mouth daily.    [provider]  Omega-3 Fatty Acids (FISH OIL) 1000 MG CAPS Take 1,000 mg by mouth daily.    [provider]  ondansetron (ZOFRAN) 4 MG tablet Take 1 tablet (4 mg total) by mouth every 8 (eight) hours as needed for nausea or vomiting. 02/17/20   Lannie Fields, PA-C  pravastatin (PRAVACHOL) 10 MG tablet Take 10 mg by mouth daily. 11/23/18   [provider]  RABEprazole (ACIPHEX)  20 MG tablet Take 20 mg by mouth daily.  07/09/17   [provider]  Tetrahydroz-Polyvinyl Al-Povid (CLEAR EYES TRIPLE ACTION) 0.05-0.5-0.6 % SOLN Place 1 drop into both eyes 3 (three) times daily as needed (dry/irritated eyes.).    [provider]      Allergies    Gluten meal    Review of Systems   Review of Systems  Musculoskeletal:  Positive for back pain.  All other systems reviewed and are negative.  Physical Exam Updated Vital Signs BP 122/71 (BP Location: Left Arm)   Pulse 74   Temp 99.2 F (37.3 C) (Oral)   Resp 16   SpO2 99%  Physical Exam Vitals and nursing note  reviewed.  Constitutional:      General: He is not in acute distress.    Appearance: Normal appearance. He is well-developed.  HENT:     Head: Normocephalic and atraumatic.  Eyes:     Conjunctiva/sclera: Conjunctivae normal.     Pupils: Pupils are equal, round, and reactive to light.  Cardiovascular:     Rate and Rhythm: Normal rate and regular rhythm.     Heart sounds: Normal heart sounds.  Pulmonary:     Effort: Pulmonary effort is normal. No respiratory distress.     Breath sounds: Normal breath sounds.  Abdominal:     General: There is no distension.     Palpations: Abdomen is soft.     Tenderness: There is no abdominal tenderness.  Musculoskeletal:        General: No deformity. Normal range of motion.     Cervical back: Normal range of motion and neck supple.  Skin:    General: Skin is warm and dry.  Neurological:     General: No focal deficit present.     Mental Status: He is alert and oriented to person, place, and time.     Comments: Increased pain to the right lower extremity with straight leg raise.  Pain radiates to the lateral aspect of the right foot.  Patient without notable weakness in either distal lower extremity.  Intact sensation noted in both lower distal extremities.    ED Results / Procedures / Treatments   Labs (all labs ordered are listed, but only abnormal results are displayed) Labs Reviewed  CULTURE, BLOOD (ROUTINE X 2)  CULTURE, BLOOD (ROUTINE X 2)  RESP PANEL BY RT-PCR (FLU A&B, COVID) ARPGX2  CBC WITH DIFFERENTIAL/PLATELET  LACTIC ACID, PLASMA  LACTIC ACID, PLASMA  URINALYSIS, ROUTINE W REFLEX MICROSCOPIC  SEDIMENTATION RATE  C-REACTIVE PROTEIN  COMPREHENSIVE METABOLIC PANEL    EKG None  Radiology CT Lumbar Spine Wo Contrast  Result Date: 10/01/2021 CLINICAL DATA:  Low back pain, increased fracture risk EXAM: CT LUMBAR SPINE WITHOUT CONTRAST TECHNIQUE: Multidetector CT imaging of the lumbar spine was performed without intravenous  contrast administration. Multiplanar CT image reconstructions were also generated. RADIATION DOSE REDUCTION: This exam was performed according to the departmental dose-optimization program which includes automated exposure control, adjustment of the mA and/or kV according to patient size and/or use of iterative reconstruction technique. COMPARISON:  Correlation made with MRI from January 2023 FINDINGS: Segmentation: 5 lumbar type vertebrae. Alignment: Grade 1 anterolisthesis at L4-L5 and L5-S1. Vertebrae: Chronic T11 compression fracture. Stable lumbar vertebral body heights. Postoperative changes are again identified at L3-S1 with rods and pedicle screws and interbody spacers. There is associated streak artifact. No evidence of hardware complication. Bridging bone is present at L5-S1. Paraspinal and other soft tissues: Chronic postoperative changes.  Atherosclerosis. Disc levels: L1-L2: Disc bulge and facet hypertrophy. No significant canal stenosis mild foraminal stenosis. L2-L3: Disc bulge. Facet hypertrophy. No significant canal or foraminal stenosis. L3-L4: Operative level. Endplate osteophytes. Facet hypertrophy. No significant canal stenosis. Mild to moderate foraminal stenosis. L4-L5: Operative level. Endplate osteophytes. Facet hypertrophy. Canal has been decompressed. Foramina are distorted by artifact. Probable marked bilateral foraminal narrowing. L5-S1: Operative level. Endplate osteophytes. Facet hypertrophy. Canal has been decompressed. Foramina distorted by artifact with probable mild stenosis. IMPRESSION: No acute abnormality. Postoperative changes at L3-S1 without evidence of hardware complication. Probable marked bilateral foraminal narrowing at L4-L5 also present on prior MRI. Electronically Signed   By: Macy Mis M.D.   On: 10/01/2021 13:13    Procedures Procedures    Medications Ordered in ED Medications  ketorolac (TORADOL) 30 MG/ML injection 15 mg (has no administration in time  range)  sodium chloride 0.9 % bolus 500 mL (has no administration in time range)  oxyCODONE-acetaminophen (PERCOCET/ROXICET) 5-325 MG per tablet 1 tablet (1 tablet Oral Given 10/01/21 1343)    ED Course/ Medical Decision Making/ A&P                           Medical Decision Making Amount and/or Complexity of Data Reviewed Labs: ordered. Radiology: ordered.  Risk Prescription drug management. Decision regarding hospitalization.    Medical Screen Complete  This patient presented to the ED with complaint of back pain/sciatica.  This complaint involves an extensive number of treatment options. The initial differential diagnosis includes, but is not limited to, sciatica, viral versus bacterial infection, metabolic abnormality, etc.  This presentation is: Acute, Chronic, Self-Limited, Previously Undiagnosed, Uncertain Prognosis, Complicated, Systemic Symptoms, and Threat to Life/Bodily Function  Patient is presenting with reported increased pain to the right low back and right leg.  Patient's presentation is consistent with sciatica.  Patient with prior history of sciatica.  During initial evaluation patient is noted to be having severe tremors and shaking.  This is consistent with likely rigors.  Repeat temperature checking reveals fever of 100.5.  Patient's family reports recent viral syndromes in the household consistent with likely viral gastroenteritis.  Patient apparently had loose stool this morning.  He did not report this at time of initial exam.  Imaging obtained.  MRI demonstrates evidence of left-sided fluid collection near site of prior surgery in the lumbar spine.  Imaging reviewed with Dr. Venetia Constable covering neurosurgery and spine.  Dr. Venetia Constable does not feel that imaging is suggestive of likely acute infection.  Patient's labs on the whole are reassuring  Patient does moderately improved after ED treatment.  Given patient's fever and uncertain source of same we  will plan for admission and observation.  Hospitalist services made aware of case and will evaluate for admission.  Additional history obtained:  Additional history obtained from Spouse External records from outside sources obtained and reviewed including prior ED visits and prior Inpatient records.    Lab Tests:  I ordered and personally interpreted labs.  The pertinent results include: See C, CMP, blood cultures, lactic acid, sed rate, CRP, UA   Imaging Studies ordered:  I ordered imaging studies including CT lumbar spine, MRI lumbar spine, chest x-ray I independently visualized and interpreted obtained imaging which showed chest x-ray is without acute pathology I agree with the radiologist interpretation.   Cardiac Monitoring:  The patient was maintained on a cardiac monitor.  I personally viewed and interpreted the cardiac monitor which showed  an underlying rhythm of: NSR   Medicines ordered:  I ordered medication including Toradol, oxycodone for pain Reevaluation of the patient after these medicines showed that the patient: improved   Problem List / ED Course:  Right-sided sciatica, fever   Reevaluation:  After the interventions noted above, I reevaluated the patient and found that they have: improved  Disposition:  After consideration of the diagnostic results and the patients response to treatment, I feel that the patent would benefit from admission for observation.   CRITICAL CARE Performed by: Valarie Merino   Total critical care time: 30 minutes  Critical care time was exclusive of separately billable procedures and treating other patients.  Critical care was necessary to treat or prevent imminent or life-threatening deterioration.  Critical care was time spent personally by me on the following activities: development of treatment plan with patient and/or surrogate as well as nursing, discussions with consultants, evaluation of patient's response to  treatment, examination of patient, obtaining history from patient or surrogate, ordering and performing treatments and interventions, ordering and review of laboratory studies, ordering and review of radiographic studies, pulse oximetry and re-evaluation of patient's condition.          Final Clinical Impression(s) / ED Diagnoses Final diagnoses:  Fever, unspecified fever cause  Other acute back pain    Rx / DC Orders ED Discharge Orders     None         Valarie Merino, MD 10/01/21 1745

## 2021-10-02 ENCOUNTER — Encounter (HOSPITAL_COMMUNITY): Payer: Self-pay | Admitting: Internal Medicine

## 2021-10-02 DIAGNOSIS — W57XXXA Bitten or stung by nonvenomous insect and other nonvenomous arthropods, initial encounter: Secondary | ICD-10-CM

## 2021-10-02 DIAGNOSIS — R509 Fever, unspecified: Secondary | ICD-10-CM

## 2021-10-02 DIAGNOSIS — R262 Difficulty in walking, not elsewhere classified: Secondary | ICD-10-CM

## 2021-10-02 LAB — CBC
HCT: 39.5 % (ref 39.0–52.0)
Hemoglobin: 12.6 g/dL — ABNORMAL LOW (ref 13.0–17.0)
MCH: 29.1 pg (ref 26.0–34.0)
MCHC: 31.9 g/dL (ref 30.0–36.0)
MCV: 91.2 fL (ref 80.0–100.0)
Platelets: 197 10*3/uL (ref 150–400)
RBC: 4.33 MIL/uL (ref 4.22–5.81)
RDW: 12.6 % (ref 11.5–15.5)
WBC: 5.4 10*3/uL (ref 4.0–10.5)
nRBC: 0 % (ref 0.0–0.2)

## 2021-10-02 LAB — PROCALCITONIN: Procalcitonin: 0.96 ng/mL

## 2021-10-02 MED ORDER — OXYCODONE HCL 5 MG PO TABS: 5.0000 mg | ORAL_TABLET | Freq: Four times a day (QID) | ORAL | 0 refills | Status: AC | PRN

## 2021-10-02 NOTE — Hospital Course (Signed)
Alexander Cox is a 76 y.o. 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  Morning of admission, he was on the floor stretching (which has helped in the past with his sciatica) when he had worsening of pain, and could not get up.  His family was able to get him off the floor, so EMS was called.  In the ER, CT of the lumbar spine showed no acute abnormality and expected postoperative changes and bilateral neuroforaminal narrowing at L4-L5 similar to previous.  MRI w and wo confirmed no evidence of discitis, but did show a LEFT paramidline postoperative fluid collection that was peripherally enhancing  extending from L2-3 to S1, the L4-L5 narrowing, and a nonenhancing structure along the posterior left lateral recess by the left L5 nerve root suggesting sequestered disc fragment.

## 2021-10-02 NOTE — TOC Transition Note (Signed)
Transition of Care [facility]) - CM/SW Discharge Note   Patient Details  Name: Alexander Cox MRN: [id number] Date of Birth: 1946-05-12  [facility] of [facility] CM/SW Contact:  Pollie Friar, RN Phone Number: 10/02/2021, 10:14 AM   Clinical Narrative:    Patient is discharging home with self care. No needs per PT.  Pt has transportation home.    Final next level of care: Home/Self Care Barriers to Discharge: No Barriers Identified   Patient Goals and CMS Choice        Discharge Placement                       Discharge Plan and Services                                     Social Determinants of Health (SDOH) Interventions     Readmission Risk Interventions     View : No data to display.

## 2021-10-02 NOTE — Evaluation (Signed)
Physical Therapy Evaluation and DISCHARGE Patient Details Name: Alexander Cox MRN: [id number] DOB: 1946-02-26 Today's Date: 10/02/2021  History of Present Illness  Alexander Cox is a 76 y.o. male with medical history significant of lumbar radiculopathy status post L3-S1 PSF/TLIF/laminectomy March 2023, HTN, HLD, mild intermittent asthma, presented with fall, worsening of right leg pain, new onset diarrhea and low fever.   Clinical Impression  Pt functioning a supervision level with exception of bed mobility. Pt reports R LE pain to be better than PTA. The worst when trying to get up to EOB but improved during ambulation. Pt with good use of SPC and safely completed stair negotiation necessary to enter home. Pt with no further acute PT services needed at this time. Recommend pt to asked surgeon at his follow up if he wants him to do more outpt PT. PT SIGNING OFF.       Recommendations for follow up therapy are one component of a multi-disciplinary discharge planning process, led by the attending physician.  Recommendations may be updated based on patient status, additional functional criteria and insurance authorization.  Follow Up Recommendations No PT follow up (pt to follow up with neurosurgery upon d/c and neurosurgeon can recommend outpt if he desires)    Assistance Recommended at Discharge Intermittent Supervision/Assistance  Patient can return home with the following       Equipment Recommendations None recommended by PT  Recommendations for Other Services       Functional Status Assessment Patient has had a recent decline in their functional status and demonstrates the ability to make significant improvements in function in a reasonable and predictable amount of time.     Precautions / Restrictions Precautions Precautions: Fall Restrictions Weight Bearing Restrictions: No      Mobility  Bed Mobility Overal bed mobility: Needs Assistance Bed Mobility: Rolling,  Sidelying to Sit Rolling: Min assist Sidelying to sit: Min assist       General bed mobility comments: minA with rolling to the L, pt states, "I normally get out on the R side of the bed".    Transfers Overall transfer level: Modified independent Equipment used: Straight cane               General transfer comment: pt with good mechanics and mangement of SPC    Ambulation/Gait Ambulation/Gait assistance: Min guard Gait Distance (Feet): 200 Feet Assistive device: Straight cane Gait Pattern/deviations: Step-through pattern, Decreased stride length Gait velocity: wfl     General Gait Details: no antalgia, instructed pt to use SPC in L UE to off weight R LE, pt reports "oh yeah i forgot", no episode of LOB, fluid gait pattern, pt reports improved R hip pain  Stairs Stairs: Yes Stairs assistance: Supervision Stair Management: One rail Left, Alternating pattern Number of Stairs: 2 General stair comments: used SPC well and properly  Wheelchair Mobility    Modified Rankin (Stroke Patients Only)       Balance Overall balance assessment: Modified Independent, Mild deficits observed, not formally tested (needs [facility] for safe ambulation)                                           Pertinent Vitals/Pain Pain Assessment Pain Assessment: 0-10 Pain Score: 8  Pain Location: R hip/LE, 7-8/10 when trying to transfer EOB, 4-5/10 during ambulation Pain Descriptors / Indicators: Radiating Pain Intervention(s): Monitored during session  Home Living Family/patient expects to be discharged to:: Private residence Living Arrangements: Spouse/significant other Available Help at Discharge: Family;Available PRN/intermittently Type of Home: House Home Access: Stairs to enter Entrance Stairs-Rails: Left Entrance Stairs-Number of Steps: 3   Home Layout: One level Home Equipment: Cane - single point;Tub bench      Prior Function Prior Level of Function :  Independent/Modified Independent             Mobility Comments: was walking without AD up until 1 week ago ADLs Comments: indep     Hand Dominance   Dominant Hand: Right    Extremity/Trunk Assessment   Upper Extremity Assessment Upper Extremity Assessment: Overall WFL for tasks assessed    Lower Extremity Assessment Lower Extremity Assessment: RLE deficits/detail RLE Deficits / Details: generalized weakness due to pain    Cervical / Trunk Assessment Cervical / Trunk Assessment: Other exceptions Cervical / Trunk Exceptions: recent back surgery  Communication   Communication: No difficulties  Cognition Arousal/Alertness: Awake/alert Behavior During Therapy: WFL for tasks assessed/performed Overall Cognitive Status: Within Functional Limits for tasks assessed                                          General Comments General comments (skin integrity, edema, etc.): WFL, small R spot on L lateral calf    Exercises     Assessment/Plan    PT Assessment Patient does not need any further PT services  PT Problem List         PT Treatment Interventions      PT Goals (Current goals can be found in the Care Plan section)  Acute Rehab PT Goals Patient Stated Goal: stop the pain PT Goal Formulation: All assessment and education complete, DC therapy    Frequency       Co-evaluation               AM-PAC PT "6 Clicks" Mobility  Outcome Measure Help needed turning from your back to your side while in a flat bed without using bedrails?: A Little Help needed moving from lying on your back to sitting on the side of a flat bed without using bedrails?: A Little Help needed moving to and from a bed to a chair (including a wheelchair)?: None Help needed standing up from a chair using your arms (e.g., wheelchair or bedside chair)?: None Help needed to walk in hospital room?: None Help needed climbing 3-5 steps with a railing? : A Little 6 Click Score:  21    End of Session Equipment Utilized During Treatment: Back brace Activity Tolerance: Patient tolerated treatment well Patient left: in chair;with call bell/phone within reach;with family/visitor present Nurse Communication: Mobility status PT Visit Diagnosis: Unsteadiness on feet (R26.81)    Time: 9357-0177 PT Time Calculation (min) (ACUTE ONLY): 20 min   Charges:   PT Evaluation $PT Eval Low Complexity: 1 Low          Kittie Plater, PT, DPT Acute Rehabilitation Services Secure chat preferred Office #: [phone]   Berline Lopes 10/02/2021, 8:21 AM

## 2021-10-02 NOTE — Assessment & Plan Note (Signed)
With diarrhea.  Wife had similar symptoms few days prior.

## 2021-10-02 NOTE — Plan of Care (Signed)

## 2021-10-02 NOTE — Discharge Summary (Signed)
Physician Discharge Summary   Patient: Alexander Cox MRN: [id number] DOB: Mar 06, 1946  Admit date:     10/01/2021  Discharge date: 10/02/21  Discharge Physician: Edwin Dada   PCP: Mayra Neer, MD     Recommendations at discharge:  Follow up with Dr. Sherlyn Lick Dr. Sherlyn Lick: CT lumbar spine and MRI lumbar spine images obtained in the ER on 10/01/21 were Power-shared to [facility]     Discharge Diagnoses: Principal Problem:   Impaired ambulation Active Problems:   Lumbar radiculopathy   HTN (hypertension)   Chronic kidney disease, stage 3a (HCC)   Tick bite   Fever        Hospital Course: Alexander Cox is a 76 y.o. M with HTN, CKD IIIa baseline 1.3-1.4, Pros CA and nephrolithiasis, and also hx fall off ladder in 2015 with vertebral burst fracture, later spondylolisthesis and Lumbar post-laminectomy syndrome s/p revision of lumbar fusion and hardware removal at [facility] last month who presented with acute buttock pain, inability to walk.  A few days prior to admission, started to notice more pain in right leg with moving that leg.  Morning of admission, he rolled over in bed and had acutely more pain in the right back and right leg.  He tried to do some stretches but was in too much pain to stand, so EMS were called.    In the ER, he could not walk.  CT of the lumbar spine showed no acute abnormality and expected postoperative changes and bilateral neuroforaminal narrowing at L4-L5 similar to previous.  MRI w and wo confirmed no evidence of discitis, but did show a LEFT paramidline postoperative fluid collection that was peripherally enhancing  extending from L2-3 to S1, the L4-L5 narrowing, and a nonenhancing structure along the posterior left lateral recess by the left L5 nerve root suggesting sequestered disc fragment.    Radiculopathy Imaging was reviewed by our local neurosurgeon who felt this appeared normal.  He has no left sided symptoms and I think  at this time the fever is not likely from infected seroma.    He has already reached out to his surgeon, I have power-shared the images to [facility] and he plans to follow up with them as soon as possible.Return precautions given.   Fever With diarrhea.  Wife had similar symptoms few days prior that were self-limited.  Return precautions given.  Tick Bite I do not think the fever is related.  No constitutional symptoms to suggest RMSF, nor rash or headache.  No EM lesion.  I think this was an uncomplicated tick exposure.          The [facility] Controlled Substances Registry was reviewed for this patient prior to discharge.   Consultants: Neurosurgery, called, reviewed case by phone only Procedures performed: MRI lumbar spine, CT lumbar spine  Disposition: Home   DISCHARGE MEDICATION: Allergies as of 10/02/2021       Reactions   Gluten Meal Other (See Comments)   Upset stomach        Medication List     TAKE these medications    albuterol 108 (90 Base) MCG/ACT inhaler Commonly known as: VENTOLIN HFA Inhale 1-2 puffs into the lungs every 6 (six) hours as needed for wheezing or shortness of breath.   aspirin EC 81 MG tablet Take 81 mg by mouth daily.   calcium carbonate 600 MG Tabs tablet Commonly known as: OS-CAL Take 600 mg by mouth daily with breakfast.   cholecalciferol 25 MCG (  1000 UNIT) tablet Commonly known as: VITAMIN D Take 1,000 Units by mouth daily.   Clear Eyes Triple Action 0.05-0.5-0.6 % Soln Generic drug: Tetrahydroz-Polyvinyl Al-Povid Place 1 drop into both eyes 3 (three) times daily as needed (dry/irritated eyes.).   CoQ10 200 MG Caps Take 200 mg by mouth daily.   Fish Oil 1000 MG Caps Take 1,000 mg by mouth daily.   gabapentin 300 MG capsule Commonly known as: NEURONTIN Take 300 mg by mouth 4 (four) times daily.   hydrochlorothiazide 25 MG tablet Commonly known as: HYDRODIURIL Take 25 mg by mouth daily.   lisinopril 10 MG  tablet Commonly known as: ZESTRIL Take 10 mg by mouth daily.   multivitamin with minerals Tabs tablet Take 1 tablet by mouth daily.   ondansetron 4 MG tablet Commonly known as: Zofran Take 1 tablet (4 mg total) by mouth every 8 (eight) hours as needed for nausea or vomiting.   oxyCODONE 5 MG immediate release tablet Commonly known as: Roxicodone Take 1 tablet (5 mg total) by mouth every 6 (six) hours as needed for up to 5 days.   pravastatin 10 MG tablet Commonly known as: PRAVACHOL Take 10 mg by mouth daily.   RABEprazole 20 MG tablet Commonly known as: ACIPHEX Take 20 mg by mouth daily.        Follow-up Information     Murlean Iba, MD Follow up.   Specialty: Orthopedic Surgery Contact information: [address] [place] [address] [phone]                 Discharge Instructions     Discharge instructions   Complete by: As directed    From Dr. Loleta Books: You were admitted for back pain. Your imaging here showed no obvious change from previous.  I have asked our Medical Records department to share the images with [facility] so Dr. Sherlyn Lick can see them  Throw the hydrocodone away Instead, trial oxycodone 2.5 mg (1/2 tab)  After an hour, if this doesn't work, take another 1/2 tab (to equal a whole tab) and from there, take a whole tab Use this sparingly, be cautious of using more than twice daily It can cause constipation  More seriously it can cause respiratory depression Do not allow near children  Use with acetaminophen, use no more than 6 acetaminophen in a day  You have already called Dr. Sherlyn Lick for an appointment, follow up with them if they don't call back  If you develop worsening fever, confusion, body-aches (flu like symptoms) or rash, seek care immediately   Increase activity slowly   Complete by: As directed        Discharge Exam: Filed Weights   10/01/21 1457  Weight: 81.6 kg    General: Pt is  alert, awake, not in acute distress Cardiovascular: RRR, nl S1-S2, no murmurs appreciated.   No LE edema.   Respiratory: Normal respiratory rate and rhythm.  CTAB without rales or wheezes. Abdominal: Abdomen soft and non-tender.  No distension or HSM.   Neuro/Psych: Strength symmetric in upper extremities. Lower extremity strength not tested due to pain.  Judgment and insight appear normal. Skin: No rashes.  The left shin has a small red excorations from where he was itching the tick bite.    Condition at discharge: good  The results of significant diagnostics from this hospitalization (including imaging, microbiology, ancillary and laboratory) are listed below for reference.   Imaging Studies: CT Lumbar Spine Wo Contrast  Result Date:  10/01/2021 CLINICAL DATA:  Low back pain, increased fracture risk EXAM: CT LUMBAR SPINE WITHOUT CONTRAST TECHNIQUE: Multidetector CT imaging of the lumbar spine was performed without intravenous contrast administration. Multiplanar CT image reconstructions were also generated. RADIATION DOSE REDUCTION: This exam was performed according to the departmental dose-optimization program which includes automated exposure control, adjustment of the mA and/or kV according to patient size and/or use of iterative reconstruction technique. COMPARISON:  Correlation made with MRI from January 2023 FINDINGS: Segmentation: 5 lumbar type vertebrae. Alignment: Grade 1 anterolisthesis at L4-L5 and L5-S1. Vertebrae: Chronic T11 compression fracture. Stable lumbar vertebral body heights. Postoperative changes are again identified at L3-S1 with rods and pedicle screws and interbody spacers. There is associated streak artifact. No evidence of hardware complication. Bridging bone is present at L5-S1. Paraspinal and other soft tissues: Chronic postoperative changes. Atherosclerosis. Disc levels: L1-L2: Disc bulge and facet hypertrophy. No significant canal stenosis mild foraminal stenosis.  L2-L3: Disc bulge. Facet hypertrophy. No significant canal or foraminal stenosis. L3-L4: Operative level. Endplate osteophytes. Facet hypertrophy. No significant canal stenosis. Mild to moderate foraminal stenosis. L4-L5: Operative level. Endplate osteophytes. Facet hypertrophy. Canal has been decompressed. Foramina are distorted by artifact. Probable marked bilateral foraminal narrowing. L5-S1: Operative level. Endplate osteophytes. Facet hypertrophy. Canal has been decompressed. Foramina distorted by artifact with probable mild stenosis. IMPRESSION: No acute abnormality. Postoperative changes at L3-S1 without evidence of hardware complication. Probable marked bilateral foraminal narrowing at L4-L5 also present on prior MRI. Electronically Signed   By: Macy Mis M.D.   On: 10/01/2021 13:13   MR Lumbar Spine W Wo Contrast  Result Date: 10/01/2021 CLINICAL DATA:  Low back pain, prior surgery, new symptoms Patient also with Fever today. History of back surgery on 07/31/2021 EXAM: MRI LUMBAR SPINE WITHOUT AND WITH CONTRAST TECHNIQUE: Multiplanar and multiecho pulse sequences of the lumbar spine were obtained without and with intravenous contrast. CONTRAST:  7.31m GADAVIST GADOBUTROL 1 MMOL/ML IV SOLN COMPARISON:  MRI 06/12/2021.  CT 10/01/2021 FINDINGS: Segmentation:  Standard. Alignment:  Grade 1 anterolisthesis of L4 on L5 and L5 on S1. Vertebrae: Posterior and interbody fusion at L3-S1. Remote chronic superior endplate compression fracture of T11. No acute fracture. No evidence of discitis. No suspicious bone lesion. Conus medullaris and cauda equina: Conus extends to the T12-L1 level. Conus and cauda equina appear normal. No epidural fluid collection. Paraspinal and other soft tissues: Left paramidline postoperative fluid collection extending from the L2-3 level to the S1 level measuring up to 8.4 x 2.6 x 3.4 cm. Collection is peripherally enhancing on postcontrast sequences. Smaller component of the  collection extends to the right of the L2 spinous process. No prevertebral abnormality. Disc levels: T12-L1: Mild disc bulge. No canal stenosis. Mild bilateral foraminal stenosis. Unchanged. L1-L2: Mild diffuse disc bulge. Mild bilateral facet arthropathy. No canal stenosis. Borderline-mild bilateral foraminal stenosis. Unchanged. L2-L3: Mild annular disc bulge. Mild bilateral facet arthropathy. No canal stenosis. Borderline-mild bilateral foraminal stenosis. Unchanged. L3-L4: Interval fusion. No canal stenosis. Mild-moderate bilateral foraminal stenosis, improved from prior. L4-L5: Interval fusion and decompression on the right. Anterolisthesis with disc uncovering. Persistent low T2 signal nonenhancing structure along the posterior aspect of the left lateral recess resulting in impingement of the descending left L5 nerve root (series 8, image 27), possibly sequestered disc fragment. No canal stenosis. Mild-moderate left foraminal stenosis, improved from prior. No significant residual right-sided foraminal stenosis is evident. L5-S1: Prior fusion and decompression. No evidence of residual impingement. Unchanged. IMPRESSION: 1. No acute osseous abnormality.  No  evidence of discitis. 2. Left paramidline postoperative fluid collection extending from the L2-3 level to the S1 level measuring up to 8.4 x 2.6 x 3.4 cm. Collection is peripherally enhancing on postcontrast sequences, and could be infected. 3. Prior L3-S1 fusion.  No canal stenosis at any level. 4. Mild-moderate bilateral foraminal stenosis at L3-L4 and mild-moderate left foraminal stenosis at L4-5, improved from prior. 5. Persistent low signal nonenhancing structure along the posterior aspect of the left lateral recess resulting in impingement of the descending left L5 nerve root. This may represent a sequestered disc fragment. Electronically Signed   By: Davina Poke D.O.   On: 10/01/2021 16:29   DG Chest Port 1 View  Result Date:  10/01/2021 CLINICAL DATA:  Pain and swelling in legs. EXAM: PORTABLE CHEST 1 VIEW COMPARISON:  April 11, 2021 FINDINGS: The heart size and mediastinal contours are within normal limits. Both lungs are clear. The visualized skeletal structures are unremarkable. IMPRESSION: No active disease. Electronically Signed   By: Dorise Bullion III M.D.   On: 10/01/2021 15:28    Microbiology: Results for orders placed or performed during the hospital encounter of 10/01/21  Culture, blood (routine x 2)     Status: None (Preliminary result)   Collection Time: 10/01/21  1:46 PM   Specimen: BLOOD  Result Value Ref Range Status   Specimen Description BLOOD LEFT ANTECUBITAL  Final   Special Requests   Final    BOTTLES DRAWN AEROBIC AND ANAEROBIC Blood Culture adequate volume   Culture   Final    NO GROWTH < 24 HOURS Performed at [facility], [place] [address] [place], [place] [address]    Report Status PENDING  Incomplete  Resp Panel by RT-PCR (Flu A&B, Covid)     Status: None   Collection Time: 10/01/21  1:46 PM   Specimen: Nasopharyngeal(NP) swabs in vial transport medium  Result Value Ref Range Status   SARS Coronavirus 2 by RT PCR NEGATIVE NEGATIVE Final    Comment: (NOTE) SARS-CoV-2 target nucleic acids are NOT DETECTED.  The SARS-CoV-2 RNA is generally detectable in upper respiratory specimens during the acute phase of infection. The lowest concentration of SARS-CoV-2 viral copies this assay can detect is 138 copies/mL. A negative result does not preclude SARS-Cov-2 infection and should not be used as the sole basis for treatment or other patient management decisions. A negative result may occur with  improper specimen collection/handling, submission of specimen other than nasopharyngeal swab, presence of viral mutation(s) within the areas targeted by this assay, and inadequate number of viral copies(<138 copies/mL). A negative result must be combined with clinical observations,  patient history, and epidemiological information. The expected result is Negative.  Fact Sheet for Patients:  EntrepreneurPulse.com.au  Fact Sheet for Healthcare Providers:  IncredibleEmployment.be  This test is no t yet approved or cleared by the [place] FDA and  has been authorized for detection and/or diagnosis of SARS-CoV-2 by FDA under an Emergency Use Authorization (EUA). This EUA will remain  in effect (meaning this test can be used) for the duration of the COVID-19 declaration under Section 564(b)(1) of the Act, 21 U.S.C.section 360bbb-3(b)(1), unless the authorization is terminated  or revoked sooner.       Influenza A by PCR NEGATIVE NEGATIVE Final   Influenza B by PCR NEGATIVE NEGATIVE Final    Comment: (NOTE) The Xpert Xpress SARS-CoV-2/FLU/RSV plus assay is intended as an aid in the diagnosis of influenza from Nasopharyngeal swab specimens and should not  be used as a sole basis for treatment. Nasal washings and aspirates are unacceptable for Xpert Xpress SARS-CoV-2/FLU/RSV testing.  Fact Sheet for Patients: EntrepreneurPulse.com.au  Fact Sheet for Healthcare Providers: IncredibleEmployment.be  This test is not yet approved or cleared by the [place] FDA and has been authorized for detection and/or diagnosis of SARS-CoV-2 by FDA under an Emergency Use Authorization (EUA). This EUA will remain in effect (meaning this test can be used) for the duration of the COVID-19 declaration under Section 564(b)(1) of the Act, 21 U.S.C. section 360bbb-3(b)(1), unless the authorization is terminated or revoked.  Performed at [facility], [place] [address] [place], [place] [address]   Culture, blood (routine x 2)     Status: None (Preliminary result)   Collection Time: 10/01/21  1:51 PM   Specimen: BLOOD  Result Value Ref Range Status   Specimen Description BLOOD BLOOD RIGHT HAND  Final    Special Requests   Final    BOTTLES DRAWN AEROBIC AND ANAEROBIC Blood Culture adequate volume   Culture   Final    NO GROWTH < 24 HOURS Performed at [facility], [place] [address], [place], [place] [address]    Report Status PENDING  Incomplete    Labs: CBC: Recent Labs  Lab 10/01/21 1346 10/02/21 0102  WBC 6.4 5.4  NEUTROABS 5.4  --   HGB 14.7 12.6*  HCT 46.3 39.5  MCV 92.8 91.2  PLT 244 761   Basic Metabolic Panel: Recent Labs  Lab 10/01/21 1412  NA 140  K 3.8  CL 106  CO2 23  GLUCOSE 98  BUN 22  CREATININE 1.27*  CALCIUM 8.7*   Liver Function Tests: Recent Labs  Lab 10/01/21 1412  AST 33  ALT 19  ALKPHOS 107  BILITOT 1.0  PROT 7.2  ALBUMIN 3.9   CBG: No results for input(s): GLUCAP in the last 168 hours.  Discharge time spent: approximately 25 minutes spent on discharge counseling, evaluation of patient on day of discharge, and coordination of discharge planning with nursing, social work, pharmacy and case management  Signed: Edwin Dada, MD [facility] 10/02/2021

## 2021-10-06 LAB — CULTURE, BLOOD (ROUTINE X 2)
Culture: NO GROWTH
Culture: NO GROWTH
Special Requests: ADEQUATE
Special Requests: ADEQUATE

## 2021-10-20 ENCOUNTER — Emergency Department (HOSPITAL_COMMUNITY): Payer: No Typology Code available for payment source

## 2021-10-20 ENCOUNTER — Emergency Department (HOSPITAL_COMMUNITY)
Admission: EM | Admit: 2021-10-20 | Discharge: 2021-10-20 | Disposition: A | Discharge status: Home / Self Care | Payer: No Typology Code available for payment source | Attending: Emergency Medicine | Admitting: Emergency Medicine

## 2021-10-20 ENCOUNTER — Other Ambulatory Visit: Payer: Self-pay

## 2021-10-20 DIAGNOSIS — R0789 Other chest pain: Secondary | ICD-10-CM

## 2021-10-20 DIAGNOSIS — Z7982 Long term (current) use of aspirin: Secondary | ICD-10-CM | POA: Diagnosis not present

## 2021-10-20 DIAGNOSIS — Z79899 Other long term (current) drug therapy: Secondary | ICD-10-CM | POA: Insufficient documentation

## 2021-10-20 DIAGNOSIS — K219 Gastro-esophageal reflux disease without esophagitis: Secondary | ICD-10-CM | POA: Diagnosis not present

## 2021-10-20 DIAGNOSIS — R7989 Other specified abnormal findings of blood chemistry: Secondary | ICD-10-CM | POA: Insufficient documentation

## 2021-10-20 DIAGNOSIS — I10 Essential (primary) hypertension: Secondary | ICD-10-CM | POA: Insufficient documentation

## 2021-10-20 DIAGNOSIS — Z8546 Personal history of malignant neoplasm of prostate: Secondary | ICD-10-CM | POA: Diagnosis not present

## 2021-10-20 LAB — COMPREHENSIVE METABOLIC PANEL
ALT: 58 U/L — ABNORMAL HIGH (ref 0–44)
AST: 80 U/L — ABNORMAL HIGH (ref 15–41)
Albumin: 3 g/dL — ABNORMAL LOW (ref 3.5–5.0)
Alkaline Phosphatase: 193 U/L — ABNORMAL HIGH (ref 38–126)
Anion gap: 6 (ref 5–15)
BUN: 18 mg/dL (ref 8–23)
CO2: 29 mmol/L (ref 22–32)
Calcium: 8.7 mg/dL — ABNORMAL LOW (ref 8.9–10.3)
Chloride: 105 mmol/L (ref 98–111)
Creatinine, Ser: 1.18 mg/dL (ref 0.61–1.24)
GFR, Estimated: >60 mL/min (ref >60)
Glucose, Bld: 124 mg/dL — ABNORMAL HIGH (ref 70–99)
Potassium: 4 mmol/L (ref 3.5–5.1)
Sodium: 140 mmol/L (ref 135–145)
Total Bilirubin: 0.7 mg/dL (ref 0.3–1.2)
Total Protein: 6.4 g/dL — ABNORMAL LOW (ref 6.5–8.1)

## 2021-10-20 LAB — CBC WITH DIFFERENTIAL/PLATELET
Abs Immature Granulocytes: 0.04 10*3/uL (ref 0.00–0.07)
Basophils Absolute: 0 10*3/uL (ref 0.0–0.1)
Basophils Relative: 0 %
Eosinophils Absolute: 0.1 10*3/uL (ref 0.0–0.5)
Eosinophils Relative: 1 %
HCT: 40.4 % (ref 39.0–52.0)
Hemoglobin: 13.3 g/dL (ref 13.0–17.0)
Immature Granulocytes: 0 %
Lymphocytes Relative: 42 %
Lymphs Abs: 3.9 10*3/uL (ref 0.7–4.0)
MCH: 29.4 pg (ref 26.0–34.0)
MCHC: 32.9 g/dL (ref 30.0–36.0)
MCV: 89.2 fL (ref 80.0–100.0)
Monocytes Absolute: 0.7 10*3/uL (ref 0.1–1.0)
Monocytes Relative: 7 %
Neutro Abs: 4.5 10*3/uL (ref 1.7–7.7)
Neutrophils Relative %: 50 %
Platelets: 178 10*3/uL (ref 150–400)
RBC: 4.53 MIL/uL (ref 4.22–5.81)
RDW: 14.3 % (ref 11.5–15.5)
Smear Review: NORMAL
WBC: 9.3 10*3/uL (ref 4.0–10.5)
nRBC: 0 % (ref 0.0–0.2)

## 2021-10-20 LAB — TROPONIN I (HIGH SENSITIVITY)
Troponin I (High Sensitivity): 16 ng/L (ref <18)
Troponin I (High Sensitivity): 12 ng/L (ref <18)

## 2021-10-20 MED ORDER — LIDOCAINE VISCOUS HCL 2 % MT SOLN
15.0000 mL | Freq: Once | OROMUCOSAL | Status: AC
  Administered 2021-10-20: 15 mL via ORAL
  Filled 2021-10-20: qty 15

## 2021-10-20 MED ORDER — ALUM & MAG HYDROXIDE-SIMETH 200-200-20 MG/5ML PO SUSP
30.0000 mL | Freq: Once | ORAL | Status: AC
  Administered 2021-10-20: 30 mL via ORAL
  Filled 2021-10-20: qty 30

## 2021-10-20 MED ORDER — RABEPRAZOLE SODIUM 20 MG PO TBEC: 20.0000 mg | DELAYED_RELEASE_TABLET | Freq: Two times a day (BID) | ORAL | 0 refills | Status: DC

## 2021-10-20 MED ORDER — RABEPRAZOLE SODIUM 20 MG PO TBEC
20.0000 mg | DELAYED_RELEASE_TABLET | Freq: Two times a day (BID) | ORAL | 0 refills | Status: DC
Start: 2021-10-20 — End: 2021-10-20

## 2021-10-20 MED ORDER — FAMOTIDINE IN NACL 20-0.9 MG/50ML-% IV SOLN
20.0000 mg | Freq: Once | INTRAVENOUS | Status: AC
  Administered 2021-10-20: 20 mg via INTRAVENOUS
  Filled 2021-10-20: qty 50

## 2021-10-20 NOTE — ED Provider Notes (Signed)
[facility] EMERGENCY DEPARTMENT Provider Note   CSN: [id number] Arrival date & time: 10/20/21  1002     History  Chief Complaint  Patient presents with   Chest Pain   Gastroesophageal Reflux    Alexander Cox is a 76 y.o. male.  Pt is a 76 yo male with a pmhx significant for Barrett's esophagus, GERD, HTN, high cholesterol, kidney stones, prostate cancer, pulmonary hypertension, DDD, and syncope.  Pt said he started taking doxy about 4 days ago for a tick bite.  Yesterday, he started feeling some pain in his chest.  He said it is a burning pain and goes up into his chest.  He has been belching a lot.  He has been having trouble swallowing, but is able to swallow.  He has an appt with GI in July for an endoscopy.         Home Medications Prior to Admission medications   Medication Sig Start Date End Date Taking? Authorizing Provider  albuterol (VENTOLIN HFA) 108 (90 Base) MCG/ACT inhaler Inhale 1-2 puffs into the lungs every 6 (six) hours as needed for wheezing or shortness of breath. 02/17/20   Lannie Fields, PA-C  aspirin EC 81 MG tablet Take 81 mg by mouth daily.    [provider]  calcium carbonate (OS-CAL) 600 MG TABS tablet Take 600 mg by mouth daily with breakfast.    [provider]  cholecalciferol (VITAMIN D) 25 MCG (1000 UT) tablet Take 1,000 Units by mouth daily.    [provider]  Coenzyme Q10 (COQ10) 200 MG CAPS Take 200 mg by mouth daily.    [provider]  gabapentin (NEURONTIN) 300 MG capsule Take 300 mg by mouth 4 (four) times daily. 11/25/18   [provider]  hydrochlorothiazide (HYDRODIURIL) 25 MG tablet Take 25 mg by mouth daily.  11/17/13   [provider]  lisinopril (ZESTRIL) 10 MG tablet Take 10 mg by mouth daily. 11/09/18   [provider]  Multiple Vitamin (MULTIVITAMIN WITH MINERALS) TABS tablet Take 1 tablet by mouth daily.    [provider]  Omega-3 Fatty  Acids (FISH OIL) 1000 MG CAPS Take 1,000 mg by mouth daily.    [provider]  ondansetron (ZOFRAN) 4 MG tablet Take 1 tablet (4 mg total) by mouth every 8 (eight) hours as needed for nausea or vomiting. 02/17/20   Lannie Fields, PA-C  pravastatin (PRAVACHOL) 10 MG tablet Take 10 mg by mouth daily. 11/23/18   [provider]  RABEprazole (ACIPHEX) 20 MG tablet Take 1 tablet (20 mg total) by mouth 2 (two) times daily. 10/20/21   Isla Pence, MD  Tetrahydroz-Polyvinyl Al-Povid (CLEAR EYES TRIPLE ACTION) 0.05-0.5-0.6 % SOLN Place 1 drop into both eyes 3 (three) times daily as needed (dry/irritated eyes.).    [provider]      Allergies    Gluten meal    Review of Systems   Review of Systems  Cardiovascular:  Positive for chest pain.  Gastrointestinal:  Positive for abdominal pain.  All other systems reviewed and are negative.   Physical Exam Updated Vital Signs BP (!) 148/82   Pulse (!) 58   Temp 98.1 F (36.7 C) (Oral)   Resp 17   SpO2 99%  Physical Exam Vitals and nursing note reviewed.  Constitutional:      Appearance: He is well-developed. He is obese.  HENT:     Head: Normocephalic and atraumatic.  Eyes:  Extraocular Movements: Extraocular movements intact.     Pupils: Pupils are equal, round, and reactive to light.  Cardiovascular:     Rate and Rhythm: Normal rate and regular rhythm.     Heart sounds: Normal heart sounds.  Pulmonary:     Effort: Pulmonary effort is normal.     Breath sounds: Normal breath sounds.  Abdominal:     General: Bowel sounds are normal.     Palpations: Abdomen is soft.  Musculoskeletal:        General: Normal range of motion.     Cervical back: Normal range of motion and neck supple.  Skin:    General: Skin is warm.     Capillary Refill: Capillary refill takes less than 2 seconds.  Neurological:     General: No focal deficit present.     Mental Status: He is alert and oriented to person, place, and  time.  Psychiatric:        Mood and Affect: Mood normal.        Behavior: Behavior normal.     ED Results / Procedures / Treatments   Labs (all labs ordered are listed, but only abnormal results are displayed) Labs Reviewed  COMPREHENSIVE METABOLIC PANEL - Abnormal; Notable for the following components:      Result Value   Glucose, Bld 124 (*)    Calcium 8.7 (*)    Total Protein 6.4 (*)    Albumin 3.0 (*)    AST 80 (*)    ALT 58 (*)    Alkaline Phosphatase 193 (*)    All other components within normal limits  CBC WITH DIFFERENTIAL/PLATELET  TROPONIN I (HIGH SENSITIVITY)  TROPONIN I (HIGH SENSITIVITY)    EKG EKG Interpretation  Date/Time:  Friday October 20 2021 10:22:13 EDT Ventricular Rate:  67 PR Interval:  144 QRS Duration: 80 QT Interval:  412 QTC Calculation: 435 R Axis:   -15 Text Interpretation: Normal sinus rhythm Nonspecific ST and T wave abnormality Abnormal ECG When compared with ECG of 27-Nov-2017 14:27, PREVIOUS ECG IS PRESENT No significant change since last tracing Confirmed by Isla Pence [phone]) on 10/20/2021 11:16:39 AM  Radiology DG Chest 2 View  Result Date: 10/20/2021 CLINICAL DATA:  Chest pain. EXAM: CHEST - 2 VIEW COMPARISON:  Chest XR, most recently 10/15/2021. CT chest, 11/30/2013. FINDINGS: Cardiomediastinal silhouette is within normal limits. Mild aortic arch atherosclerosis. Lungs are well inflated. No focal consolidation or mass. No pleural effusion or pneumothorax. Mild degenerative change of imaged thoracic spine. No acute displaced fracture. IMPRESSION: 1. No active cardiopulmonary disease. 2.  Aortic Atherosclerosis (ICD10-I70.0). Electronically Signed   By: Michaelle Birks M.D.   On: 10/20/2021 10:53    Procedures Procedures    Medications Ordered in ED Medications  alum & mag hydroxide-simeth (MAALOX/MYLANTA) 200-200-20 MG/5ML suspension 30 mL (30 mLs Oral Given 10/20/21 1150)    And  lidocaine (XYLOCAINE) 2 % viscous mouth solution 15 mL  (15 mLs Oral Given 10/20/21 1150)  famotidine (PEPCID) IVPB 20 mg premix (0 mg Intravenous Stopped 10/20/21 1314)    ED Course/ Medical Decision Making/ A&P                           Medical Decision Making Risk OTC drugs. Prescription drug management.   This patient presents to the ED for concern of chest pain, this involves an extensive number of treatment options, and is a complaint that carries with it a high risk  of complications and morbidity.  The differential diagnosis includes gerd, cad, pe   Co morbidities that complicate the patient evaluation  Barrett's esophagus, GERD, HTN, high cholesterol, kidney stones, prostate cancer, pulmonary hypertension, DDD, and syncope   Additional history obtained:  Additional history obtained from epic chart review External records from outside source obtained and reviewed including wife   Lab Tests:  I Ordered, and personally interpreted labs.  The pertinent results include:  cbc nl, cmp nl other than slight elevation in lfts (bilirubin nl), trop nl   Imaging Studies ordered:  I ordered imaging studies including CXR  I independently visualized and interpreted imaging which showed  IMPRESSION:  1. No active cardiopulmonary disease.  2.  Aortic Atherosclerosis (ICD10-I70.0).   I agree with the radiologist interpretation   Cardiac Monitoring:  The patient was maintained on a cardiac monitor.  I personally viewed and interpreted the cardiac monitored which showed an underlying rhythm of: nsr   Medicines ordered and prescription drug management:  I ordered medication including gi cocktail and pepcid  for pain  Reevaluation of the patient after these medicines showed that the patient improved I have reviewed the patients home medicines and have made adjustments as needed  Critical Interventions:  Trop/ekg/pain control   Problem List / ED Course:  Atypical cp:  likely due to GERD.  I will increase pt's rabeprazole to bid.   Pt has an appt with GI in July.  He is told to call and see if there are any earlier appts available.  He may be developing a stricture as he reports some trouble swallowing.   Reevaluation:  After the interventions noted above, I reevaluated the patient and found that they have :improved   Social Determinants of Health:  Lives at home with wife   Dispostion:  After consideration of the diagnostic results and the patients response to treatment, I feel that the patent would benefit from discharge with outpatient f/u.          Final Clinical Impression(s) / ED Diagnoses Final diagnoses:  Atypical chest pain  Gastroesophageal reflux disease, unspecified whether esophagitis present    Rx / DC Orders ED Discharge Orders          Ordered    RABEprazole (ACIPHEX) 20 MG tablet  2 times daily        10/20/21 1453              Isla Pence, MD 10/20/21 1459

## 2021-10-20 NOTE — ED Triage Notes (Signed)
Pt/ stated, I started yesterday with chest pain and indigestion. I started on Doxycycline from a tick bite on Monday.

## 2021-10-20 NOTE — ED Provider Triage Note (Signed)
Emergency Medicine Provider Triage Evaluation Note  DESTIN VINSANT , a 76 y.o. male  was evaluated in triage.  Pt complains of burning chest pain, belching, and trouble swallowing.  He reports he thinks this is his GERD.  He has been recently placed on doxycycline for tick bite.  Denies any shortness of breath..  Review of Systems  Positive:  Negative:   Physical Exam  BP 125/65 (BP Location: Left Arm)   Pulse 65   Temp 98.1 F (36.7 C) (Oral)   Resp 18   SpO2 97%  Gen:   Awake, no distress   Resp:  Normal effort  MSK:   Moves extremities without difficulty  Other:  Regular rate and rhythm  Medical Decision Making  Medically screening exam initiated at 10:31 AM.  Appropriate orders placed.  Lucita Lora Curci was informed that the remainder of the evaluation will be completed by another provider, this initial triage assessment does not replace that evaluation, and the importance of remaining in the ED until their evaluation is complete.  This sounds like GERD, however given age, cannot rule out MI.  Will order basic labs and troponin.   Sherrell Puller, PA-C 10/20/21 1032

## 2022-06-06 DIAGNOSIS — R350 Frequency of micturition: Secondary | ICD-10-CM | POA: Diagnosis not present

## 2022-06-06 DIAGNOSIS — R351 Nocturia: Secondary | ICD-10-CM | POA: Diagnosis not present

## 2022-06-06 DIAGNOSIS — R3912 Poor urinary stream: Secondary | ICD-10-CM | POA: Diagnosis not present

## 2022-06-06 DIAGNOSIS — Z8546 Personal history of malignant neoplasm of prostate: Secondary | ICD-10-CM | POA: Diagnosis not present

## 2022-06-11 DIAGNOSIS — K080 Exfoliation of teeth due to systemic causes: Secondary | ICD-10-CM | POA: Diagnosis not present

## 2022-06-26 DIAGNOSIS — R059 Cough, unspecified: Secondary | ICD-10-CM | POA: Diagnosis not present

## 2022-06-26 DIAGNOSIS — B349 Viral infection, unspecified: Secondary | ICD-10-CM | POA: Diagnosis not present

## 2022-06-26 DIAGNOSIS — J9801 Acute bronchospasm: Secondary | ICD-10-CM | POA: Diagnosis not present

## 2022-06-26 DIAGNOSIS — Z03818 Encounter for observation for suspected exposure to other biological agents ruled out: Secondary | ICD-10-CM | POA: Diagnosis not present

## 2022-07-09 DIAGNOSIS — N1831 Chronic kidney disease, stage 3a: Secondary | ICD-10-CM | POA: Diagnosis not present

## 2022-07-09 DIAGNOSIS — B974 Respiratory syncytial virus as the cause of diseases classified elsewhere: Secondary | ICD-10-CM | POA: Diagnosis not present

## 2022-07-09 DIAGNOSIS — I10 Essential (primary) hypertension: Secondary | ICD-10-CM | POA: Diagnosis not present

## 2022-08-31 DIAGNOSIS — Z8546 Personal history of malignant neoplasm of prostate: Secondary | ICD-10-CM | POA: Diagnosis not present

## 2022-09-04 ENCOUNTER — Other Ambulatory Visit: Payer: Self-pay | Admitting: Family Medicine

## 2022-09-04 DIAGNOSIS — N183 Chronic kidney disease, stage 3 unspecified: Secondary | ICD-10-CM | POA: Diagnosis not present

## 2022-09-04 DIAGNOSIS — Z0000 Encounter for general adult medical examination without abnormal findings: Secondary | ICD-10-CM | POA: Diagnosis not present

## 2022-09-04 DIAGNOSIS — D696 Thrombocytopenia, unspecified: Secondary | ICD-10-CM | POA: Diagnosis not present

## 2022-09-04 DIAGNOSIS — K2270 Barrett's esophagus without dysplasia: Secondary | ICD-10-CM | POA: Diagnosis not present

## 2022-09-04 DIAGNOSIS — I10 Essential (primary) hypertension: Secondary | ICD-10-CM | POA: Diagnosis not present

## 2022-09-04 DIAGNOSIS — M810 Age-related osteoporosis without current pathological fracture: Secondary | ICD-10-CM

## 2022-09-04 DIAGNOSIS — R7303 Prediabetes: Secondary | ICD-10-CM | POA: Diagnosis not present

## 2022-09-04 DIAGNOSIS — E7800 Pure hypercholesterolemia, unspecified: Secondary | ICD-10-CM | POA: Diagnosis not present

## 2022-09-05 DIAGNOSIS — R3912 Poor urinary stream: Secondary | ICD-10-CM | POA: Diagnosis not present

## 2022-09-05 DIAGNOSIS — Z8546 Personal history of malignant neoplasm of prostate: Secondary | ICD-10-CM | POA: Diagnosis not present

## 2022-09-05 DIAGNOSIS — R350 Frequency of micturition: Secondary | ICD-10-CM | POA: Diagnosis not present

## 2022-09-05 DIAGNOSIS — N403 Nodular prostate with lower urinary tract symptoms: Secondary | ICD-10-CM | POA: Diagnosis not present

## 2022-09-14 ENCOUNTER — Ambulatory Visit
Admission: RE | Admit: 2022-09-14 | Discharge: 2022-09-14 | Disposition: A | Discharge status: Home / Self Care | Payer: Medicare Other | Source: Ambulatory Visit | Attending: Family Medicine | Admitting: Family Medicine

## 2022-09-14 DIAGNOSIS — C61 Malignant neoplasm of prostate: Secondary | ICD-10-CM | POA: Diagnosis not present

## 2022-09-14 DIAGNOSIS — Z8262 Family history of osteoporosis: Secondary | ICD-10-CM | POA: Diagnosis not present

## 2022-09-14 DIAGNOSIS — M8588 Other specified disorders of bone density and structure, other site: Secondary | ICD-10-CM | POA: Diagnosis not present

## 2022-09-14 DIAGNOSIS — M810 Age-related osteoporosis without current pathological fracture: Secondary | ICD-10-CM

## 2022-09-14 DIAGNOSIS — N189 Chronic kidney disease, unspecified: Secondary | ICD-10-CM | POA: Diagnosis not present

## 2022-12-20 DIAGNOSIS — K080 Exfoliation of teeth due to systemic causes: Secondary | ICD-10-CM | POA: Diagnosis not present

## 2023-01-03 DIAGNOSIS — H43812 Vitreous degeneration, left eye: Secondary | ICD-10-CM | POA: Diagnosis not present

## 2023-01-03 DIAGNOSIS — D3131 Benign neoplasm of right choroid: Secondary | ICD-10-CM | POA: Diagnosis not present

## 2023-01-03 DIAGNOSIS — Z961 Presence of intraocular lens: Secondary | ICD-10-CM | POA: Diagnosis not present

## 2023-01-03 DIAGNOSIS — H04123 Dry eye syndrome of bilateral lacrimal glands: Secondary | ICD-10-CM | POA: Diagnosis not present

## 2023-01-03 DIAGNOSIS — H26493 Other secondary cataract, bilateral: Secondary | ICD-10-CM | POA: Diagnosis not present

## 2023-02-13 DIAGNOSIS — N179 Acute kidney failure, unspecified: Secondary | ICD-10-CM | POA: Diagnosis not present

## 2023-02-13 DIAGNOSIS — M810 Age-related osteoporosis without current pathological fracture: Secondary | ICD-10-CM | POA: Diagnosis not present

## 2023-02-13 DIAGNOSIS — N183 Chronic kidney disease, stage 3 unspecified: Secondary | ICD-10-CM | POA: Diagnosis not present

## 2023-02-27 DIAGNOSIS — M810 Age-related osteoporosis without current pathological fracture: Secondary | ICD-10-CM | POA: Diagnosis not present

## 2023-03-13 DIAGNOSIS — R7303 Prediabetes: Secondary | ICD-10-CM | POA: Diagnosis not present

## 2023-03-13 DIAGNOSIS — E7800 Pure hypercholesterolemia, unspecified: Secondary | ICD-10-CM | POA: Diagnosis not present

## 2023-03-13 DIAGNOSIS — I10 Essential (primary) hypertension: Secondary | ICD-10-CM | POA: Diagnosis not present

## 2023-03-13 DIAGNOSIS — N183 Chronic kidney disease, stage 3 unspecified: Secondary | ICD-10-CM | POA: Diagnosis not present

## 2023-03-15 ENCOUNTER — Other Ambulatory Visit: Payer: Self-pay | Admitting: Family Medicine

## 2023-03-15 DIAGNOSIS — R748 Abnormal levels of other serum enzymes: Secondary | ICD-10-CM

## 2023-03-18 ENCOUNTER — Ambulatory Visit
Admission: RE | Admit: 2023-03-18 | Discharge: 2023-03-18 | Disposition: A | Discharge status: Home / Self Care | Payer: Medicare Other | Source: Ambulatory Visit | Attending: Family Medicine | Admitting: Family Medicine

## 2023-03-18 DIAGNOSIS — R748 Abnormal levels of other serum enzymes: Secondary | ICD-10-CM

## 2023-03-18 DIAGNOSIS — R945 Abnormal results of liver function studies: Secondary | ICD-10-CM | POA: Diagnosis not present

## 2023-04-17 DIAGNOSIS — K080 Exfoliation of teeth due to systemic causes: Secondary | ICD-10-CM | POA: Diagnosis not present

## 2023-04-19 DIAGNOSIS — R945 Abnormal results of liver function studies: Secondary | ICD-10-CM | POA: Diagnosis not present

## 2023-04-22 DIAGNOSIS — K080 Exfoliation of teeth due to systemic causes: Secondary | ICD-10-CM | POA: Diagnosis not present

## 2023-05-11 ENCOUNTER — Encounter (HOSPITAL_COMMUNITY): Payer: Self-pay | Admitting: *Deleted

## 2023-05-11 ENCOUNTER — Other Ambulatory Visit: Payer: Self-pay

## 2023-05-11 ENCOUNTER — Emergency Department (HOSPITAL_COMMUNITY): Payer: No Typology Code available for payment source

## 2023-05-11 ENCOUNTER — Observation Stay (HOSPITAL_COMMUNITY)
Admission: EM | Admit: 2023-05-11 | Discharge: 2023-05-12 | Disposition: A | Discharge status: Home / Self Care | Payer: No Typology Code available for payment source | Attending: Internal Medicine | Admitting: Internal Medicine

## 2023-05-11 DIAGNOSIS — R079 Chest pain, unspecified: Secondary | ICD-10-CM | POA: Diagnosis not present

## 2023-05-11 DIAGNOSIS — Z87891 Personal history of nicotine dependence: Secondary | ICD-10-CM | POA: Diagnosis not present

## 2023-05-11 DIAGNOSIS — R062 Wheezing: Secondary | ICD-10-CM | POA: Diagnosis not present

## 2023-05-11 DIAGNOSIS — I491 Atrial premature depolarization: Secondary | ICD-10-CM | POA: Diagnosis not present

## 2023-05-11 DIAGNOSIS — J441 Chronic obstructive pulmonary disease with (acute) exacerbation: Secondary | ICD-10-CM | POA: Diagnosis not present

## 2023-05-11 DIAGNOSIS — I5032 Chronic diastolic (congestive) heart failure: Secondary | ICD-10-CM | POA: Insufficient documentation

## 2023-05-11 DIAGNOSIS — K219 Gastro-esophageal reflux disease without esophagitis: Secondary | ICD-10-CM | POA: Insufficient documentation

## 2023-05-11 DIAGNOSIS — I2510 Atherosclerotic heart disease of native coronary artery without angina pectoris: Secondary | ICD-10-CM | POA: Diagnosis not present

## 2023-05-11 DIAGNOSIS — Z79899 Other long term (current) drug therapy: Secondary | ICD-10-CM | POA: Insufficient documentation

## 2023-05-11 DIAGNOSIS — Z7901 Long term (current) use of anticoagulants: Secondary | ICD-10-CM | POA: Insufficient documentation

## 2023-05-11 DIAGNOSIS — E785 Hyperlipidemia, unspecified: Secondary | ICD-10-CM | POA: Insufficient documentation

## 2023-05-11 DIAGNOSIS — N400 Enlarged prostate without lower urinary tract symptoms: Secondary | ICD-10-CM | POA: Insufficient documentation

## 2023-05-11 DIAGNOSIS — I110 Hypertensive heart disease with heart failure: Secondary | ICD-10-CM | POA: Insufficient documentation

## 2023-05-11 DIAGNOSIS — F1090 Alcohol use, unspecified, uncomplicated: Secondary | ICD-10-CM | POA: Insufficient documentation

## 2023-05-11 DIAGNOSIS — R0902 Hypoxemia: Secondary | ICD-10-CM | POA: Diagnosis not present

## 2023-05-11 DIAGNOSIS — I700 Atherosclerosis of aorta: Secondary | ICD-10-CM | POA: Diagnosis not present

## 2023-05-11 DIAGNOSIS — M5416 Radiculopathy, lumbar region: Secondary | ICD-10-CM | POA: Insufficient documentation

## 2023-05-11 DIAGNOSIS — Z1152 Encounter for screening for COVID-19: Secondary | ICD-10-CM | POA: Diagnosis not present

## 2023-05-11 DIAGNOSIS — R0602 Shortness of breath: Secondary | ICD-10-CM | POA: Diagnosis not present

## 2023-05-11 DIAGNOSIS — I517 Cardiomegaly: Secondary | ICD-10-CM | POA: Diagnosis not present

## 2023-05-11 LAB — COMPREHENSIVE METABOLIC PANEL
ALT: 30 U/L (ref 0–44)
AST: 43 U/L — ABNORMAL HIGH (ref 15–41)
Albumin: 3.9 g/dL (ref 3.5–5.0)
Alkaline Phosphatase: 75 U/L (ref 38–126)
Anion gap: 13 (ref 5–15)
BUN: 20 mg/dL (ref 8–23)
CO2: 23 mmol/L (ref 22–32)
Calcium: 8.6 mg/dL — ABNORMAL LOW (ref 8.9–10.3)
Chloride: 107 mmol/L (ref 98–111)
Creatinine, Ser: 1.5 mg/dL — ABNORMAL HIGH (ref 0.61–1.24)
GFR, Estimated: 48 mL/min — ABNORMAL LOW (ref >60)
Glucose, Bld: 122 mg/dL — ABNORMAL HIGH (ref 70–99)
Potassium: 3.3 mmol/L — ABNORMAL LOW (ref 3.5–5.1)
Sodium: 143 mmol/L (ref 135–145)
Total Bilirubin: 0.9 mg/dL (ref <1.2)
Total Protein: 6.9 g/dL (ref 6.5–8.1)

## 2023-05-11 LAB — I-STAT VENOUS BLOOD GAS, ED
Acid-Base Excess: 0 mmol/L (ref 0.0–2.0)
Bicarbonate: 26.9 mmol/L (ref 20.0–28.0)
Calcium, Ion: 1.1 mmol/L — ABNORMAL LOW (ref 1.15–1.40)
HCT: 42 % (ref 39.0–52.0)
Hemoglobin: 14.3 g/dL (ref 13.0–17.0)
O2 Saturation: 87 %
Potassium: 3 mmol/L — ABNORMAL LOW (ref 3.5–5.1)
Sodium: 143 mmol/L (ref 135–145)
TCO2: 28 mmol/L (ref 22–32)
pCO2, Ven: 51.2 mm[Hg] (ref 44–60)
pH, Ven: 7.329 (ref 7.25–7.43)
pO2, Ven: 57 mm[Hg] — ABNORMAL HIGH (ref 32–45)

## 2023-05-11 LAB — CBC WITH DIFFERENTIAL/PLATELET
Abs Immature Granulocytes: 0.07 10*3/uL (ref 0.00–0.07)
Basophils Absolute: 0 10*3/uL (ref 0.0–0.1)
Basophils Relative: 0 %
Eosinophils Absolute: 0.1 10*3/uL (ref 0.0–0.5)
Eosinophils Relative: 1 %
HCT: 44.9 % (ref 39.0–52.0)
Hemoglobin: 15 g/dL (ref 13.0–17.0)
Immature Granulocytes: 1 %
Lymphocytes Relative: 31 %
Lymphs Abs: 3.2 10*3/uL (ref 0.7–4.0)
MCH: 30 pg (ref 26.0–34.0)
MCHC: 33.4 g/dL (ref 30.0–36.0)
MCV: 89.8 fL (ref 80.0–100.0)
Monocytes Absolute: 0.6 10*3/uL (ref 0.1–1.0)
Monocytes Relative: 6 %
Neutro Abs: 6.3 10*3/uL (ref 1.7–7.7)
Neutrophils Relative %: 61 %
Platelets: 196 10*3/uL (ref 150–400)
RBC: 5 MIL/uL (ref 4.22–5.81)
RDW: 13.1 % (ref 11.5–15.5)
WBC: 10.3 10*3/uL (ref 4.0–10.5)
nRBC: 0 % (ref 0.0–0.2)

## 2023-05-11 LAB — RESP PANEL BY RT-PCR (RSV, FLU A&B, COVID)  RVPGX2
Influenza A by PCR: NEGATIVE
Influenza B by PCR: NEGATIVE
Resp Syncytial Virus by PCR: NEGATIVE
SARS Coronavirus 2 by RT PCR: NEGATIVE

## 2023-05-11 LAB — RESPIRATORY PANEL BY PCR

## 2023-05-11 LAB — TROPONIN I (HIGH SENSITIVITY)
Troponin I (High Sensitivity): 23 ng/L — ABNORMAL HIGH (ref <18)
Troponin I (High Sensitivity): 57 ng/L — ABNORMAL HIGH (ref <18)

## 2023-05-11 MED ORDER — ADULT MULTIVITAMIN W/MINERALS CH
1.0000 | ORAL_TABLET | Freq: Every day | ORAL | Status: DC
  Administered 2023-05-11 – 2023-05-12 (×2): 1 via ORAL
  Filled 2023-05-11 (×2): qty 1

## 2023-05-11 MED ORDER — SODIUM CHLORIDE 0.9 % IV BOLUS
1000.0000 mL | Freq: Once | INTRAVENOUS | Status: AC
  Administered 2023-05-11: 1000 mL via INTRAVENOUS

## 2023-05-11 MED ORDER — ENOXAPARIN SODIUM 40 MG/0.4ML IJ SOSY
40.0000 mg | PREFILLED_SYRINGE | INTRAMUSCULAR | Status: DC
  Administered 2023-05-11: 40 mg via SUBCUTANEOUS
  Filled 2023-05-11: qty 0.4

## 2023-05-11 MED ORDER — HYDROCHLOROTHIAZIDE 12.5 MG PO TABS
12.5000 mg | ORAL_TABLET | Freq: Every day | ORAL | Status: DC
Start: 2023-05-11 — End: 2023-05-12
  Administered 2023-05-11 – 2023-05-12 (×2): 12.5 mg via ORAL
  Filled 2023-05-11 (×2): qty 1

## 2023-05-11 MED ORDER — IPRATROPIUM-ALBUTEROL 0.5-2.5 (3) MG/3ML IN SOLN
3.0000 mL | Freq: Four times a day (QID) | RESPIRATORY_TRACT | Status: DC | PRN
  Administered 2023-05-11: 3 mL via RESPIRATORY_TRACT
  Filled 2023-05-11: qty 6

## 2023-05-11 MED ORDER — CALCIUM CARBONATE 1250 (500 CA) MG PO TABS
625.0000 mg | ORAL_TABLET | Freq: Every day | ORAL | Status: DC
  Administered 2023-05-12: 625 mg via ORAL
  Filled 2023-05-11: qty 1

## 2023-05-11 MED ORDER — TAMSULOSIN HCL 0.4 MG PO CAPS
0.4000 mg | ORAL_CAPSULE | Freq: Every day | ORAL | Status: DC
  Administered 2023-05-11: 0.4 mg via ORAL
  Filled 2023-05-11: qty 1

## 2023-05-11 MED ORDER — COQ10 200 MG PO CAPS: 200.0000 mg | ORAL_CAPSULE | Freq: Every day | ORAL | Status: DC

## 2023-05-11 MED ORDER — PANTOPRAZOLE SODIUM 40 MG PO TBEC
40.0000 mg | DELAYED_RELEASE_TABLET | Freq: Every day | ORAL | Status: DC
Start: 2023-05-11 — End: 2023-05-12
  Administered 2023-05-11 – 2023-05-12 (×2): 40 mg via ORAL
  Filled 2023-05-11 (×2): qty 1

## 2023-05-11 MED ORDER — PRAVASTATIN SODIUM 40 MG PO TABS
20.0000 mg | ORAL_TABLET | Freq: Every day | ORAL | Status: DC
  Administered 2023-05-11 – 2023-05-12 (×2): 20 mg via ORAL
  Filled 2023-05-11: qty 1
  Filled 2023-05-11: qty 2

## 2023-05-11 MED ORDER — VITAMIN B-12 1000 MCG PO TABS
500.0000 ug | ORAL_TABLET | Freq: Every day | ORAL | Status: DC
  Administered 2023-05-11 – 2023-05-12 (×2): 500 ug via ORAL
  Filled 2023-05-11 (×2): qty 1

## 2023-05-11 MED ORDER — VITAMIN E 180 MG (400 UNIT) PO CAPS
400.0000 [IU] | ORAL_CAPSULE | Freq: Every day | ORAL | Status: DC
  Administered 2023-05-11 – 2023-05-12 (×2): 400 [IU] via ORAL
  Filled 2023-05-11 (×2): qty 1

## 2023-05-11 MED ORDER — PREGABALIN 75 MG PO CAPS
225.0000 mg | ORAL_CAPSULE | Freq: Two times a day (BID) | ORAL | Status: DC
  Administered 2023-05-11 – 2023-05-12 (×3): 225 mg via ORAL
  Filled 2023-05-11 (×2): qty 3
  Filled 2023-05-11: qty 1

## 2023-05-11 MED ORDER — MOMETASONE FURO-FORMOTEROL FUM 200-5 MCG/ACT IN AERO
2.0000 | INHALATION_SPRAY | Freq: Two times a day (BID) | RESPIRATORY_TRACT | Status: DC
  Administered 2023-05-11 – 2023-05-12 (×2): 2 via RESPIRATORY_TRACT
  Filled 2023-05-11 (×2): qty 8.8

## 2023-05-11 MED ORDER — MAGNESIUM SULFATE 2 GM/50ML IV SOLN
2.0000 g | Freq: Once | INTRAVENOUS | Status: AC
  Administered 2023-05-11: 2 g via INTRAVENOUS
  Filled 2023-05-11: qty 50

## 2023-05-11 MED ORDER — ONDANSETRON HCL 4 MG PO TABS: 4.0000 mg | ORAL_TABLET | Freq: Four times a day (QID) | ORAL | Status: DC | PRN

## 2023-05-11 MED ORDER — IOHEXOL 350 MG/ML SOLN
65.0000 mL | Freq: Once | INTRAVENOUS | Status: AC | PRN
  Administered 2023-05-11: 65 mL via INTRAVENOUS

## 2023-05-11 MED ORDER — METOPROLOL SUCCINATE ER 50 MG PO TB24
50.0000 mg | ORAL_TABLET | Freq: Every day | ORAL | Status: DC
  Administered 2023-05-11 – 2023-05-12 (×2): 50 mg via ORAL
  Filled 2023-05-11: qty 2
  Filled 2023-05-11: qty 1

## 2023-05-11 MED ORDER — VITAMIN D 25 MCG (1000 UNIT) PO TABS
1000.0000 [IU] | ORAL_TABLET | Freq: Every day | ORAL | Status: DC
  Administered 2023-05-11 – 2023-05-12 (×2): 1000 [IU] via ORAL
  Filled 2023-05-11 (×2): qty 1

## 2023-05-11 MED ORDER — ACETAMINOPHEN 325 MG PO TABS: 650.0000 mg | ORAL_TABLET | Freq: Four times a day (QID) | ORAL | Status: DC | PRN

## 2023-05-11 MED ORDER — PREDNISONE 20 MG PO TABS
40.0000 mg | ORAL_TABLET | Freq: Every day | ORAL | Status: DC
  Administered 2023-05-12: 40 mg via ORAL
  Filled 2023-05-11: qty 2

## 2023-05-11 MED ORDER — SENNOSIDES-DOCUSATE SODIUM 8.6-50 MG PO TABS: 1.0000 | ORAL_TABLET | Freq: Every evening | ORAL | Status: DC | PRN

## 2023-05-11 MED ORDER — ASPIRIN 81 MG PO TBEC
81.0000 mg | DELAYED_RELEASE_TABLET | Freq: Every day | ORAL | Status: DC
  Administered 2023-05-11 – 2023-05-12 (×2): 81 mg via ORAL
  Filled 2023-05-11 (×2): qty 1

## 2023-05-11 MED ORDER — ACETAMINOPHEN 650 MG RE SUPP: 650.0000 mg | Freq: Four times a day (QID) | RECTAL | Status: DC | PRN

## 2023-05-11 MED ORDER — ONDANSETRON HCL 4 MG/2ML IJ SOLN: 4.0000 mg | Freq: Four times a day (QID) | INTRAMUSCULAR | Status: DC | PRN

## 2023-05-11 MED ORDER — OMEGA-3-ACID ETHYL ESTERS 1 G PO CAPS
1.0000 g | ORAL_CAPSULE | Freq: Every day | ORAL | Status: DC
  Administered 2023-05-11 – 2023-05-12 (×2): 1 g via ORAL
  Filled 2023-05-11 (×2): qty 1

## 2023-05-11 MED ORDER — SODIUM CHLORIDE 0.9 % IV SOLN
500.0000 mg | INTRAVENOUS | Status: AC
  Administered 2023-05-11: 500 mg via INTRAVENOUS
  Filled 2023-05-11: qty 5

## 2023-05-11 MED ORDER — AZITHROMYCIN 250 MG PO TABS
500.0000 mg | ORAL_TABLET | Freq: Every day | ORAL | Status: DC
  Filled 2023-05-11: qty 2

## 2023-05-11 NOTE — ED Notes (Signed)
ED TO INPATIENT HANDOFF REPORT  ED Nurse Name and Phone #: Richarda Osmond Name/Age/Gender Alexander Cox 77 y.o. male Room/Bed: 038C/038C  Code Status   Code Status: Full Code  Home/SNF/Other Home Patient oriented to: self, place, time, and situation Is this baseline? Yes   Triage Complete: Triage complete  Chief Complaint COPD exacerbation (HCC) [J44.1]  Triage Note Pt arrives via [facility], with reports by them that he has had 2 days of SOB. Pt with hx of COPD, used his albuterol inhaler at home without relief. Denies Pain. On arrival by EMS at the scene, pt having diffuse wheezing, saturations 84% on RA. Pt currently receiving his second duoneb on arrival. IV established in the left hand, he was given solumedrol 125. Noted to be in trigemeny on monitor, no cardiac history reported. Pt reporting some relief with his duoneb   Allergies Allergies  Allergen Reactions   Gluten Meal Other (See Comments)    Upset stomach    Level of Care/Admitting Diagnosis ED Disposition     ED Disposition  Admit   Condition  --   Comment  Hospital Area: [facility] [[id number]]  Level of Care: Med-Surg [16]  May place patient in observation at [facility] or [facility] if equivalent level of care is available:: No  Covid Evaluation: Confirmed COVID Negative  Diagnosis: COPD exacerbation [facility]) [[id number]]  Admitting Physician: Steffanie Rainwater [[id number]]  Attending Physician: Steffanie Rainwater [[id number]]          B Medical/Surgery History Past Medical History:  Diagnosis Date   Acute renal insufficiency 12/01/2013   Asthma    hx child   Back injury 2014   Fell off ladder and broke back, BP medication being adjusted and passed out while on ladder   Celiac disease    DDD (degenerative disc disease), lumbar    Dehydration 12/01/2013   Elevated PSA    GERD (gastroesophageal reflux disease)    Grade I diastolic dysfunction    High cholesterol    History of kidney  stones    HTN (hypertension) 12/01/2013   Hyperlipidemia 12/01/2013   Hypertension    Hypotension 11/30/2013   Lethargy due to narcotics 12/14/2013   Lumbar radiculopathy 01/01/2014   Malignant neoplasm of prostate (HCC) 10/02/2017   Multiple fractures of ribs of left side 12/01/2013   Neuromuscular disorder (HCC)    Nerve damage due to history of broken back   Prostate cancer (HCC)    Pulmonary hypertension (HCC)    Moderate   Spondylolisthesis at L5-S1 level 09/10/2014   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/2014   Syncope 11/30/2013   T11 vertebral fracture (HCC) 11/30/2013   Traumatic burst fracture of thoracic vertebra (HCC) 12/04/2013   Wears hearing aid    Past Surgical History:  Procedure Laterality Date   BACK SURGERY  06   BIOPSY PROSTATE  08/14/2017   COLONOSCOPY     CYSTOSCOPY/RETROGRADE/URETEROSCOPY/STONE EXTRACTION WITH BASKET Right 11/28/2017   Procedure: RIGHT URETEROSCOPY/STONE EXTRACTION WITH BASKET;  Surgeon: Bjorn Pippin, MD;  Location: [facility];  Service: Urology;  Laterality: Right;   CYSTOSCOPY/URETEROSCOPY/HOLMIUM LASER/STENT PLACEMENT Right 12/23/2018   Procedure: CYSTOSCOPY RIGHT RETROGRADE PYELOGRAM URETEROSCOPY;  Surgeon: Bjorn Pippin, MD;  Location: [facility];  Service: Urology;  Laterality: Right;   EXTRACORPOREAL SHOCK WAVE LITHOTRIPSY Right 10/10/2017   Procedure: RIGHT EXTRACORPOREAL SHOCK WAVE LITHOTRIPSY (ESWL);  Surgeon: Heloise Purpura, MD;  Location: [facility];  Service: Urology;  Laterality: Right;   HERNIA REPAIR Left  teen   TRANSRECTAL ULTRASOUND  08/14/2017     A IV Location/Drains/Wounds Patient Lines/Drains/Airways Status     Active Line/Drains/Airways     Name Placement date Placement time Site Days   Peripheral IV 05/11/23 20 G Left;Posterior Hand 05/11/23  0643  Hand  less than 1   Peripheral IV 05/11/23 18 G Right Antecubital 05/11/23  0643  Antecubital  less than 1   Wound / Incision (Open or Dehisced) 11/30/13 Laceration Head Posterior  abraision to posterior side of head 11/30/13  1245  Head  3449            Intake/Output Last 24 hours No intake or output data in the 24 hours ending 05/11/23 1735  Labs/Imaging Results for orders placed or performed during the hospital encounter of 05/11/23 (from the past 48 hours)  Comprehensive metabolic panel     Status: Abnormal   Collection Time: 05/11/23  6:44 AM  Result Value Ref Range   Sodium 143 135 - 145 mmol/L   Potassium 3.3 (L) 3.5 - 5.1 mmol/L   Chloride 107 98 - 111 mmol/L   CO2 23 22 - 32 mmol/L   Glucose, Bld 122 (H) 70 - 99 mg/dL    Comment: Glucose reference range applies only to samples taken after fasting for at least 8 hours.   BUN 20 8 - 23 mg/dL   Creatinine, Ser 0.27 (H) 0.61 - 1.24 mg/dL   Calcium 8.6 (L) 8.9 - 10.3 mg/dL   Total Protein 6.9 6.5 - 8.1 g/dL   Albumin 3.9 3.5 - 5.0 g/dL   AST 43 (H) 15 - 41 U/L   ALT 30 0 - 44 U/L   Alkaline Phosphatase 75 38 - 126 U/L   Total Bilirubin 0.9 <1.2 mg/dL   GFR, Estimated 48 (L) >60 mL/min    Comment: (NOTE) Calculated using the CKD-EPI Creatinine Equation (2021)    Anion gap 13 5 - 15    Comment: Performed at [facility], [address] [place], [place] [address]  CBC with Differential     Status: None   Collection Time: 05/11/23  6:44 AM  Result Value Ref Range   WBC 10.3 4.0 - 10.5 K/uL   RBC 5.00 4.22 - 5.81 MIL/uL   Hemoglobin 15.0 13.0 - 17.0 g/dL   HCT 44.0 34.7 - 42.5 %   MCV 89.8 80.0 - 100.0 fL   MCH 30.0 26.0 - 34.0 pg   MCHC 33.4 30.0 - 36.0 g/dL   RDW 95.6 38.7 - 56.4 %   Platelets 196 150 - 400 K/uL   nRBC 0.0 0.0 - 0.2 %   Neutrophils Relative % 61 %   Neutro Abs 6.3 1.7 - 7.7 K/uL   Lymphocytes Relative 31 %   Lymphs Abs 3.2 0.7 - 4.0 K/uL   Monocytes Relative 6 %   Monocytes Absolute 0.6 0.1 - 1.0 K/uL   Eosinophils Relative 1 %   Eosinophils Absolute 0.1 0.0 - 0.5 K/uL   Basophils Relative 0 %   Basophils Absolute 0.0 0.0 - 0.1 K/uL   Immature Granulocytes 1 %    Abs Immature Granulocytes 0.07 0.00 - 0.07 K/uL    Comment: Performed at [facility], [address] [place], [place] [address]  Resp panel by RT-PCR (RSV, Flu A&B, Covid) Anterior Nasal Swab     Status: None   Collection Time: 05/11/23  6:44 AM   Specimen: Anterior Nasal Swab  Result Value Ref Range  SARS Coronavirus 2 by RT PCR NEGATIVE NEGATIVE   Influenza A by PCR NEGATIVE NEGATIVE   Influenza B by PCR NEGATIVE NEGATIVE    Comment: (NOTE) The Xpert Xpress SARS-CoV-2/FLU/RSV plus assay is intended as an aid in the diagnosis of influenza from Nasopharyngeal swab specimens and should not be used as a sole basis for treatment. Nasal washings and aspirates are unacceptable for Xpert Xpress SARS-CoV-2/FLU/RSV testing.  Fact Sheet for Patients: BloggerCourse.com  Fact Sheet for Healthcare Providers: SeriousBroker.it  This test is not yet approved or cleared by the [place] FDA and has been authorized for detection and/or diagnosis of SARS-CoV-2 by FDA under an Emergency Use Authorization (EUA). This EUA will remain in effect (meaning this test can be used) for the duration of the COVID-19 declaration under Section 564(b)(1) of the Act, 21 U.S.C. section 360bbb-3(b)(1), unless the authorization is terminated or revoked.     Resp Syncytial Virus by PCR NEGATIVE NEGATIVE    Comment: (NOTE) Fact Sheet for Patients: BloggerCourse.com  Fact Sheet for Healthcare Providers: SeriousBroker.it  This test is not yet approved or cleared by the [place] FDA and has been authorized for detection and/or diagnosis of SARS-CoV-2 by FDA under an Emergency Use Authorization (EUA). This EUA will remain in effect (meaning this test can be used) for the duration of the COVID-19 declaration under Section 564(b)(1) of the Act, 21 U.S.C. section 360bbb-3(b)(1), unless the  authorization is terminated or revoked.  Performed at [facility], [address], [place], [place] [address]   Troponin I (High Sensitivity)     Status: Abnormal   Collection Time: 05/11/23  6:44 AM  Result Value Ref Range   Troponin I (High Sensitivity) 23 (H) <18 ng/L    Comment: (NOTE) Elevated high sensitivity troponin I (hsTnI) values and significant  changes across serial measurements may suggest ACS but many other  chronic and acute conditions are known to elevate hsTnI results.  Refer to the "Links" section for chest pain algorithms and additional  guidance. Performed at [facility], [address], [place], [place] [address]   I-Stat venous blood gas, [facility], [facility], [facility])     Status: Abnormal   Collection Time: 05/11/23  7:30 AM  Result Value Ref Range   pH, Ven 7.329 7.25 - 7.43   pCO2, Ven 51.2 44 - 60 mmHg   pO2, Ven 57 (H) 32 - 45 mmHg   Bicarbonate 26.9 20.0 - 28.0 mmol/L   TCO2 28 22 - 32 mmol/L   O2 Saturation 87 %   Acid-Base Excess 0.0 0.0 - 2.0 mmol/L   Sodium 143 135 - 145 mmol/L   Potassium 3.0 (L) 3.5 - 5.1 mmol/L   Calcium, Ion 1.10 (L) 1.15 - 1.40 mmol/L   HCT 42.0 39.0 - 52.0 %   Hemoglobin 14.3 13.0 - 17.0 g/dL   Sample type VENOUS   Troponin I (High Sensitivity)     Status: Abnormal   Collection Time: 05/11/23  8:56 AM  Result Value Ref Range   Troponin I (High Sensitivity) 57 (H) <18 ng/L    Comment: READ BACK AND VERIFIED WITH C. Threasa Beards RN , @1022 , 05/11/23, [place], T. (NOTE) Elevated high sensitivity troponin I (hsTnI) values and significant  changes across serial measurements may suggest ACS but many other  chronic and acute conditions are known to elevate hsTnI results.  Refer to the "Links" section for chest pain algorithms and additional  guidance. Performed at [facility],  [address], [place], [place] [address]    CT Angio Chest PE W and/or Wo Contrast Result Date: 05/11/2023 CLINICAL DATA:  77 year old male  with history of shortness of breath for the past 2 days. Evaluate for pulmonary embolism. EXAM: CT ANGIOGRAPHY CHEST WITH CONTRAST TECHNIQUE: Multidetector CT imaging of the chest was performed using the standard protocol during bolus administration of intravenous contrast. Multiplanar CT image reconstructions and MIPs were obtained to evaluate the vascular anatomy. RADIATION DOSE REDUCTION: This exam was performed according to the departmental dose-optimization program which includes automated exposure control, adjustment of the mA and/or kV according to patient size and/or use of iterative reconstruction technique. CONTRAST:  65mL OMNIPAQUE IOHEXOL 350 MG/ML SOLN COMPARISON:  None Available. FINDINGS: Cardiovascular: No filling defects are noted in the pulmonary arterial tree to suggest pulmonary embolism. Heart size is mildly enlarged. There is no significant pericardial fluid, thickening or pericardial calcification. There is aortic atherosclerosis, as well as atherosclerosis of the great vessels of the mediastinum and the coronary arteries, including calcified atherosclerotic plaque in the left main, left anterior descending, left circumflex and right coronary arteries. Thickening and calcification of the aortic valve. Mediastinum/Nodes: No pathologically enlarged mediastinal or hilar lymph nodes. Small hiatal hernia. No axillary lymphadenopathy. Lungs/Pleura: No suspicious appearing pulmonary nodules or masses are noted. No acute consolidative airspace disease. No pleural effusions. Upper Abdomen: Aortic atherosclerosis. Musculoskeletal: Chronic appearing compression fracture of T11 with 25% loss of anterior height. There are no aggressive appearing lytic or blastic lesions noted in the visualized portions of the skeleton. Review of the MIP images confirms the above findings. IMPRESSION: 1. No acute findings to account for the patient's symptoms. Specifically, no evidence of pulmonary embolism. 2. Aortic  atherosclerosis, in addition to left main and three-vessel coronary artery disease. Assessment for potential risk factor modification, dietary therapy or pharmacologic therapy may be warranted, if clinically indicated. 3. Mild cardiomegaly. 4. There are calcifications of the aortic valve. Echocardiographic correlation for evaluation of potential valvular dysfunction may be warranted if clinically indicated. Aortic Atherosclerosis (ICD10-I70.0). Electronically Signed   By: Trudie Reed M.D.   On: 05/11/2023 10:14   DG Chest Port 1 View Result Date: 05/11/2023 CLINICAL DATA:  77 year old male with history of shortness of breath. EXAM: PORTABLE CHEST 1 VIEW COMPARISON:  Chest x-ray 10/20/2021. FINDINGS: Lung volumes are normal. No consolidative airspace disease. No pleural effusions. No pneumothorax. No pulmonary nodule or mass noted. Pulmonary vasculature and the cardiomediastinal silhouette are within normal limits. Atherosclerosis in the thoracic aorta. IMPRESSION: 1.  No radiographic evidence of acute cardiopulmonary disease. 2. Aortic atherosclerosis. Electronically Signed   By: Trudie Reed M.D.   On: 05/11/2023 06:55    Pending Labs Unresulted Labs (From admission, onward)     Start     Ordered   05/11/23 1248  Respiratory (~20 pathogens) panel by PCR  (COPD / Pneumonia / Cellulitis / Lower Extremity Wound)  Once,   R        05/11/23 1251   05/11/23 0645  Brain natriuretic peptide  Once,   URGENT        05/11/23 0644            Vitals/Pain Today's Vitals   05/11/23 1145 05/11/23 1200 05/11/23 1500 05/11/23 1700  BP: 105/64 100/66 115/81 119/61  Pulse: 92 92 91 85  Resp: 16 13 15 15   Temp:   (!) 97.5 F (36.4 C)   TempSrc:   Oral   SpO2: 100% 99% 100% 100%  PainSc:        Isolation Precautions Droplet precaution  Medications Medications  azithromycin (ZITHROMAX) 500 mg in sodium chloride 0.9 % 250 mL IVPB (0 mg Intravenous Stopped 05/11/23 1551)    Followed by   azithromycin (ZITHROMAX) tablet 500 mg (has no administration in time range)  enoxaparin (LOVENOX) injection 40 mg (40 mg Subcutaneous Given 05/11/23 1421)  mometasone-formoterol (DULERA) 200-5 MCG/ACT inhaler 2 puff (2 puffs Inhalation Given 05/11/23 1422)  predniSONE (DELTASONE) tablet 40 mg (has no administration in time range)  acetaminophen (TYLENOL) tablet 650 mg (has no administration in time range)    Or  acetaminophen (TYLENOL) suppository 650 mg (has no administration in time range)  senna-docusate (Senokot-S) tablet 1 tablet (has no administration in time range)  ondansetron (ZOFRAN) tablet 4 mg (has no administration in time range)    Or  ondansetron (ZOFRAN) injection 4 mg (has no administration in time range)  ipratropium-albuterol (DUONEB) 0.5-2.5 (3) MG/3ML nebulizer solution 3 mL (has no administration in time range)  aspirin EC tablet 81 mg (81 mg Oral Given 05/11/23 1551)  calcium carbonate (OS-CAL - dosed in mg of elemental calcium) tablet 625 mg (has no administration in time range)  cholecalciferol (VITAMIN D3) 25 MCG (1000 UNIT) tablet 1,000 Units (1,000 Units Oral Given 05/11/23 1551)  cyanocobalamin (VITAMIN B12) tablet 500 mcg (500 mcg Oral Given 05/11/23 1551)  hydrochlorothiazide (HYDRODIURIL) tablet 12.5 mg (12.5 mg Oral Given 05/11/23 1551)  metoprolol succinate (TOPROL-XL) 24 hr tablet 50 mg (50 mg Oral Given 05/11/23 1551)  multivitamin with minerals tablet 1 tablet (1 tablet Oral Given 05/11/23 1551)  omega-3 acid ethyl esters (LOVAZA) capsule 1 g (1 g Oral Given 05/11/23 1551)  pravastatin (PRAVACHOL) tablet 20 mg (20 mg Oral Given 05/11/23 1551)  pregabalin (LYRICA) capsule 225 mg (225 mg Oral Given 05/11/23 1550)  pantoprazole (PROTONIX) EC tablet 40 mg (40 mg Oral Given 05/11/23 1551)  tamsulosin (FLOMAX) capsule 0.4 mg (has no administration in time range)  Vitamin E CAPS 400 Units (400 Units Oral Given 05/11/23 1550)  magnesium sulfate IVPB 2 g 50 mL  (0 g Intravenous Stopped 05/11/23 0818)  sodium chloride 0.9 % bolus 1,000 mL (0 mLs Intravenous Stopped 05/11/23 1057)  iohexol (OMNIPAQUE) 350 MG/ML injection 65 mL (65 mLs Intravenous Contrast Given 05/11/23 0948)    Mobility walks with person assist     Focused Assessments Pulmonary Assessment Handoff:  Lung sounds: L Breath Sounds: Diminished, Expiratory wheezes R Breath Sounds: Diminished, Expiratory wheezes O2 - 2L [place]      R Recommendations: See Admitting Provider Note  Report given to:   Additional Notes:

## 2023-05-11 NOTE — ED Provider Notes (Signed)
[place] EMERGENCY DEPARTMENT AT [facility] Provider Note   CSN: [id number] Arrival date & time: 05/11/23  4098     History  Chief Complaint  Patient presents with   Shortness of Breath    Alexander Cox is a 77 y.o. male with past medical history significant for hypertension, hyperlipidemia, malignant neoplasm of prostate, lumbar radiculopathy, COPD, pulmonary hypertension who presents concern for shortness of breath for 2 days.  Using home albuterol inhaler without relief.  He denies any chest pain.  On EMS arrival at the scene patient with diffuse wheezing, 84% oxygen saturation on room air.  On arrival patient receiving his second DuoNeb, given 125 Solu-Medrol prior to arrival.   Shortness of Breath      Home Medications Prior to Admission medications   Medication Sig Start Date End Date Taking? Authorizing Provider  albuterol (VENTOLIN HFA) 108 (90 Base) MCG/ACT inhaler Inhale 1-2 puffs into the lungs every 6 (six) hours as needed for wheezing or shortness of breath. 02/17/20   Orvil Feil, PA-C  aspirin EC 81 MG tablet Take 81 mg by mouth daily.    [provider]  calcium carbonate (OS-CAL) 600 MG TABS tablet Take 600 mg by mouth daily with breakfast.    [provider]  cholecalciferol (VITAMIN D) 25 MCG (1000 UT) tablet Take 1,000 Units by mouth daily.    [provider]  Coenzyme Q10 (COQ10) 200 MG CAPS Take 200 mg by mouth daily.    [provider]  gabapentin (NEURONTIN) 300 MG capsule Take 300 mg by mouth 4 (four) times daily. 11/25/18   [provider]  hydrochlorothiazide (HYDRODIURIL) 25 MG tablet Take 25 mg by mouth daily.  11/17/13   [provider]  lisinopril (ZESTRIL) 10 MG tablet Take 10 mg by mouth daily. 11/09/18   [provider]  Multiple Vitamin (MULTIVITAMIN WITH MINERALS) TABS tablet Take 1 tablet by mouth daily.    [provider]  Omega-3 Fatty Acids (FISH OIL) 1000  MG CAPS Take 1,000 mg by mouth daily.    [provider]  ondansetron (ZOFRAN) 4 MG tablet Take 1 tablet (4 mg total) by mouth every 8 (eight) hours as needed for nausea or vomiting. 02/17/20   Orvil Feil, PA-C  pravastatin (PRAVACHOL) 10 MG tablet Take 10 mg by mouth daily. 11/23/18   [provider]  RABEprazole (ACIPHEX) 20 MG tablet Take 1 tablet (20 mg total) by mouth 2 (two) times daily. 10/20/21   Jacalyn Lefevre, MD  Tetrahydroz-Polyvinyl Al-Povid (CLEAR EYES TRIPLE ACTION) 0.05-0.5-0.6 % SOLN Place 1 drop into both eyes 3 (three) times daily as needed (dry/irritated eyes.).    [provider]      Allergies    Gluten meal    Review of Systems   Review of Systems  Respiratory:  Positive for shortness of breath.   All other systems reviewed and are negative.   Physical Exam Updated Vital Signs BP 128/73   Pulse 94   Temp 98.8 F (37.1 C) (Oral)   Resp 15   SpO2 100%  Physical Exam Vitals and nursing note reviewed.  Constitutional:      General: He is not in acute distress.    Appearance: Normal appearance.  HENT:     Head: Normocephalic and atraumatic.  Eyes:     General:        Right eye: No discharge.        Left eye: No discharge.  Cardiovascular:     Rate and Rhythm: Regular rhythm. Tachycardia present.     Heart sounds: No murmur heard.    No friction rub. No gallop.  Pulmonary:     Effort: Pulmonary effort is normal. Tachypnea present.     Breath sounds: Normal breath sounds.     Comments: Time my evaluation somewhat diminished bilaterally with intermittent wheezing, no rhonchi, stridor, rales, no focal consolidation.  Work of breathing significantly improved compared to initial EMS report. Abdominal:     General: Bowel sounds are normal.     Palpations: Abdomen is soft.  Skin:    General: Skin is warm and dry.     Capillary Refill: Capillary refill takes less than 2 seconds.  Neurological:     Mental Status: He is alert and  oriented to person, place, and time.  Psychiatric:        Mood and Affect: Mood normal.        Behavior: Behavior normal.     ED Results / Procedures / Treatments   Labs (all labs ordered are listed, but only abnormal results are displayed) Labs Reviewed  COMPREHENSIVE METABOLIC PANEL - Abnormal; Notable for the following components:      Result Value   Potassium 3.3 (*)    Glucose, Bld 122 (*)    Creatinine, Ser 1.50 (*)    Calcium 8.6 (*)    AST 43 (*)    GFR, Estimated 48 (*)    All other components within normal limits  I-STAT VENOUS BLOOD GAS, ED - Abnormal; Notable for the following components:   pO2, Ven 57 (*)    Potassium 3.0 (*)    Calcium, Ion 1.10 (*)    All other components within normal limits  TROPONIN I (HIGH SENSITIVITY) - Abnormal; Notable for the following components:   Troponin I (High Sensitivity) 23 (*)    All other components within normal limits  TROPONIN I (HIGH SENSITIVITY) - Abnormal; Notable for the following components:   Troponin I (High Sensitivity) 57 (*)    All other components within normal limits  RESP PANEL BY RT-PCR (RSV, FLU A&B, COVID)  RVPGX2  CBC WITH DIFFERENTIAL/PLATELET  BRAIN NATRIURETIC PEPTIDE    EKG EKG Interpretation Date/Time:  Saturday May 11 2023 06:40:28 EST Ventricular Rate:  113 PR Interval:  141 QRS Duration:  93 QT Interval:  346 QTC Calculation: 475 R Axis:   39  Text Interpretation: Sinus tachycardia Multiple ventricular premature complexes ST depr, consider ischemia, inferior leads Confirmed by Tilden Fossa [phone]) on 05/11/2023 6:42:37 AM  Radiology CT Angio Chest PE W and/or Wo Contrast Result Date: 05/11/2023 CLINICAL DATA:  77 year old male with history of shortness of breath for the past 2 days. Evaluate for pulmonary embolism. EXAM: CT ANGIOGRAPHY CHEST WITH CONTRAST TECHNIQUE: Multidetector CT imaging of the chest was performed using the standard protocol during bolus administration of  intravenous contrast. Multiplanar CT image reconstructions and MIPs were obtained to evaluate the vascular anatomy. RADIATION DOSE REDUCTION: This exam was performed according to the departmental dose-optimization program which includes automated exposure control, adjustment of the mA and/or kV according to patient size and/or use of iterative reconstruction technique. CONTRAST:  65mL OMNIPAQUE IOHEXOL 350 MG/ML SOLN COMPARISON:  None Available. FINDINGS: Cardiovascular: No filling defects are noted in the pulmonary arterial tree to suggest pulmonary embolism. Heart size is mildly enlarged. There is no significant pericardial fluid, thickening or pericardial calcification. There is aortic atherosclerosis, as well as atherosclerosis of the great vessels  of the mediastinum and the coronary arteries, including calcified atherosclerotic plaque in the left main, left anterior descending, left circumflex and right coronary arteries. Thickening and calcification of the aortic valve. Mediastinum/Nodes: No pathologically enlarged mediastinal or hilar lymph nodes. Small hiatal hernia. No axillary lymphadenopathy. Lungs/Pleura: No suspicious appearing pulmonary nodules or masses are noted. No acute consolidative airspace disease. No pleural effusions. Upper Abdomen: Aortic atherosclerosis. Musculoskeletal: Chronic appearing compression fracture of T11 with 25% loss of anterior height. There are no aggressive appearing lytic or blastic lesions noted in the visualized portions of the skeleton. Review of the MIP images confirms the above findings. IMPRESSION: 1. No acute findings to account for the patient's symptoms. Specifically, no evidence of pulmonary embolism. 2. Aortic atherosclerosis, in addition to left main and three-vessel coronary artery disease. Assessment for potential risk factor modification, dietary therapy or pharmacologic therapy may be warranted, if clinically indicated. 3. Mild cardiomegaly. 4. There are  calcifications of the aortic valve. Echocardiographic correlation for evaluation of potential valvular dysfunction may be warranted if clinically indicated. Aortic Atherosclerosis (ICD10-I70.0). Electronically Signed   By: Trudie Reed M.D.   On: 05/11/2023 10:14   DG Chest Port 1 View Result Date: 05/11/2023 CLINICAL DATA:  77 year old male with history of shortness of breath. EXAM: PORTABLE CHEST 1 VIEW COMPARISON:  Chest x-ray 10/20/2021. FINDINGS: Lung volumes are normal. No consolidative airspace disease. No pleural effusions. No pneumothorax. No pulmonary nodule or mass noted. Pulmonary vasculature and the cardiomediastinal silhouette are within normal limits. Atherosclerosis in the thoracic aorta. IMPRESSION: 1.  No radiographic evidence of acute cardiopulmonary disease. 2. Aortic atherosclerosis. Electronically Signed   By: Trudie Reed M.D.   On: 05/11/2023 06:55    Procedures Procedures    Medications Ordered in ED Medications  magnesium sulfate IVPB 2 g 50 mL (0 g Intravenous Stopped 05/11/23 0818)  sodium chloride 0.9 % bolus 1,000 mL (0 mLs Intravenous Stopped 05/11/23 1057)  iohexol (OMNIPAQUE) 350 MG/ML injection 65 mL (65 mLs Intravenous Contrast Given 05/11/23 0948)    ED Course/ Medical Decision Making/ A&P                                 Medical Decision Making Amount and/or Complexity of Data Reviewed Labs: ordered. Radiology: ordered.  Risk Prescription drug management.   This patient is a 77 y.o. male  who presents to the ED for concern of shob.   Differential diagnoses prior to evaluation: The emergent differential diagnosis includes, but is not limited to,  asthma exacerbation, COPD exacerbation, acute upper respiratory infection, acute bronchitis, chronic bronchitis, interstitial lung disease, ARDS, PE, pneumonia, atypical ACS, carbon monoxide poisoning, spontaneous pneumothorax, new CHF vs CHF exacerbation, versus other . This is not an exhaustive  differential.   Past Medical History / Co-morbidities / Social History: Hypertension, hyperlipidemia, COPD, pulmonary hypertension, prostate cancer  Physical Exam: Physical exam performed. The pertinent findings include: Decreased breath sounds bilaterally, tachycardia, ill-appearing, he has had some low blood pressure in the ED, with systolics in the 80s although improved after IV fluids, current BP 115/61.  Lab Tests/Imaging studies: I personally interpreted labs/imaging and the pertinent results include: CBC unremarkable, RVP negative for COVID, flu, RSV, VBG with no acidosis, his initial troponin is elevated at 23 although may be secondary to demand ischemia from his tachycardia.  His CMP notable for mild hypokalemia, testing 3.3, creatinine is elevated at 1.5, AKI from baseline of around 1.1.Marland Kitchen  Independently interpreted plain film chest x-ray which shows no evidence of acute intrathoracic abnormality, will obtain PE scan given his hypotension, I agree with the radiologist interpretation.  Cardiac monitoring: EKG obtained and interpreted by myself and attending physician which shows: ST depression, inferior leads, does not meet STEMI criteria.   Medications: I ordered medication including fluid bolus for tachycardia, magnesium sulfate for respiratory distress after patient received 2 DuoNebs, Solu-Medrol prior to arrival..  I have reviewed the patients home medicines and have made adjustments as needed.   Consults: I spoke with the hospitalist, Dr. Kirke Corin, who after discussion of his clinical lab work, findings agrees to admission at this time.  Disposition: After consideration of the diagnostic results and the patients response to treatment, I feel that patient would benefit from admission as discussed above .   Final Clinical Impression(s) / ED Diagnoses Final diagnoses:  None    Rx / DC Orders ED Discharge Orders     None         [place] 05/11/23  1111    Tilden Fossa, MD 05/13/23 [phone]

## 2023-05-11 NOTE — ED Notes (Signed)
Pt's BP went down to the 80s. PA made aware. 1L fluids ordered.

## 2023-05-11 NOTE — H&P (Signed)
History and Physical    Patient: Alexander Cox NGE:[id number] DOB: 09-25-1945 DOA: 05/11/2023 DOS: the patient was seen and examined on 05/11/2023 PCP: Alexander Cox  Patient coming from: Home  Chief Complaint:  Chief Complaint  Patient presents with   Shortness of Breath   HPI: Alexander Cox is a 77 y.o. male with medical history significant of COPD/asthma, lumbar radiculopathy and DDD s/p spinal fusion, prostate cancer, HTN, HLD, GERD, PVCs, and chronic diastolic heart failure who presented to the ED for evaluation of shortness of breath. Patient reports she had some difficulty breathing last night and throughout the night. This morning he woke up with shortness of breath and inability to take deep breaths in.  He also reports wheezing, productive cough and some dyspnea on exertion but denied fever, chills, headaches, chest pain, abdominal pain, dizziness or palpitations.  States he has been diagnosed with COPD by the [place] but he quit smoking cigarettes about 50 years ago.  ED course: Initial vitals were temp 98.8, RR 58, HR 117, BP 112/57, SpO2 100% on aerosol mask Labs show WBC 10.3, Hgb 15.0, platelet 196, sodium 143, K+ 3.3, creatinine 1.50, troponin 23-->57, negative flu, COVID and RSV test, pH 7.33, pCO2 51, pO2 57, bicarb 26.9 EKG shows sinus tach with PVCs CXR with no acute cardiopulmonary disease CTA chest PE study negative for PE or any acute findings. Patient received 2 DuoNebs and IV Solu-Medrol 125 mg prior to arrival He received IV mag and 1 L IV NS bolus in the ED TRH was consulted for admission  Review of Systems: As mentioned in the history of present illness. All other systems reviewed and are negative. Past Medical History:  Diagnosis Date   Acute renal insufficiency 12/01/2013   Asthma    hx child   Back injury 2014   Fell off ladder and broke back, BP medication being adjusted and passed out while on ladder   Celiac disease    DDD (degenerative disc  disease), lumbar    Dehydration 12/01/2013   Elevated PSA    GERD (gastroesophageal reflux disease)    Grade I diastolic dysfunction    High cholesterol    History of kidney stones    HTN (hypertension) 12/01/2013   Hyperlipidemia 12/01/2013   Hypertension    Hypotension 11/30/2013   Lethargy due to narcotics 12/14/2013   Lumbar radiculopathy 01/01/2014   Malignant neoplasm of prostate (HCC) 10/02/2017   Multiple fractures of ribs of left side 12/01/2013   Neuromuscular disorder (HCC)    Nerve damage due to history of broken back   Prostate cancer (HCC)    Pulmonary hypertension (HCC)    Moderate   Spondylolisthesis at L5-S1 level 09/10/2014   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/2014   Syncope 11/30/2013   T11 vertebral fracture (HCC) 11/30/2013   Traumatic burst fracture of thoracic vertebra (HCC) 12/04/2013   Wears hearing aid    Past Surgical History:  Procedure Laterality Date   BACK SURGERY  7   BIOPSY PROSTATE  08/14/2017   COLONOSCOPY     CYSTOSCOPY/RETROGRADE/URETEROSCOPY/STONE EXTRACTION WITH BASKET Right 11/28/2017   Procedure: RIGHT URETEROSCOPY/STONE EXTRACTION WITH BASKET;  Surgeon: Bjorn Pippin, Cox;  Location: [facility];  Service: Urology;  Laterality: Right;   CYSTOSCOPY/URETEROSCOPY/HOLMIUM LASER/STENT PLACEMENT Right 12/23/2018   Procedure: CYSTOSCOPY RIGHT RETROGRADE PYELOGRAM URETEROSCOPY;  Surgeon: Bjorn Pippin, Cox;  Location: [facility];  Service: Urology;  Laterality: Right;   EXTRACORPOREAL SHOCK WAVE LITHOTRIPSY Right 10/10/2017   Procedure: RIGHT EXTRACORPOREAL SHOCK  WAVE LITHOTRIPSY (ESWL);  Surgeon: Heloise Purpura, Cox;  Location: [facility];  Service: Urology;  Laterality: Right;   HERNIA REPAIR Left    teen   TRANSRECTAL ULTRASOUND  08/14/2017   Social History:  reports that he quit smoking about 56 years ago. His smoking use included cigarettes. He started smoking about 61 years ago. He has a 2.5 pack-year smoking history. His smokeless tobacco use includes snuff. He  reports current alcohol use. He reports that he does not use drugs.  Allergies  Allergen Reactions   Gluten Meal Other (See Comments)    Upset stomach    Family History  Problem Relation Age of Onset   Hypertension Paternal Grandfather     Prior to Admission medications   Medication Sig Start Date End Date Taking? Authorizing Provider  albuterol (VENTOLIN HFA) 108 (90 Base) MCG/ACT inhaler Inhale 1-2 puffs into the lungs every 6 (six) hours as needed for wheezing or shortness of breath. 02/17/20   Orvil Feil, PA-C  aspirin EC 81 MG tablet Take 81 mg by mouth daily.    Provider, Historical, Cox  calcium carbonate (OS-CAL) 600 MG TABS tablet Take 600 mg by mouth daily with breakfast.    Provider, Historical, Cox  cholecalciferol (VITAMIN D) 25 MCG (1000 UT) tablet Take 1,000 Units by mouth daily.    Provider, Historical, Cox  Coenzyme Q10 (COQ10) 200 MG CAPS Take 200 mg by mouth daily.    Provider, Historical, Cox  gabapentin (NEURONTIN) 300 MG capsule Take 300 mg by mouth 4 (four) times daily. 11/25/18   Provider, Historical, Cox  hydrochlorothiazide (HYDRODIURIL) 25 MG tablet Take 25 mg by mouth daily.  11/17/13   Provider, Historical, Cox  lisinopril (ZESTRIL) 10 MG tablet Take 10 mg by mouth daily. 11/09/18   Provider, Historical, Cox  Multiple Vitamin (MULTIVITAMIN WITH MINERALS) TABS tablet Take 1 tablet by mouth daily.    Provider, Historical, Cox  Omega-3 Fatty Acids (FISH OIL) 1000 MG CAPS Take 1,000 mg by mouth daily.    Provider, Historical, Cox  ondansetron (ZOFRAN) 4 MG tablet Take 1 tablet (4 mg total) by mouth every 8 (eight) hours as needed for nausea or vomiting. 02/17/20   Orvil Feil, PA-C  pravastatin (PRAVACHOL) 10 MG tablet Take 10 mg by mouth daily. 11/23/18   Provider, Historical, Cox  RABEprazole (ACIPHEX) 20 MG tablet Take 1 tablet (20 mg total) by mouth 2 (two) times daily. 10/20/21   Jacalyn Lefevre, Cox  Tetrahydroz-Polyvinyl Al-Povid (CLEAR EYES TRIPLE ACTION)  0.05-0.5-0.6 % SOLN Place 1 drop into both eyes 3 (three) times daily as needed (dry/irritated eyes.).    Provider, Historical, Cox    Physical Exam: Vitals:   05/11/23 0834 05/11/23 0900 05/11/23 0930 05/11/23 1030  BP:  123/62 128/73   Pulse: 100 98 96 94  Resp: 20 17 15 15   Temp:      TempSrc:      SpO2: 100% 100% 99% 100%   General: Pleasant, well-appearing obese male laying in bed. No acute distress. HEENT: [place]/AT. Anicteric sclera CV: Regular rate. Regular rhythm with occasional extra beat. No murmurs, rubs, or gallops. No LE edema Pulmonary: Lungs CTAB. Normal effort. No increased work of breathing. Expiratory wheezes throughout. Abdominal: Soft, nontender, nondistended. Normal bowel sounds. Extremities: Palpable radial and DP pulses. Normal ROM. Skin: Warm and dry. No obvious rash or lesions. Neuro: A&Ox3. Moves all extremities. Normal sensation to light touch. No focal deficit. Psych: Normal mood and affect  Data Reviewed: Labs  show WBC 10.3, Hgb 15.0, platelet 196, sodium 143, K+ 3.3, creatinine 1.50, troponin 23-->57, negative flu, COVID and RSV test, pH 7.33, pCO2 51, pO2 57, bicarb 26.9 EKG shows sinus tach with PVCs CXR with no acute cardiopulmonary disease CTA chest PE study negative for PE or any acute findings.  Assessment and Plan:  JAMASON LANDIS is a 77 y.o. male with medical history significant of COPD/asthma, lumbar radiculopathy and DDD s/p spinal fusion, prostate cancer, HTN, HLD, GERD, PVCs, and chronic diastolic heart failure who presented to the ED for evaluation of shortness of breath and admitted for COPD exacerbation.  # COPD/asthma exacerbation Patient with history of COPD and asthma presenting with 1 day of progressive shortness of breath, wheezing and productive cough found to have evidence of COPD exacerbation. CXR evidence of pneumonia. COVID, flu and RSV negative. Status post IV Solu-Medrol and DuoNebs en route to the hospital. He continues to have  wheezing on lung auscultation but not in any respiratory distress. pCO2 normal at 51.  -Admit for observation overnight -Give prednisone 40 mg daily for additional 4 days -Start 5-day course of azithromycin -Start Dulera 2 puffs twice daily -As needed DuoNebs -Incentive spirometer, flutter valve -F/u full RVP panel  # HTN -Continue HCTZ and metoprolol  # HLD -Continue pravastatin and omega-3  # BPH -Continue tamsulosin  # GERD -Protonix  # Lumbar radiculopathy -Continue Lyrica   Advance Care Planning:   Code Status: Full Code   Consults: None  Family Communication: Discussed admission with family at bedside  Severity of Illness: The appropriate patient status for this patient is OBSERVATION. Observation status is judged to be reasonable and necessary in order to provide the required intensity of service to ensure the patient's safety. The patient's presenting symptoms, physical exam findings, and initial radiographic and laboratory data in the context of their medical condition is felt to place them at decreased risk for further clinical deterioration. Furthermore, it is anticipated that the patient will be medically stable for discharge from the hospital within 2 midnights of admission.   Author: Steffanie Rainwater, Cox 05/11/2023 10:57 AM  For on call review [email or web address].

## 2023-05-11 NOTE — ED Triage Notes (Signed)
Pt arrives via [facility], with reports by them that he has had 2 days of SOB. Pt with hx of COPD, used his albuterol inhaler at home without relief. Denies Pain. On arrival by EMS at the scene, pt having diffuse wheezing, saturations 84% on RA. Pt currently receiving his second duoneb on arrival. IV established in the left hand, he was given solumedrol 125. Noted to be in trigemeny on monitor, no cardiac history reported. Pt reporting some relief with his duoneb

## 2023-05-12 DIAGNOSIS — J441 Chronic obstructive pulmonary disease with (acute) exacerbation: Secondary | ICD-10-CM | POA: Diagnosis not present

## 2023-05-12 MED ORDER — SENNOSIDES-DOCUSATE SODIUM 8.6-50 MG PO TABS: 1.0000 | ORAL_TABLET | Freq: Every evening | ORAL | 0 refills | Status: AC | PRN

## 2023-05-12 MED ORDER — FLUTICASONE PROPIONATE HFA 110 MCG/ACT IN AERO: 2.0000 | INHALATION_SPRAY | Freq: Two times a day (BID) | RESPIRATORY_TRACT | 2 refills | Status: DC

## 2023-05-12 MED ORDER — PREDNISONE 10 MG PO TABS: ORAL_TABLET | ORAL | 0 refills | Status: DC

## 2023-05-12 NOTE — Discharge Summary (Incomplete)
Physician Discharge Summary  Patient ID: Alexander Cox MRN: [id number] DOB/AGE: [birth year] 77 y.o.  Admit date: 05/11/2023 Discharge date: 05/12/2023  Admission Diagnoses:  Discharge Diagnoses:  Principal Problem:   COPD exacerbation (HCC)   Discharged Condition: {condition:18240}  Hospital Course: ***  Consults: {consultation:18241}  Significant Diagnostic Studies: {diagnostics:18242}  Treatments: {Tx:18249}  Discharge Exam: Blood pressure 128/70, pulse 69, temperature (!) 97.5 F (36.4 C), temperature source Oral, resp. rate 17, height 5' 7.01" (1.702 m), weight 81.6 kg, SpO2 99%. {physical OVFI:4332951}  Disposition: Discharge disposition: 01-Home or Self Care       Discharge Instructions     Diet - low sodium heart healthy   Complete by: As directed    Increase activity slowly   Complete by: As directed       Allergies as of 05/12/2023       Reactions   Gluten Meal Other (See Comments)   Upset stomach        Medication List     STOP taking these medications    CoQ10 200 MG Caps   ondansetron 4 MG tablet Commonly known as: Zofran       TAKE these medications    albuterol 108 (90 Base) MCG/ACT inhaler Commonly known as: VENTOLIN HFA Inhale 1-2 puffs into the lungs every 6 (six) hours as needed for wheezing or shortness of breath. What changed:  how much to take when to take this   aspirin EC 81 MG tablet Take 81 mg by mouth daily.   calcium carbonate 600 MG Tabs tablet Commonly known as: OS-CAL Take 600 mg by mouth daily with breakfast.   cholecalciferol 25 MCG (1000 UNIT) tablet Commonly known as: VITAMIN D3 Take 1,000 Units by mouth daily.   cyanocobalamin 500 MCG tablet Commonly known as: VITAMIN B12 Take 500 mcg by mouth daily.   Fish Oil 1000 MG Caps Take 1,000 mg by mouth daily.   fluticasone 110 MCG/ACT inhaler Commonly known as: FLOVENT HFA Inhale 2 puffs into the lungs 2 (two) times daily.    hydrochlorothiazide 25 MG tablet Commonly known as: HYDRODIURIL Take 12.5 mg by mouth daily.   metoprolol succinate 50 MG 24 hr tablet Commonly known as: TOPROL-XL Take 50 mg by mouth daily.   multivitamin with minerals Tabs tablet Take 1 tablet by mouth daily.   pravastatin 10 MG tablet Commonly known as: PRAVACHOL Take 20 mg by mouth daily.   predniSONE 10 MG tablet Commonly known as: DELTASONE Prednisone 40 Mg p.o. once daily for 3 days, then 30 Mg p.o. once daily for 3 days, then 20 Mg p.o. once daily for 3 days and then 10 Mg p.o. once daily for 3 days and stop.   pregabalin 225 MG capsule Commonly known as: LYRICA Take 225 mg by mouth 2 (two) times daily.   RABEprazole 20 MG tablet Commonly known as: ACIPHEX Take 1 tablet (20 mg total) by mouth 2 (two) times daily. What changed: when to take this   senna-docusate 8.6-50 MG tablet Commonly known as: Senokot-S Take 1 tablet by mouth at bedtime as needed for up to 14 days for mild constipation.   tamsulosin 0.4 MG Caps capsule Commonly known as: FLOMAX Take 1 capsule by mouth at bedtime.   Tiotropium Bromide-Olodaterol 2.5-2.5 MCG/ACT Aers Take 2 puffs by mouth daily.   Vitamin E 400 units Tabs Take 1 capsule by mouth daily.         SignedBarnetta Chapel 05/12/2023, 1:23 PM

## 2023-05-21 IMAGING — MR MR LUMBAR SPINE W/O CM
5 series · 44 of 48 positions shown · non-contrast
Comparison: 10/14/2019

CLINICAL DATA: Low back pain with numbness extending down the right
leg

EXAM:
MRI LUMBAR SPINE WITHOUT CONTRAST
TECHNIQUE: Multiplanar, multisequence MR imaging of the lumbar spine was
performed. No intravenous contrast was administered.

[Series 3: T1 · sagittal · 4.0mm · 0.88mm/px · 6 of 15 slices shown (1 of 2)]
[im 1/15]
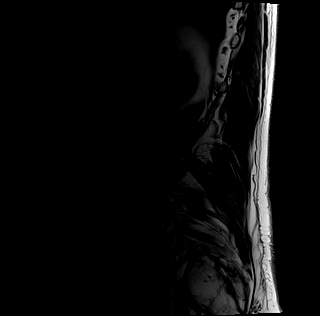
[im 3/15]
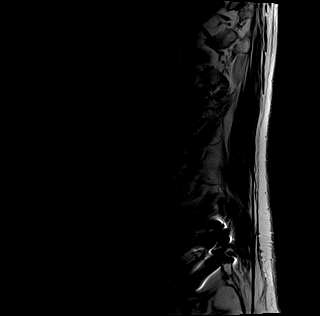
[im 6/15]
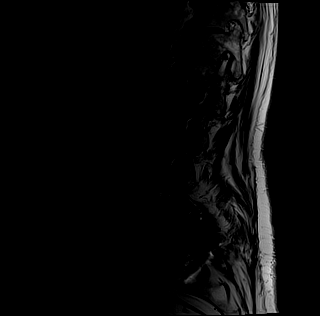
[im 9/15]
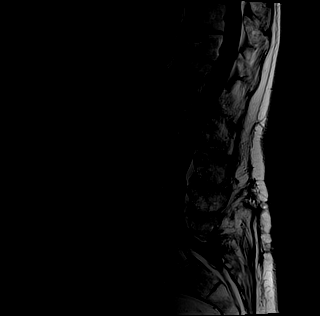
[im 12/15]
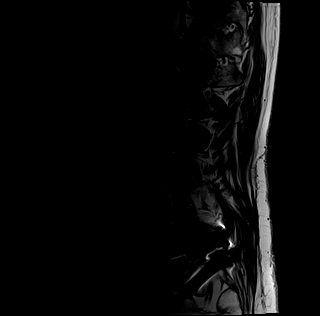
[im 15/15]
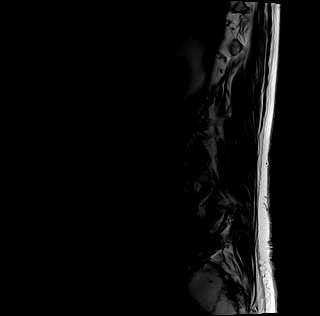

[Series 4: T2 · sagittal · 4.0mm · 0.88mm/px · 6 of 15 slices shown (1 of 2)]
[im 1/15]
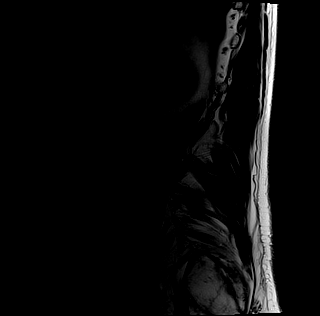
[im 3/15]
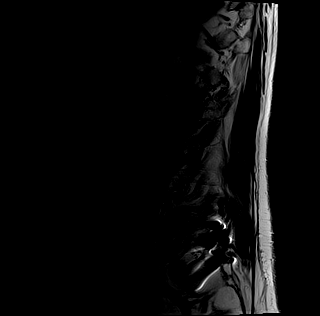
[im 6/15]
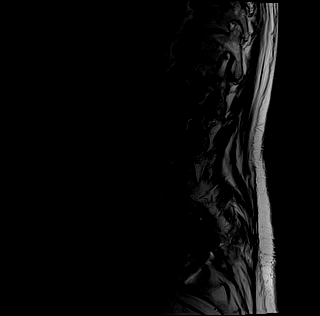
[im 9/15]
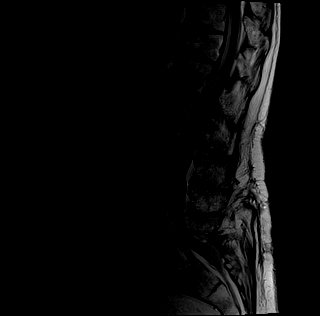
[im 12/15]
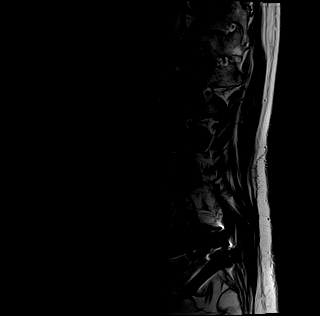
[im 15/15]
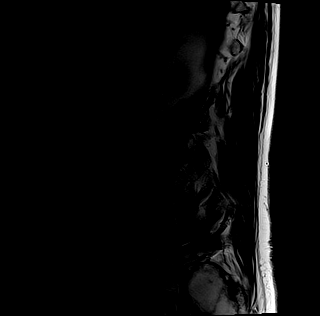

[Series 5: tirm sag · sagittal · 4.0mm · 0.55mm/px · 6 of 15 slices shown]
[im 1/15]
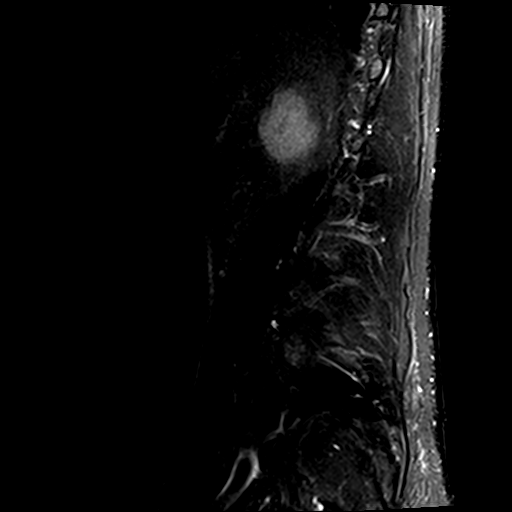
[im 3/15]
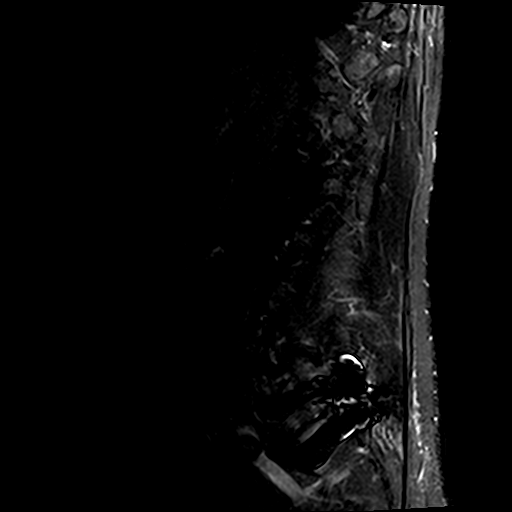
[im 6/15]
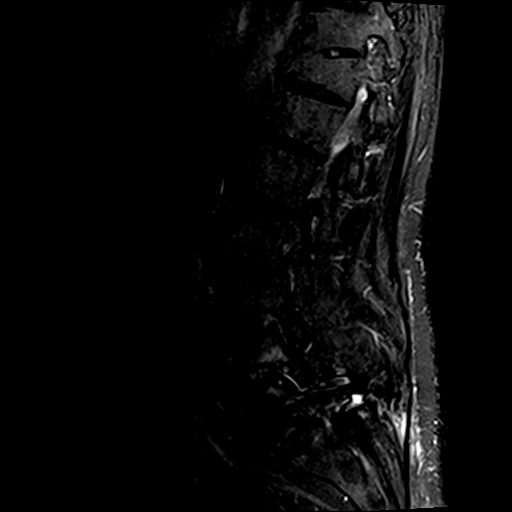
[im 9/15]
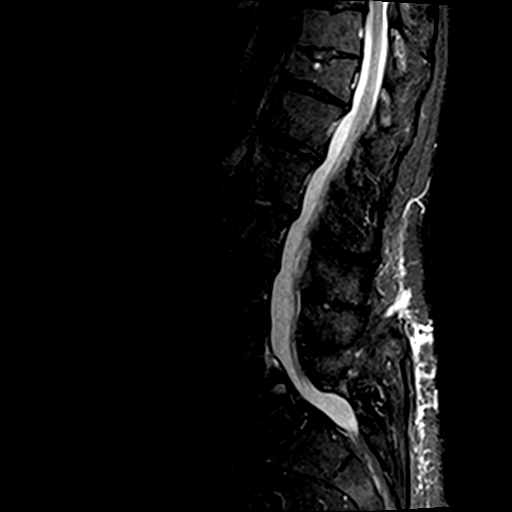
[im 12/15]
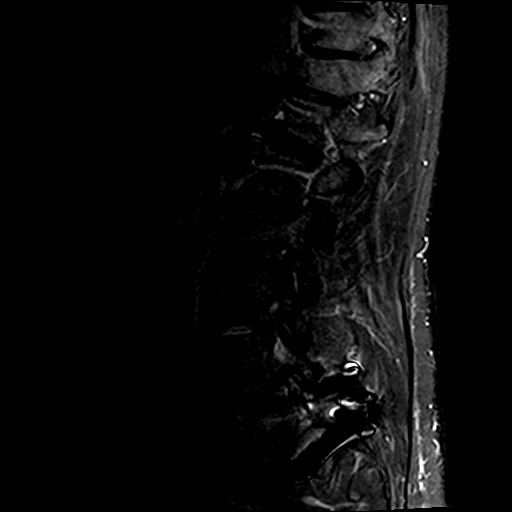
[im 15/15]
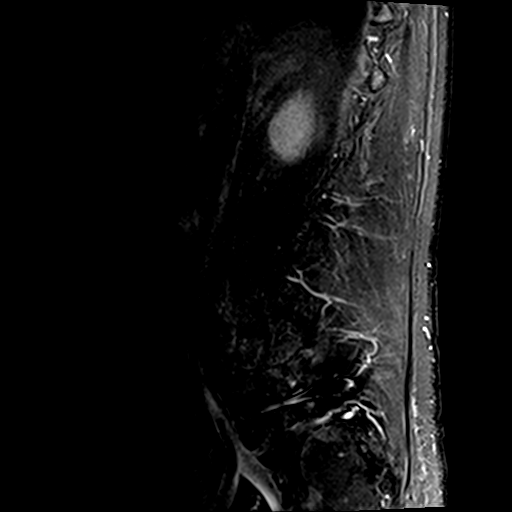

[Series 6: T1 · axial · 4.0mm · 0.70mm/px · z∈[-53,+177]mm · 11 of 40 slices shown (2 of 2)]
[im 1/40]
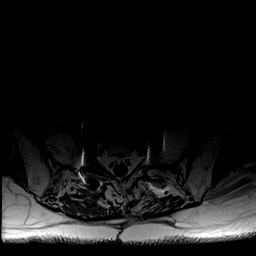
[im 3/40]
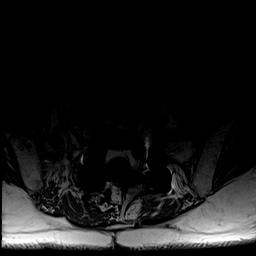
[im 6/40]
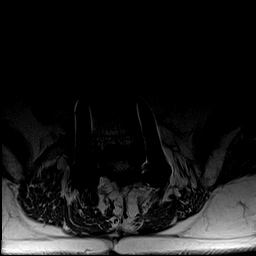
[im 9/40]
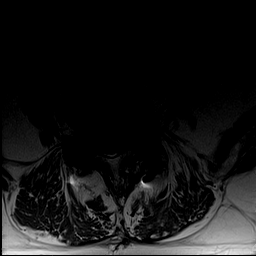
[im 12/40]
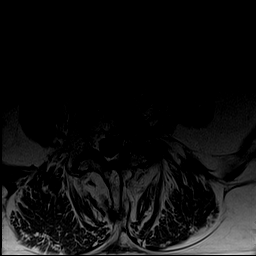
[im 17/40]
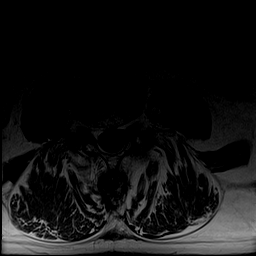
[im 20/40]
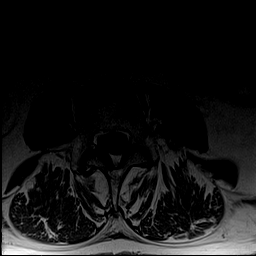
[im 23/40]
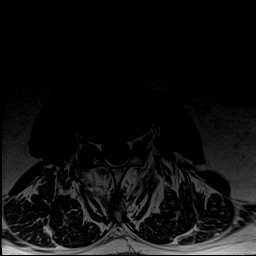
[im 28/40]
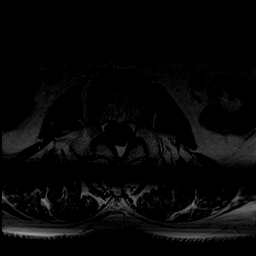
[im 34/40]
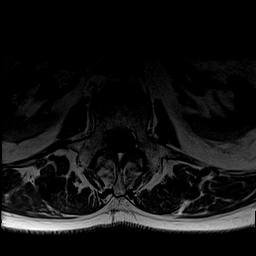
[im 40/40]
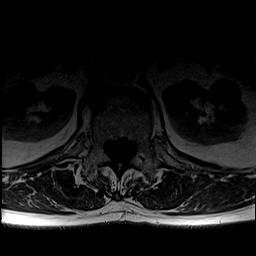

[Series 7: T2 · axial · 4.0mm · 0.70mm/px · z∈[-53,+177]mm · 15 of 40 slices shown (2 of 2)]
[im 1/40]
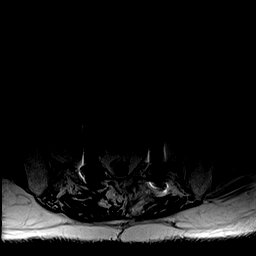
[im 3/40]
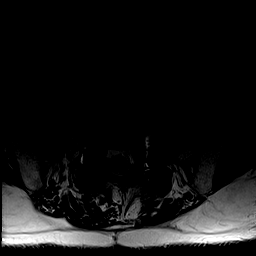
[im 6/40]
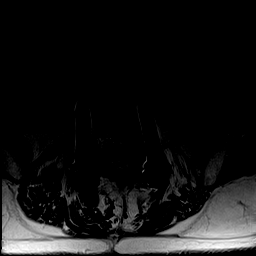
[im 9/40]
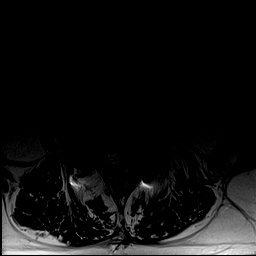
[im 12/40]
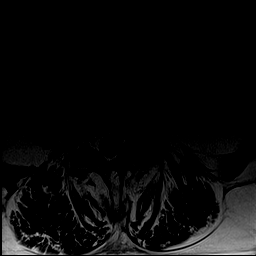
[im 14/40]
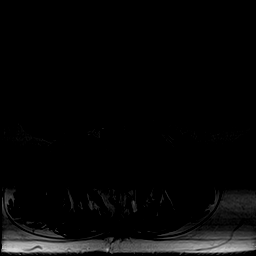
[im 17/40]
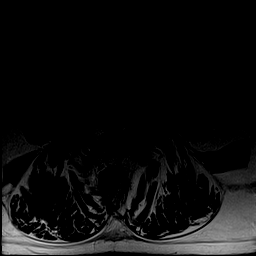
[im 20/40]
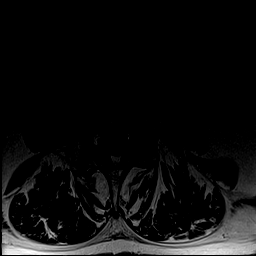
[im 23/40]
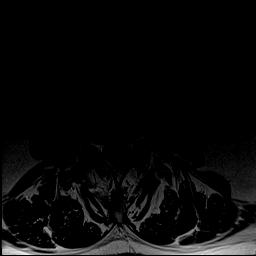
[im 26/40]
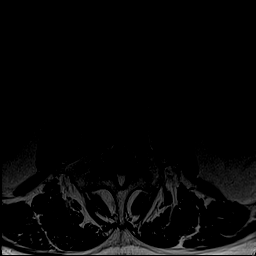
[im 28/40]
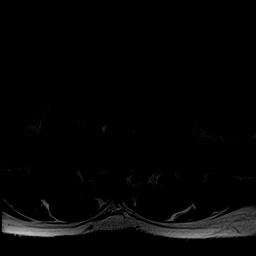
[im 31/40]
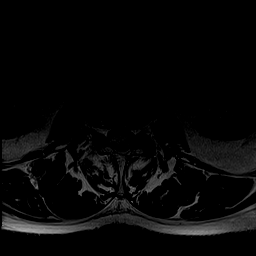
[im 34/40]
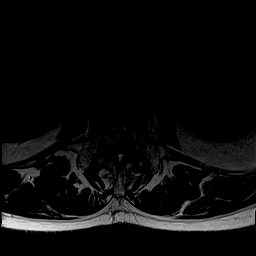
[im 37/40]
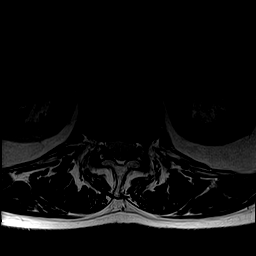
[im 40/40]
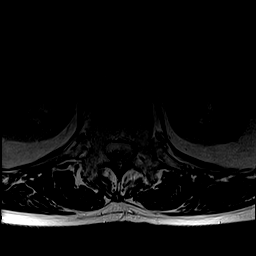

[44 of 48 positions shown; findings below may reference images not displayed]

FINDINGS: Segmentation:  5 lumbar type vertebrae

Alignment: Grade 1 anterolisthesis at L4-5 and L5-S1, fused L5-S1.
Mild scoliosis

Vertebrae: Mild marrow edema at the bilateral L4-5 facets. Remote
T11 compression fracture. L5-S1 solid arthrodesis

Conus medullaris and cauda equina: Conus extends to the L1 level.
Conus and cauda equina appear normal.

Paraspinal and other soft tissues: Atrophy of intrinsic back
muscles.

Disc levels:

T12- L1: Disc narrowing and bulging with small left inferior
foraminal protrusion. No neural compression

L1-L2: Disc narrowing and bulging.  Bilateral facet spurring

L2-L3: Disc narrowing and bulging with bilateral facet spurring

L3-L4: Disc narrowing and bulging with endplate spurring. Bilateral
facet spurring. Biforaminal impingement that is advanced and
progressed on the left.

L4-L5: Advanced facet osteoarthritis with spurring and
anterolisthesis. The disc is narrowed and bulging with probable left
paracentral protrusion. Moderate spinal stenosis. Severe biforaminal
impingement

L5-S1:PLIF with solid arthrodesis.  No visible impingement
IMPRESSION: 1. Advanced lumbar spine degeneration especially affecting inferior
facets with L4-5 anterolisthesis and bilateral active arthritis.
2. Biforaminal impingement at L3-4 and especially L4-5.
3. Moderate spinal stenosis at L4-5.
4. Degenerative changes have progressed from 2229.
5. Solid arthrodesis at L5-S1.

## 2023-05-24 ENCOUNTER — Encounter: Payer: Self-pay | Admitting: Internal Medicine

## 2023-05-24 ENCOUNTER — Ambulatory Visit: Payer: Medicare Other | Admitting: Internal Medicine

## 2023-05-24 VITALS — BP 132/68 | HR 56 | Temp 97.9°F | Ht 66.0 in | Wt 183.0 lb

## 2023-05-24 DIAGNOSIS — J4489 Other specified chronic obstructive pulmonary disease: Secondary | ICD-10-CM | POA: Diagnosis not present

## 2023-05-24 MED ORDER — STIOLTO RESPIMAT 2.5-2.5 MCG/ACT IN AERS: 2.0000 | INHALATION_SPRAY | Freq: Every day | RESPIRATORY_TRACT | Status: DC

## 2023-05-24 NOTE — Patient Instructions (Addendum)
 Plan A = Automatic = Always=   Stiolto 2 puffs first thing in am and chase with flovent  x 2 puffs and repeat the flovent  in evening but  blow flovent  out thru nose.  Work on inhaler technique:  relax and gently blow all the way out then take a nice smooth full deep breath back in, triggering the inhaler at same time you start breathing in.  Hold breath in for at least  5 seconds if you can. Blow out flovent   thru nose. Rinse and gargle with water when done.  If mouth or throat bother you at all,  try brushing teeth/gums/tongue with arm and hammer toothpaste/ make a slurry and gargle and spit out.      Plan B = Backup (to supplement plan A, not to replace it) Only use your albuterol  inhaler as a rescue medication to be used if you can't catch your breath by resting or doing a relaxed purse lip breathing pattern.  - The less you use it, the better it will work when you need it. - Ok to use the inhaler up to 2 puffs  every 4 hours if you must but call for appointment if use goes up over your usual need - Don't leave home without it !!  (think of it like starter fluid)    Also  Ok to try albuterol  15 min before an activity (on alternating days)  that you know would usually make you short of breath and see if it makes any difference and if makes none then don't take albuterol  after activity unless you can't catch your breath as this means it's the resting that helps, not the albuterol .  Rabeprazole  20 mg Take 30-60 min before first meal of the day and pepcid  20 mg at least an hour before bed   GERD (REFLUX)  is an extremely common cause of respiratory symptoms just like yours , many times with no obvious heartburn at all.    It can be treated with medication, but also with lifestyle changes including elevation of the head of your bed (ideally with 6 -8inch blocks under the headboard of your bed),    avoidance of late meals (4 hours before bed), excessive alcohol , and avoid fatty foods, chocolate,  peppermint, colas, red wine, and acidic juices such as orange juice.  NO MINT OR MENTHOL  PRODUCTS SO NO COUGH DROPS  USE SUGARLESS CANDY INSTEAD (Jolley ranchers or Stover's or Life Savers) or even ice chips will also do - the key is to swallow to prevent all throat clearing. NO OIL BASED VITAMINS - use powdered substitutes.  Avoid fish oil when coughing.    Please schedule a follow up visit in 3 months but call sooner if needed - please bring your PFTS with you.

## 2023-05-24 NOTE — Assessment & Plan Note (Signed)
 Quit smoking 1969 but onset of symptoms since 2020  - 05/24/2023  After extensive coaching inhaler device,  effectiveness =    75%  hfa > continue stiolto plus add back flovent  110 2bid    Group D (now reclassified as E) in terms of symptom/risk and laba/lama/ICS  therefore appropriate rx at this point >>>  stiolto plus flovent   and approp saba  Re SABA :  I spent extra time with pt today reviewing appropriate use of albuterol  for prn use on exertion with the following points: 1) saba is for relief of sob that does not improve by walking a slower pace or resting but rather if the pt does not improve after trying this first. 2) If the pt is convinced, as many are, that saba helps recover from activity faster then it's easy to tell if this is the case by re-challenging : ie stop, take the inhaler, then p 5 minutes try the exact same activity (intensity of workload) that just caused the symptoms and see if they are substantially diminished or not after saba 3) if there is an activity that reproducibly causes the symptoms, try the saba 15 min before the activity on alternate days   If in fact the saba really does help, then fine to continue to use it prn but advised may need to look closer at the maintenance regimen being used to achieve better control of airways disease with exertion.   >>> also since has h/o GERD rec max acid suppression with ppi qam ac and h2 hs  and lifestyle changes/ bed blocks reviewed.   F/u 3 m, call sooner if needed          Each maintenance medication was reviewed in detail including emphasizing most importantly the difference between maintenance and prns and under what circumstances the prns are to be triggered using an action plan format where appropriate.  Total time for H and P, chart review, counseling, reviewing hfa/smi  device(s) and generating customized AVS unique to this office visit / same day charting>  60 min new pt eval

## 2023-05-24 NOTE — Progress Notes (Signed)
 Alexander Cox, male    DOB: 12-28-1945   MRN: [id number]   Brief patient profile:  81  yowm [employer]/ [place] vet quit smoking 1969  referred to pulmonary clinic 05/24/2023 by Dr Loreli for eval of dyspnea on inhalers x 2020.   Admit date: 05/11/2023 Discharge date: 05/12/2023   Discharge Diagnoses:  Principal Problem:   COPD exacerbation [facility])     Discharged Condition: stable   Hospital Course: Patient is a 78 year old male with past medical history significant for COPD, asthma, lumbar radiculopathy, prostate cancer, hypertension, hyperlipidemia, chronic diastolic heart failure and PVCs.  Patient was admitted with COPD/asthma exacerbation.  Patient was managed with prednisone , azithromycin  and nebulizer treatment.  Patient has improved significantly.  Patient is eager to be discharged back home.  Patient will follow-up with primary care provider within 1 week of discharge.   # COPD/asthma exacerbation -See above documentation. -Patient presented with 1 day of progressive shortness of breath, wheezing and productive cough-patient was noted to have COPD exacerbation. -CXR was said to suggest evidence of pneumonia.  -COVID, flu and RSV negative.  -Patient was managed with prednisone , nebulizer treatment and antibiotics.     # HTN -Continued HCTZ and metoprolol    # HLD -Continued pravastatin  and omega-3   # BPH -Continued tamsulosin    # GERD -Protonix    # Lumbar radiculopathy -Continued Lyrica           History of Present Illness  05/24/2023  Pulmonary/ 1st office eval/[name] maint on stiolto / finished pred 1 d prior to OV   Chief Complaint  Patient presents with   Consult    Hospitalized for COPD flare 05/11/2023.  No sx noted today.  Dyspnea: baseline x 4 y  mb is downhill and has trouble getting back to house prednisone  seems to make it better  Cough: variable /resolved  Sleep:says make rattling noises  at hs/ bed is flat  2 pillows  SABA use: uses after exerting  l 02 [id number]:[id number]    No obvious day to day or daytime pattern/variability or assoc excess/ purulent sputum or mucus plugs or hemoptysis or cp or chest tightness, subjective wheeze or overt sinus or hb symptoms.    Also denies any obvious fluctuation of symptoms with weather or environmental changes or other aggravating or alleviating factors except as outlined above   No unusual exposure hx or h/o childhood pna/ asthma or knowledge of premature birth.  Current Allergies, Complete Past Medical History, Past Surgical History, Family History, and Social History were reviewed in [employer] record.  ROS  The following are not active complaints unless bolded Hoarseness, sore throat, dysphagia, dental problems, itching, sneezing,  nasal congestion or discharge of excess mucus or purulent secretions, ear ache,   fever, chills, sweats, unintended wt loss or wt gain, classically pleuritic or exertional cp,  orthopnea pnd or arm/hand swelling  or leg swelling, presyncope, palpitations, abdominal pain, anorexia, nausea, vomiting, diarrhea  or change in bowel habits or change in bladder habits, change in stools or change in urine, dysuria, hematuria,  rash, arthralgias, visual complaints, headache, numbness, weakness or ataxia or problems with walking or coordination,  change in mood or  memory.             Outpatient Medications Prior to Visit  Medication Sig Dispense Refill   albuterol  (VENTOLIN  HFA) 108 (90 Base) MCG/ACT inhaler Inhale 1-2 puffs into the lungs every 6 (six) hours as needed for wheezing or shortness of breath. (Patient taking differently:  Inhale 2 puffs into the lungs in the morning, at noon, in the evening, and at bedtime.) 1 each 0   aspirin  EC 81 MG tablet Take 81 mg by mouth daily.     calcium  carbonate (OS-CAL) 600 MG TABS tablet Take 600 mg by mouth daily with breakfast.     cholecalciferol  (VITAMIN D ) 25 MCG (1000 UT) tablet Take 1,000 Units by mouth daily.      cyanocobalamin  (VITAMIN B12) 500 MCG tablet Take 500 mcg by mouth daily.     hydrochlorothiazide  (HYDRODIURIL ) 25 MG tablet Take 12.5 mg by mouth daily.     metoprolol  succinate (TOPROL -XL) 50 MG 24 hr tablet Take 50 mg by mouth daily.     Multiple Vitamin (MULTIVITAMIN WITH MINERALS) TABS tablet Take 1 tablet by mouth daily.     Omega-3 Fatty Acids (FISH OIL) 1000 MG CAPS Take 1,000 mg by mouth daily.     pravastatin  (PRAVACHOL ) 10 MG tablet Take 20 mg by mouth daily.     pregabalin  (LYRICA ) 225 MG capsule Take 225 mg by mouth 2 (two) times daily.     RABEprazole  (ACIPHEX ) 20 MG tablet Take 1 tablet (20 mg total) by mouth 2 (two) times daily. (Patient taking differently: Take 20 mg by mouth daily.) 30 tablet 0   senna-docusate (SENOKOT-S) 8.6-50 MG tablet Take 1 tablet by mouth at bedtime as needed for up to 14 days for mild constipation. 14 tablet 0   tamsulosin  (FLOMAX ) 0.4 MG CAPS capsule Take 1 capsule by mouth at bedtime.     Tiotropium Bromide-Olodaterol 2.5-2.5 MCG/ACT AERS Take 2 puffs by mouth daily.     Vitamin E  400 units TABS Take 1 capsule by mouth daily.     fluticasone  (FLOVENT  HFA) 110 MCG/ACT inhaler Inhale 2 puffs into the lungs 2 (two) times daily. (Patient not taking: Reported on 05/24/2023) 1 each 2   predniSONE  (DELTASONE ) 10 MG tablet Prednisone  40 Mg p.o. once daily for 3 days, then 30 Mg p.o. once daily for 3 days, then 20 Mg p.o. once daily for 3 days and then 10 Mg p.o. once daily for 3 days and stop. (Patient not taking: Reported on 05/24/2023) 30 tablet 0   No facility-administered medications prior to visit.    Past Medical History:  Diagnosis Date   Acute renal insufficiency 12/01/2013   Asthma    hx child   Back injury 2014   Fell off ladder and broke back, BP medication being adjusted and passed out while on ladder   Celiac disease    DDD (degenerative disc disease), lumbar    Dehydration 12/01/2013   Elevated PSA    GERD (gastroesophageal reflux disease)     Grade I diastolic dysfunction    High cholesterol    History of kidney stones    HTN (hypertension) 12/01/2013   Hyperlipidemia 12/01/2013   Hypertension    Hypotension 11/30/2013   Lethargy due to narcotics 12/14/2013   Lumbar radiculopathy 01/01/2014   Malignant neoplasm of prostate (HCC) 10/02/2017   Multiple fractures of ribs of left side 12/01/2013   Neuromuscular disorder (HCC)    Nerve damage due to history of broken back   Prostate cancer (HCC)    Pulmonary hypertension (HCC)    Moderate   Spondylolisthesis at L5-S1 level 09/10/2014   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/2014   Syncope 11/30/2013   T11 vertebral fracture (HCC) 11/30/2013   Traumatic burst fracture of thoracic vertebra (HCC) 12/04/2013   Wears hearing aid  Objective:     BP 132/68 (BP Location: Left Arm, Patient Position: Sitting, Cuff Size: Normal)   Pulse (!) 56   Temp 97.9 F (36.6 C) (Oral)   Ht 5' 6 (1.676 m)   Wt 183 lb (83 kg)   SpO2 98%   BMI 29.54 kg/m   SpO2: 98 % RA   Hoarse amb wm nad    HEENT : Oropharynx  clear   Nasal turbinates nl    NECK :  without  apparent JVD/ palpable Nodes/TM    LUNGS: no acc muscle use,  Min barrel  contour chest wall with bilateral  slightly decreased bs s audible wheeze and  without cough on insp or exp maneuvers and min  Hyperresonant  to  percussion bilaterally    CV:  RRR  no s3 or murmur or increase in P2, and no edema   ABD:  soft and nontender with pos end  insp Hoover's  in the supine position.  No bruits or organomegaly appreciated   MS:  Nl gait/ ext warm without deformities Or obvious joint restrictions  calf tenderness, cyanosis or clubbing     SKIN: warm and dry without lesions    NEURO:  alert, approp, nl sensorium with  no motor or cerebellar deficits apparent.        I personally reviewed images and agree with radiology impression as follows:   Chest CTa    05/11/23  1. No acute findings to account for the patient's  symptoms. Specifically, no evidence of pulmonary embolism. 2. Aortic atherosclerosis, in addition to left main and three-vessel coronary artery disease. Assessment for potential risk factor modification, dietary therapy or pharmacologic therapy may be warranted, if clinically indicated. 3. Mild cardiomegaly. 4. There are calcifications of the aortic valve. Echocardiographic correlation for evaluation of potential valvular dysfunction may be warranted if clinically indicated.      Assessment   Asthmatic bronchitis , chronic (HCC) Quit smoking 1969 but onset of symptoms since 2020  - 05/24/2023  After extensive coaching inhaler device,  effectiveness =    75%  hfa > continue stiolto plus add back flovent  110 2bid    Group D (now reclassified as E) in terms of symptom/risk and laba/lama/ICS  therefore appropriate rx at this point >>>  stiolto plus flovent   and approp saba  Re SABA :  I spent extra time with pt today reviewing appropriate use of albuterol  for prn use on exertion with the following points: 1) saba is for relief of sob that does not improve by walking a slower pace or resting but rather if the pt does not improve after trying this first. 2) If the pt is convinced, as many are, that saba helps recover from activity faster then it's easy to tell if this is the case by re-challenging : ie stop, take the inhaler, then p 5 minutes try the exact same activity (intensity of workload) that just caused the symptoms and see if they are substantially diminished or not after saba 3) if there is an activity that reproducibly causes the symptoms, try the saba 15 min before the activity on alternate days   If in fact the saba really does help, then fine to continue to use it prn but advised may need to look closer at the maintenance regimen being used to achieve better control of airways disease with exertion.   >>> also since has h/o GERD rec max acid suppression with ppi qam ac and h2 hs  and  lifestyle changes/ bed blocks reviewed.   F/u 3 m, call sooner if needed          Each maintenance medication was reviewed in detail including emphasizing most importantly the difference between maintenance and prns and under what circumstances the prns are to be triggered using an action plan format where appropriate.  Total time for H and P, chart review, counseling, reviewing hfa/smi  device(s) and generating customized AVS unique to this office visit / same day charting>  60 min new pt eval          Ozell America, MD 05/24/2023

## 2023-07-09 DIAGNOSIS — K080 Exfoliation of teeth due to systemic causes: Secondary | ICD-10-CM | POA: Diagnosis not present

## 2023-08-01 NOTE — Progress Notes (Unsigned)
 Alexander Cox, male    DOB: [birth year]   MRN: [id number]   Brief patient profile:  48  y[employer]/ [place] vet quit smoking 1969  referred to pulmonary clinic 05/24/2023 by Dr Clelia Croft for eval of dyspnea on inhalers x 2020.   Admit date: 05/11/2023 Discharge date: 05/12/2023   Discharge Diagnoses:  Principal Problem:   COPD exacerbation [facility])     Discharged Condition: stable   Hospital Course: Patient is a 78 year old male with past medical history significant for COPD, asthma, lumbar radiculopathy, prostate cancer, hypertension, hyperlipidemia, chronic diastolic heart failure and PVCs.  Patient was admitted with COPD/asthma exacerbation.  Patient was managed with prednisone, azithromycin and nebulizer treatment.  Patient has improved significantly.  Patient is eager to be discharged back home.  Patient will follow-up with primary care provider within 1 week of discharge.   # COPD/asthma exacerbation -See above documentation. -Patient presented with 1 day of progressive shortness of breath, wheezing and productive cough-patient was noted to have COPD exacerbation. -CXR was said to suggest evidence of pneumonia.  -COVID, flu and RSV negative.  -Patient was managed with prednisone, nebulizer treatment and antibiotics.     # HTN -Continued HCTZ and metoprolol   # HLD -Continued pravastatin and omega-3   # BPH -Continued tamsulosin   # GERD -Protonix   # Lumbar radiculopathy -Continued Lyrica          History of Present Illness  05/24/2023  Pulmonary/ 1st office eval/Alexander Cox maint on stiolto / finished pred 1 d prior to OV   Chief Complaint  Patient presents with   Consult    Hospitalized for COPD flare 05/11/2023.  No sx noted today.  Dyspnea: baseline x 4 y  mb is downhill and has trouble getting back to house prednisone seems to make it better  Cough: variable Alexander Cox  Sleep:says "make rattling noises"  at hs/ bed is flat  2 pillows  SABA use: uses after exerting  l 02 NWG:NFAO REC Plan A = Automatic = Always=   Stiolto 2 puffs first thing in am and chase with flovent x 2 puffs and repeat the flovent in evening but  blow flovent out thru nose. Work on inhaler technique: Plan B = Backup (to supplement plan A, not to replace it) Only use your albuterol inhaler as a rescue medication   Also  Ok to try albuterol 15 min before an activity (on alternating days)  that you know would usually make you short of breath  Rabeprazole 20 mg Take 30-60 min before first meal of the day and pepcid 20 mg at least an hour before bed GERD diet reviewed, bed blocks rec   Please schedule a follow up visit in 3 months but call sooner if needed - please bring your PFTS with you.       08/02/2023  f/u ov/Alexander Cox re: GOLD 2 copd  maint on stiolto / asmanex 200  Chief Complaint  Patient presents with   Follow-up    Breathing is about the same. Has not noticed"rattling" as much as night. No new co's.   Dyspnea:  mb back to house  Cough: better  Sleeping: elevate on bed blocks s  resp cc  SABA use: none  02: none       No obvious day to day or daytime variability or assoc excess/ purulent sputum or mucus plugs or hemoptysis or cp or chest tightness, subjective wheeze or overt  hb symptoms.    Also denies any obvious  fluctuation of symptoms with weather or environmental changes or other aggravating or alleviating factors except as outlined above   No unusual exposure hx or h/o childhood pna/ asthma or knowledge of premature birth.  Current Allergies, Complete Past Medical History, Past Surgical History, Family History, and Social History were reviewed in [employer] record.  ROS  The following are not active complaints unless bolded Hoarseness, sore throat, dysphagia, dental problems, itching, sneezing,  nasal congestion or discharge of excess mucus or purulent secretions, ear ache,   fever, chills, sweats, unintended wt loss or wt gain, classically  pleuritic or exertional cp,  orthopnea pnd or arm/hand swelling  or leg swelling, presyncope, palpitations, abdominal pain, anorexia, nausea, vomiting, diarrhea  or change in bowel habits or change in bladder habits, change in stools or change in urine, dysuria, hematuria,  rash, arthralgias, visual complaints, headache, numbness, weakness or ataxia or problems with walking or coordination,  change in mood or  memory.        Current Meds  Medication Sig   albuterol (VENTOLIN HFA) 108 (90 Base) MCG/ACT inhaler Inhale 1-2 puffs into the lungs every 6 (six) hours as needed for wheezing or shortness of breath. (Patient taking differently: Inhale 2 puffs into the lungs in the morning, at noon, in the evening, and at bedtime.)   aspirin EC 81 MG tablet Take 81 mg by mouth daily.   calcium carbonate (OS-CAL) 600 MG TABS tablet Take 600 mg by mouth daily with breakfast.   cholecalciferol (VITAMIN D) 25 MCG (1000 UT) tablet Take 1,000 Units by mouth daily.   cyanocobalamin (VITAMIN B12) 500 MCG tablet Take 500 mcg by mouth daily.   hydrochlorothiazide (HYDRODIURIL) 25 MG tablet Take 12.5 mg by mouth daily.   metoprolol succinate (TOPROL-XL) 50 MG 24 hr tablet Take 50 mg by mouth daily.   Mometasone Furoate (ASMANEX HFA) 200 MCG/ACT AERO Inhale 1 puff into the lungs in the morning and at bedtime.   Multiple Vitamin (MULTIVITAMIN WITH MINERALS) TABS tablet Take 1 tablet by mouth daily.   Omega-3 Fatty Acids (FISH OIL) 1000 MG CAPS Take 1,000 mg by mouth daily.   omeprazole (PRILOSEC) 20 MG capsule Take 20 mg by mouth daily.   pravastatin (PRAVACHOL) 10 MG tablet Take 20 mg by mouth daily.   pregabalin (LYRICA) 225 MG capsule Take 225 mg by mouth 2 (two) times daily.   tamsulosin (FLOMAX) 0.4 MG CAPS capsule Take 1 capsule by mouth at bedtime.   Tiotropium Bromide-Olodaterol 2.5-2.5 MCG/ACT AERS Take 2 puffs by mouth daily.   Vitamin E 400 units TABS Take 1 capsule by mouth daily.        Past Medical  History:  Diagnosis Date   Acute renal insufficiency 12/01/2013   Asthma    hx child   Back injury 2014   Fell off ladder and broke back, BP medication being adjusted and passed out while on ladder   Celiac disease    DDD (degenerative disc disease), lumbar    Dehydration 12/01/2013   Elevated PSA    GERD (gastroesophageal reflux disease)    Grade I diastolic dysfunction    High cholesterol    History of kidney stones    HTN (hypertension) 12/01/2013   Hyperlipidemia 12/01/2013   Hypertension    Hypotension 11/30/2013   Lethargy due to narcotics 12/14/2013   Lumbar radiculopathy 01/01/2014   Malignant neoplasm of prostate (HCC) 10/02/2017   Multiple fractures of ribs of left side 12/01/2013   Neuromuscular  disorder [facility])    Nerve damage due to history of broken back   Prostate cancer [facility])    Pulmonary hypertension (HCC)    Moderate   Spondylolisthesis at L5-S1 level 09/10/2014   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/2014   Syncope 11/30/2013   T11 vertebral fracture (HCC) 11/30/2013   Traumatic burst fracture of thoracic vertebra (HCC) 12/04/2013   Wears hearing aid       Objective:    Wt Readings from Last 3 Encounters:  08/02/23 187 lb (84.8 kg)  05/24/23 183 lb (83 kg)  05/12/23 179 lb 14.3 oz (81.6 kg)      Vital signs reviewed  08/02/2023  - Note at rest 02 sats  99% on RA   General appearance:    amb pleasant wm nad      HEENT : Oropharynx  clear   Nasal turbinates nl   NECK :  without  apparent JVD/ palpable Nodes/TM    LUNGS: no acc muscle use,  Min barrel  contour chest wall with bilateral  slightly decreased bs s audible wheeze and  without cough on insp or exp maneuvers and min  Hyperresonant  to  percussion bilaterally    CV:  RRR  no s3 or murmur or increase in P2, and no edema   ABD:  soft and nontender with pos end  insp Hoover's  in the supine position.  No bruits or organomegaly appreciated   MS:  Nl gait/ ext warm without deformities Or obvious  joint restrictions  calf tenderness, cyanosis or clubbing     SKIN: warm and dry without lesions    NEURO:  alert, approp, nl sensorium with  no motor or cerebellar deficits apparent.          I personally reviewed images and agree with radiology impression as follows:   Chest CTa    05/11/23  1. No acute findings to account for the patient's symptoms. Specifically, no evidence of pulmonary embolism. 2. Aortic atherosclerosis, in addition to left main and three-vessel coronary artery disease. Assessment for potential risk factor modification, dietary therapy or pharmacologic therapy may be warranted, if clinically indicated. 3. Mild cardiomegaly. 4. There are calcifications of the aortic valve. Echocardiographic correlation for evaluation of potential valvular dysfunction may be warranted if clinically indicated.      Assessment

## 2023-08-02 ENCOUNTER — Ambulatory Visit: Payer: Medicare Other | Admitting: Internal Medicine

## 2023-08-02 ENCOUNTER — Encounter: Payer: Self-pay | Admitting: Internal Medicine

## 2023-08-02 VITALS — BP 116/68 | HR 64 | Ht 66.0 in | Wt 187.0 lb

## 2023-08-02 DIAGNOSIS — J4489 Other specified chronic obstructive pulmonary disease: Secondary | ICD-10-CM | POA: Diagnosis not present

## 2023-08-02 DIAGNOSIS — Z87891 Personal history of nicotine dependence: Secondary | ICD-10-CM | POA: Diagnosis not present

## 2023-08-02 NOTE — Patient Instructions (Signed)
 Work on Cabin crew inhaler technique:  relax and gently blow all the way out then take a nice smooth full deep breath back in, triggering the inhaler at same time you start breathing in.  Hold breath in for at least  5 seconds if you can. Blow out Asmanex  thru nose. Rinse and gargle with water when done.  If mouth or throat bother you at all,  try brushing teeth/gums/tongue with arm and hammer toothpaste/ make a slurry and gargle and spit out.   Please schedule a follow up visit in 12 months but call sooner if needed

## 2023-08-02 NOTE — Assessment & Plan Note (Addendum)
 Quit smoking 1969 but onset of symptoms since 2020  - PFT's  06/08/21  FEV1 1.83 (69.5 % ) ratio 0.58  p 15 % improvement from saba p ? prior to study with DLCO  17.7 (75%)   and FV curve classically concave with truncated insp curve s tru plateau  - 05/24/2023  continue stiolto plus add back flovent 110 2bid   = asmanex 200 per [facility] -  08/02/2023  After extensive coaching inhaler device,  effectiveness =    75% (short ti)    Group D (now reclassified as E) in terms of symptom/risk and laba/lama/ICS  therefore appropriate rx at this point >>>  continue stiolto and asmanex 200 thru [place] formulary/ restrictions/ reviewed   F/u q 12 m , sooner prn    Each maintenance medication was reviewed in detail including emphasizing most importantly the difference between maintenance and prns and under what circumstances the prns are to be triggered using an action plan format where appropriate.  Total time for H and P, chart review, counseling, reviewing hfa/smi device(s) and generating customized AVS unique to this office visit / same day charting = 32 min summary f/u

## 2023-09-09 DIAGNOSIS — N183 Chronic kidney disease, stage 3 unspecified: Secondary | ICD-10-CM | POA: Diagnosis not present

## 2023-09-09 DIAGNOSIS — Z0000 Encounter for general adult medical examination without abnormal findings: Secondary | ICD-10-CM | POA: Diagnosis not present

## 2023-09-09 DIAGNOSIS — R7303 Prediabetes: Secondary | ICD-10-CM | POA: Diagnosis not present

## 2023-09-09 DIAGNOSIS — E7800 Pure hypercholesterolemia, unspecified: Secondary | ICD-10-CM | POA: Diagnosis not present

## 2023-09-09 DIAGNOSIS — J449 Chronic obstructive pulmonary disease, unspecified: Secondary | ICD-10-CM | POA: Diagnosis not present

## 2023-09-09 DIAGNOSIS — I10 Essential (primary) hypertension: Secondary | ICD-10-CM | POA: Diagnosis not present

## 2023-09-09 DIAGNOSIS — K2270 Barrett's esophagus without dysplasia: Secondary | ICD-10-CM | POA: Diagnosis not present

## 2023-09-09 DIAGNOSIS — Z1331 Encounter for screening for depression: Secondary | ICD-10-CM | POA: Diagnosis not present

## 2023-10-14 DIAGNOSIS — Z8546 Personal history of malignant neoplasm of prostate: Secondary | ICD-10-CM | POA: Diagnosis not present

## 2023-10-21 DIAGNOSIS — N403 Nodular prostate with lower urinary tract symptoms: Secondary | ICD-10-CM | POA: Diagnosis not present

## 2023-10-21 DIAGNOSIS — Z8546 Personal history of malignant neoplasm of prostate: Secondary | ICD-10-CM | POA: Diagnosis not present

## 2023-10-21 DIAGNOSIS — N3941 Urge incontinence: Secondary | ICD-10-CM | POA: Diagnosis not present

## 2023-10-21 DIAGNOSIS — R351 Nocturia: Secondary | ICD-10-CM | POA: Diagnosis not present

## 2023-11-20 DIAGNOSIS — Z8546 Personal history of malignant neoplasm of prostate: Secondary | ICD-10-CM | POA: Diagnosis not present

## 2023-11-20 DIAGNOSIS — R351 Nocturia: Secondary | ICD-10-CM | POA: Diagnosis not present

## 2023-12-10 DIAGNOSIS — K080 Exfoliation of teeth due to systemic causes: Secondary | ICD-10-CM | POA: Diagnosis not present

## 2024-01-06 DIAGNOSIS — H26493 Other secondary cataract, bilateral: Secondary | ICD-10-CM | POA: Diagnosis not present

## 2024-01-06 DIAGNOSIS — Z961 Presence of intraocular lens: Secondary | ICD-10-CM | POA: Diagnosis not present

## 2024-01-06 DIAGNOSIS — G43809 Other migraine, not intractable, without status migrainosus: Secondary | ICD-10-CM | POA: Diagnosis not present

## 2024-01-06 DIAGNOSIS — D3132 Benign neoplasm of left choroid: Secondary | ICD-10-CM | POA: Diagnosis not present

## 2024-01-06 DIAGNOSIS — D3131 Benign neoplasm of right choroid: Secondary | ICD-10-CM | POA: Diagnosis not present

## 2024-01-07 DIAGNOSIS — K080 Exfoliation of teeth due to systemic causes: Secondary | ICD-10-CM | POA: Diagnosis not present

## 2024-02-28 DIAGNOSIS — E7800 Pure hypercholesterolemia, unspecified: Secondary | ICD-10-CM | POA: Diagnosis not present

## 2024-02-28 DIAGNOSIS — Z01818 Encounter for other preprocedural examination: Secondary | ICD-10-CM | POA: Diagnosis not present

## 2024-02-28 DIAGNOSIS — N1831 Chronic kidney disease, stage 3a: Secondary | ICD-10-CM | POA: Diagnosis not present

## 2024-02-28 DIAGNOSIS — I10 Essential (primary) hypertension: Secondary | ICD-10-CM | POA: Diagnosis not present

## 2024-02-28 DIAGNOSIS — R7303 Prediabetes: Secondary | ICD-10-CM | POA: Diagnosis not present

## 2024-03-03 DIAGNOSIS — M810 Age-related osteoporosis without current pathological fracture: Secondary | ICD-10-CM | POA: Diagnosis not present
